# Patient Record
Sex: Female | Born: 1940 | Race: White | Hispanic: No | Smoking: Never smoker
Health system: Southern US, Community
[De-identification: ages and names within clinical notes are randomized; demographics above are authoritative.]

## PROBLEM LIST (undated history)

## (undated) DIAGNOSIS — J42 Unspecified chronic bronchitis: Secondary | ICD-10-CM

## (undated) DIAGNOSIS — R351 Nocturia: Secondary | ICD-10-CM

## (undated) DIAGNOSIS — R413 Other amnesia: Secondary | ICD-10-CM

## (undated) DIAGNOSIS — R197 Diarrhea, unspecified: Secondary | ICD-10-CM

## (undated) DIAGNOSIS — C3430 Malignant neoplasm of lower lobe, unspecified bronchus or lung: Secondary | ICD-10-CM

## (undated) DIAGNOSIS — M2540 Effusion, unspecified joint: Secondary | ICD-10-CM

## (undated) DIAGNOSIS — T4145XA Adverse effect of unspecified anesthetic, initial encounter: Secondary | ICD-10-CM

## (undated) DIAGNOSIS — M1990 Unspecified osteoarthritis, unspecified site: Secondary | ICD-10-CM

## (undated) DIAGNOSIS — M2550 Pain in unspecified joint: Secondary | ICD-10-CM

## (undated) DIAGNOSIS — C4491 Basal cell carcinoma of skin, unspecified: Secondary | ICD-10-CM

## (undated) DIAGNOSIS — N189 Chronic kidney disease, unspecified: Secondary | ICD-10-CM

## (undated) DIAGNOSIS — R112 Nausea with vomiting, unspecified: Secondary | ICD-10-CM

## (undated) DIAGNOSIS — Z8619 Personal history of other infectious and parasitic diseases: Secondary | ICD-10-CM

## (undated) DIAGNOSIS — G4700 Insomnia, unspecified: Secondary | ICD-10-CM

## (undated) DIAGNOSIS — R51 Headache: Secondary | ICD-10-CM

## (undated) DIAGNOSIS — R531 Weakness: Secondary | ICD-10-CM

## (undated) DIAGNOSIS — R918 Other nonspecific abnormal finding of lung field: Secondary | ICD-10-CM

## (undated) DIAGNOSIS — Z9889 Other specified postprocedural states: Secondary | ICD-10-CM

## (undated) DIAGNOSIS — Z8489 Family history of other specified conditions: Secondary | ICD-10-CM

## (undated) DIAGNOSIS — J189 Pneumonia, unspecified organism: Secondary | ICD-10-CM

## (undated) DIAGNOSIS — I10 Essential (primary) hypertension: Secondary | ICD-10-CM

## (undated) DIAGNOSIS — K5900 Constipation, unspecified: Secondary | ICD-10-CM

## (undated) DIAGNOSIS — G2581 Restless legs syndrome: Secondary | ICD-10-CM

## (undated) DIAGNOSIS — R42 Dizziness and giddiness: Secondary | ICD-10-CM

## (undated) DIAGNOSIS — C342 Malignant neoplasm of middle lobe, bronchus or lung: Secondary | ICD-10-CM

## (undated) HISTORY — DX: Headache: R51

## (undated) HISTORY — PX: DILATION AND CURETTAGE OF UTERUS: SHX78

## (undated) HISTORY — PX: ESOPHAGOGASTRODUODENOSCOPY: SHX1529

## (undated) HISTORY — PX: TUBAL LIGATION: SHX77

## (undated) HISTORY — DX: Unspecified osteoarthritis, unspecified site: M19.90

## (undated) HISTORY — DX: Other amnesia: R41.3

## (undated) HISTORY — DX: Restless legs syndrome: G25.81

## (undated) HISTORY — PX: BASAL CELL CARCINOMA EXCISION: SHX1214

## (undated) HISTORY — DX: Insomnia, unspecified: G47.00

---
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
## 1997-06-22 DIAGNOSIS — C342 Malignant neoplasm of middle lobe, bronchus or lung: Secondary | ICD-10-CM

## 1997-06-22 HISTORY — DX: Malignant neoplasm of middle lobe, bronchus or lung: C34.2

## 1997-08-20 DIAGNOSIS — C3430 Malignant neoplasm of lower lobe, unspecified bronchus or lung: Secondary | ICD-10-CM

## 1997-08-20 HISTORY — DX: Malignant neoplasm of lower lobe, unspecified bronchus or lung: C34.30

## 1997-08-20 HISTORY — PX: LUNG LOBECTOMY: SHX167

## 2003-01-21 DIAGNOSIS — T8859XA Other complications of anesthesia, initial encounter: Secondary | ICD-10-CM

## 2003-01-21 HISTORY — DX: Other complications of anesthesia, initial encounter: T88.59XA

## 2003-01-21 HISTORY — PX: CHOLECYSTECTOMY: SHX55

## 2003-01-25 ENCOUNTER — Encounter: Payer: Self-pay | Admitting: Emergency Medicine

## 2003-01-26 ENCOUNTER — Inpatient Hospital Stay (HOSPITAL_COMMUNITY): Admission: EM | Admit: 2003-01-26 | Discharge: 2003-01-29 | Payer: Self-pay

## 2003-01-26 ENCOUNTER — Encounter: Payer: Self-pay | Admitting: Internal Medicine

## 2003-01-28 ENCOUNTER — Encounter (INDEPENDENT_AMBULATORY_CARE_PROVIDER_SITE_OTHER): Payer: Self-pay | Admitting: *Deleted

## 2003-01-28 ENCOUNTER — Encounter: Payer: Self-pay | Admitting: General Surgery

## 2003-04-26 ENCOUNTER — Encounter: Admission: RE | Admit: 2003-04-26 | Discharge: 2003-04-26 | Payer: Self-pay | Admitting: Family Medicine

## 2004-08-25 ENCOUNTER — Ambulatory Visit: Payer: Self-pay | Admitting: Family Medicine

## 2005-03-20 ENCOUNTER — Ambulatory Visit: Payer: Self-pay | Admitting: Family Medicine

## 2005-04-10 ENCOUNTER — Ambulatory Visit: Payer: Self-pay | Admitting: Family Medicine

## 2006-04-23 ENCOUNTER — Ambulatory Visit: Payer: Self-pay | Admitting: Family Medicine

## 2006-08-20 ENCOUNTER — Ambulatory Visit: Payer: Self-pay | Admitting: Family Medicine

## 2006-08-20 LAB — CONVERTED CEMR LAB
ALT: 31 units/L (ref 0–40)
AST: 28 units/L (ref 0–37)
Albumin: 3.8 g/dL (ref 3.5–5.2)
Alkaline Phosphatase: 64 units/L (ref 39–117)
BUN: 23 mg/dL (ref 6–23)
Basophils Absolute: 0.1 10*3/uL (ref 0.0–0.1)
Basophils Relative: 1.3 % — ABNORMAL HIGH (ref 0.0–1.0)
Bilirubin, Direct: 0.1 mg/dL (ref 0.0–0.3)
CO2: 31 meq/L (ref 19–32)
Calcium: 9.2 mg/dL (ref 8.4–10.5)
Chloride: 102 meq/L (ref 96–112)
Cholesterol: 170 mg/dL (ref 0–200)
Creatinine, Ser: 0.7 mg/dL (ref 0.4–1.2)
Eosinophils Absolute: 0.2 10*3/uL (ref 0.0–0.6)
Eosinophils Relative: 2.9 % (ref 0.0–5.0)
GFR calc Af Amer: 108 mL/min
GFR calc non Af Amer: 90 mL/min
Glucose, Bld: 81 mg/dL (ref 70–99)
HCT: 41.4 % (ref 36.0–46.0)
HDL: 46.2 mg/dL (ref >39.0)
Hemoglobin: 13.8 g/dL (ref 12.0–15.0)
LDL Cholesterol: 101 mg/dL — ABNORMAL HIGH (ref 0–99)
Lymphocytes Relative: 30.4 % (ref 12.0–46.0)
MCHC: 33.3 g/dL (ref 30.0–36.0)
MCV: 93.9 fL (ref 78.0–100.0)
Monocytes Absolute: 0.5 10*3/uL (ref 0.2–0.7)
Monocytes Relative: 7.4 % (ref 3.0–11.0)
Neutro Abs: 3.6 10*3/uL (ref 1.4–7.7)
Neutrophils Relative %: 58 % (ref 43.0–77.0)
Platelets: 261 10*3/uL (ref 150–400)
Potassium: 3.8 meq/L (ref 3.5–5.1)
RBC: 4.4 M/uL (ref 3.87–5.11)
RDW: 13.2 % (ref 11.5–14.6)
Sodium: 137 meq/L (ref 135–145)
TSH: 1.11 microintl units/mL (ref 0.35–5.50)
Total Bilirubin: 0.6 mg/dL (ref 0.3–1.2)
Total CHOL/HDL Ratio: 3.7
Total Protein: 6.8 g/dL (ref 6.0–8.3)
Triglycerides: 113 mg/dL (ref 0–149)
VLDL: 23 mg/dL (ref 0–40)
WBC: 6.3 10*3/uL (ref 4.5–10.5)

## 2006-08-27 ENCOUNTER — Ambulatory Visit: Payer: Self-pay | Admitting: Family Medicine

## 2007-02-15 ENCOUNTER — Telehealth: Payer: Self-pay | Admitting: Family Medicine

## 2007-03-03 DIAGNOSIS — I10 Essential (primary) hypertension: Secondary | ICD-10-CM

## 2007-03-03 DIAGNOSIS — R51 Headache: Secondary | ICD-10-CM

## 2007-03-03 DIAGNOSIS — M545 Low back pain: Secondary | ICD-10-CM | POA: Insufficient documentation

## 2007-03-03 DIAGNOSIS — R519 Headache, unspecified: Secondary | ICD-10-CM | POA: Insufficient documentation

## 2007-03-25 ENCOUNTER — Ambulatory Visit: Payer: Self-pay | Admitting: Family Medicine

## 2007-06-22 ENCOUNTER — Ambulatory Visit: Payer: Self-pay | Admitting: Family Medicine

## 2007-06-22 DIAGNOSIS — R252 Cramp and spasm: Secondary | ICD-10-CM

## 2007-06-22 DIAGNOSIS — M1990 Unspecified osteoarthritis, unspecified site: Secondary | ICD-10-CM | POA: Insufficient documentation

## 2007-06-24 ENCOUNTER — Telehealth: Payer: Self-pay | Admitting: Family Medicine

## 2007-10-14 ENCOUNTER — Ambulatory Visit: Payer: Self-pay | Admitting: Family Medicine

## 2007-10-14 LAB — CONVERTED CEMR LAB
Bilirubin Urine: NEGATIVE
Ketones, urine, test strip: NEGATIVE
Nitrite: NEGATIVE
Protein, U semiquant: NEGATIVE
Specific Gravity, Urine: 1.02
Urobilinogen, UA: 0.2
pH: 5.5

## 2007-10-19 LAB — CONVERTED CEMR LAB
ALT: 52 units/L — ABNORMAL HIGH (ref 0–35)
AST: 42 units/L — ABNORMAL HIGH (ref 0–37)
Albumin: 3.6 g/dL (ref 3.5–5.2)
Alkaline Phosphatase: 62 units/L (ref 39–117)
BUN: 24 mg/dL — ABNORMAL HIGH (ref 6–23)
Basophils Absolute: 0 10*3/uL (ref 0.0–0.1)
Basophils Relative: 0 % (ref 0.0–1.0)
Bilirubin, Direct: 0.1 mg/dL (ref 0.0–0.3)
CO2: 29 meq/L (ref 19–32)
Calcium: 9.3 mg/dL (ref 8.4–10.5)
Chloride: 108 meq/L (ref 96–112)
Cholesterol: 167 mg/dL (ref 0–200)
Creatinine, Ser: 1 mg/dL (ref 0.4–1.2)
Eosinophils Absolute: 0.2 10*3/uL (ref 0.0–0.7)
Eosinophils Relative: 2.9 % (ref 0.0–5.0)
GFR calc Af Amer: 72 mL/min
GFR calc non Af Amer: 59 mL/min
Glucose, Bld: 87 mg/dL (ref 70–99)
HCT: 38.3 % (ref 36.0–46.0)
HDL: 41.4 mg/dL (ref >39.0)
Hemoglobin: 12.9 g/dL (ref 12.0–15.0)
LDL Cholesterol: 97 mg/dL (ref 0–99)
Lymphocytes Relative: 28.4 % (ref 12.0–46.0)
MCHC: 33.7 g/dL (ref 30.0–36.0)
MCV: 98.4 fL (ref 78.0–100.0)
Monocytes Absolute: 0.5 10*3/uL (ref 0.1–1.0)
Monocytes Relative: 6.7 % (ref 3.0–12.0)
Neutro Abs: 4.2 10*3/uL (ref 1.4–7.7)
Neutrophils Relative %: 62 % (ref 43.0–77.0)
Platelets: 239 10*3/uL (ref 150–400)
Potassium: 4.5 meq/L (ref 3.5–5.1)
RBC: 3.9 M/uL (ref 3.87–5.11)
RDW: 13.5 % (ref 11.5–14.6)
Sodium: 142 meq/L (ref 135–145)
TSH: 1.17 microintl units/mL (ref 0.35–5.50)
Total Bilirubin: 0.7 mg/dL (ref 0.3–1.2)
Total CHOL/HDL Ratio: 4
Total Protein: 6.6 g/dL (ref 6.0–8.3)
Triglycerides: 145 mg/dL (ref 0–149)
VLDL: 29 mg/dL (ref 0–40)
WBC: 6.8 10*3/uL (ref 4.5–10.5)

## 2007-10-21 ENCOUNTER — Telehealth: Payer: Self-pay | Admitting: Family Medicine

## 2007-10-21 ENCOUNTER — Ambulatory Visit: Payer: Self-pay | Admitting: Family Medicine

## 2007-10-21 DIAGNOSIS — M542 Cervicalgia: Secondary | ICD-10-CM | POA: Insufficient documentation

## 2007-10-31 ENCOUNTER — Telehealth: Payer: Self-pay | Admitting: Family Medicine

## 2007-11-01 ENCOUNTER — Telehealth: Payer: Self-pay | Admitting: Family Medicine

## 2007-11-03 ENCOUNTER — Ambulatory Visit: Payer: Self-pay | Admitting: Family Medicine

## 2007-11-04 ENCOUNTER — Encounter: Payer: Self-pay | Admitting: Family Medicine

## 2007-11-08 ENCOUNTER — Encounter: Admission: RE | Admit: 2007-11-08 | Discharge: 2007-11-08 | Payer: Self-pay | Admitting: Neurological Surgery

## 2007-11-10 ENCOUNTER — Telehealth: Payer: Self-pay | Admitting: Family Medicine

## 2007-11-24 ENCOUNTER — Encounter: Payer: Self-pay | Admitting: Family Medicine

## 2007-11-24 ENCOUNTER — Telehealth: Payer: Self-pay | Admitting: Family Medicine

## 2007-11-26 ENCOUNTER — Encounter: Admission: RE | Admit: 2007-11-26 | Discharge: 2007-11-26 | Payer: Self-pay | Admitting: Neurological Surgery

## 2007-12-05 ENCOUNTER — Encounter: Payer: Self-pay | Admitting: Family Medicine

## 2007-12-08 ENCOUNTER — Encounter: Payer: Self-pay | Admitting: Family Medicine

## 2008-01-10 ENCOUNTER — Telehealth: Payer: Self-pay | Admitting: Family Medicine

## 2008-01-17 ENCOUNTER — Ambulatory Visit: Payer: Self-pay | Admitting: Family Medicine

## 2008-02-10 ENCOUNTER — Telehealth: Payer: Self-pay | Admitting: Family Medicine

## 2008-03-23 ENCOUNTER — Ambulatory Visit: Payer: Self-pay | Admitting: Family Medicine

## 2008-04-27 ENCOUNTER — Telehealth: Payer: Self-pay | Admitting: Family Medicine

## 2008-04-30 DIAGNOSIS — M129 Arthropathy, unspecified: Secondary | ICD-10-CM

## 2008-05-23 ENCOUNTER — Encounter: Payer: Self-pay | Admitting: Family Medicine

## 2008-06-27 ENCOUNTER — Telehealth: Payer: Self-pay | Admitting: Family Medicine

## 2008-06-28 ENCOUNTER — Encounter: Payer: Self-pay | Admitting: Family Medicine

## 2008-07-12 ENCOUNTER — Encounter: Payer: Self-pay | Admitting: Family Medicine

## 2008-10-17 ENCOUNTER — Telehealth: Payer: Self-pay | Admitting: Family Medicine

## 2008-11-05 ENCOUNTER — Telehealth: Payer: Self-pay | Admitting: Family Medicine

## 2008-11-06 ENCOUNTER — Telehealth: Payer: Self-pay | Admitting: Family Medicine

## 2008-11-06 ENCOUNTER — Ambulatory Visit: Payer: Self-pay | Admitting: Family Medicine

## 2008-11-07 ENCOUNTER — Emergency Department (HOSPITAL_COMMUNITY): Admission: EM | Admit: 2008-11-07 | Discharge: 2008-11-07 | Payer: Self-pay | Admitting: Emergency Medicine

## 2008-11-07 ENCOUNTER — Telehealth: Payer: Self-pay | Admitting: Family Medicine

## 2009-01-20 HISTORY — PX: OTHER SURGICAL HISTORY: SHX169

## 2009-02-22 ENCOUNTER — Ambulatory Visit: Payer: Self-pay | Admitting: Family Medicine

## 2009-04-03 ENCOUNTER — Ambulatory Visit: Payer: Self-pay | Admitting: Family Medicine

## 2009-04-09 ENCOUNTER — Telehealth: Payer: Self-pay | Admitting: Family Medicine

## 2009-06-22 HISTORY — PX: CATARACT EXTRACTION W/ INTRAOCULAR LENS  IMPLANT, BILATERAL: SHX1307

## 2009-11-29 ENCOUNTER — Telehealth: Payer: Self-pay | Admitting: Family Medicine

## 2010-03-04 ENCOUNTER — Telehealth: Payer: Self-pay | Admitting: Family Medicine

## 2010-04-04 ENCOUNTER — Encounter: Payer: Self-pay | Admitting: Family Medicine

## 2010-04-28 ENCOUNTER — Ambulatory Visit: Payer: Self-pay | Admitting: Family Medicine

## 2010-04-28 ENCOUNTER — Encounter: Payer: Self-pay | Admitting: Family Medicine

## 2010-04-30 LAB — CONVERTED CEMR LAB
ALT: 30 units/L (ref 0–35)
AST: 30 units/L (ref 0–37)
Albumin: 4.3 g/dL (ref 3.5–5.2)
Alkaline Phosphatase: 83 units/L (ref 39–117)
BUN: 23 mg/dL (ref 6–23)
Basophils Absolute: 0 10*3/uL (ref 0.0–0.1)
Basophils Relative: 0.8 % (ref 0.0–3.0)
Bilirubin, Direct: 0.1 mg/dL (ref 0.0–0.3)
CO2: 29 meq/L (ref 19–32)
Calcium: 10.1 mg/dL (ref 8.4–10.5)
Chloride: 101 meq/L (ref 96–112)
Cholesterol: 196 mg/dL (ref 0–200)
Creatinine, Ser: 1.1 mg/dL (ref 0.4–1.2)
Eosinophils Absolute: 0.1 10*3/uL (ref 0.0–0.7)
Eosinophils Relative: 2.4 % (ref 0.0–5.0)
GFR calc non Af Amer: 54.16 mL/min (ref >60)
Glucose, Bld: 87 mg/dL (ref 70–99)
HCT: 40.3 % (ref 36.0–46.0)
HDL: 43.9 mg/dL (ref >39.00)
Hemoglobin: 13.6 g/dL (ref 12.0–15.0)
LDL Cholesterol: 128 mg/dL — ABNORMAL HIGH (ref 0–99)
Lymphocytes Relative: 38.1 % (ref 12.0–46.0)
Lymphs Abs: 2.1 10*3/uL (ref 0.7–4.0)
MCHC: 33.7 g/dL (ref 30.0–36.0)
MCV: 94.3 fL (ref 78.0–100.0)
Monocytes Absolute: 0.4 10*3/uL (ref 0.1–1.0)
Monocytes Relative: 7.8 % (ref 3.0–12.0)
Neutro Abs: 2.9 10*3/uL (ref 1.4–7.7)
Neutrophils Relative %: 50.9 % (ref 43.0–77.0)
Platelets: 234 10*3/uL (ref 150.0–400.0)
Potassium: 4.3 meq/L (ref 3.5–5.1)
RBC: 4.28 M/uL (ref 3.87–5.11)
RDW: 15 % — ABNORMAL HIGH (ref 11.5–14.6)
Sodium: 140 meq/L (ref 135–145)
TSH: 1.07 microintl units/mL (ref 0.35–5.50)
Total Bilirubin: 0.7 mg/dL (ref 0.3–1.2)
Total CHOL/HDL Ratio: 4
Total Protein: 7 g/dL (ref 6.0–8.3)
Triglycerides: 119 mg/dL (ref 0.0–149.0)
VLDL: 23.8 mg/dL (ref 0.0–40.0)
WBC: 5.6 10*3/uL (ref 4.5–10.5)

## 2010-05-29 ENCOUNTER — Encounter: Payer: Self-pay | Admitting: Family Medicine

## 2010-07-13 ENCOUNTER — Encounter: Payer: Self-pay | Admitting: Neurological Surgery

## 2010-07-20 LAB — CONVERTED CEMR LAB
ALT: 34 units/L (ref 0–35)
AST: 30 units/L (ref 0–37)
Albumin: 4.2 g/dL (ref 3.5–5.2)
Alkaline Phosphatase: 71 units/L (ref 39–117)
BUN: 26 mg/dL — ABNORMAL HIGH (ref 6–23)
Basophils Absolute: 0 10*3/uL (ref 0.0–0.1)
Basophils Relative: 0.6 % (ref 0.0–3.0)
Bilirubin, Direct: 0 mg/dL (ref 0.0–0.3)
CO2: 29 meq/L (ref 19–32)
Calcium: 9.9 mg/dL (ref 8.4–10.5)
Chloride: 104 meq/L (ref 96–112)
Cholesterol: 232 mg/dL — ABNORMAL HIGH (ref 0–200)
Creatinine, Ser: 1 mg/dL (ref 0.4–1.2)
Direct LDL: 141.1 mg/dL
Eosinophils Absolute: 0.2 10*3/uL (ref 0.0–0.7)
Eosinophils Relative: 3.3 % (ref 0.0–5.0)
GFR calc non Af Amer: 58.76 mL/min (ref >60)
Glucose, Bld: 91 mg/dL (ref 70–99)
HCT: 37 % (ref 36.0–46.0)
HDL: 34.5 mg/dL — ABNORMAL LOW (ref >39.00)
Hemoglobin: 12.9 g/dL (ref 12.0–15.0)
Lymphocytes Relative: 32.3 % (ref 12.0–46.0)
Lymphs Abs: 2 10*3/uL (ref 0.7–4.0)
MCHC: 34.9 g/dL (ref 30.0–36.0)
MCV: 92.8 fL (ref 78.0–100.0)
Monocytes Absolute: 0.5 10*3/uL (ref 0.1–1.0)
Monocytes Relative: 7.6 % (ref 3.0–12.0)
Neutro Abs: 3.4 10*3/uL (ref 1.4–7.7)
Neutrophils Relative %: 56.2 % (ref 43.0–77.0)
Platelets: 213 10*3/uL (ref 150.0–400.0)
Potassium: 4.4 meq/L (ref 3.5–5.1)
RBC: 3.99 M/uL (ref 3.87–5.11)
RDW: 13.6 % (ref 11.5–14.6)
Sodium: 141 meq/L (ref 135–145)
TSH: 1.12 microintl units/mL (ref 0.35–5.50)
Total Bilirubin: 0.7 mg/dL (ref 0.3–1.2)
Total CHOL/HDL Ratio: 7
Total Protein: 7.1 g/dL (ref 6.0–8.3)
Triglycerides: 211 mg/dL — ABNORMAL HIGH (ref 0.0–149.0)
VLDL: 42.2 mg/dL — ABNORMAL HIGH (ref 0.0–40.0)
WBC: 6.1 10*3/uL (ref 4.5–10.5)

## 2010-07-22 NOTE — Progress Notes (Signed)
Summary: refill maxide  Phone Note Call from Patient   Caller: Patient Call For: Nelwyn Salisbury MD Summary of Call: [phone] Pt. is asking for generic Maxide sent to [facility]).  Her [facility] has it on back order.  Needs it asap. Sent 30 day supply. Initial call taken by: Lynann Beaver CMA,  November 29, 2009 9:58 AM  Follow-up for Phone Call        call in #30 with 2 rf Follow-up by: Nelwyn Salisbury MD,  December 04, 2009 12:56 PM    Prescriptions: Joseph Pierini 37.5-25 MG TABS (TRIAMTERENE-HCTZ) 1 by mouth once daily  #30 x 0   Entered by:   Lynann Beaver CMA   Authorized by:   Nelwyn Salisbury MD   Signed by:   Lynann Beaver CMA on 11/29/2009   Method used:   [facility]        [facility]  [address] [phone][facility] ([facility]ail[facility]       [address] [place], [place]  [address]       Ph: [phone] or [phone]       Fax: [phone]   RxID:   [id number]

## 2010-07-22 NOTE — Progress Notes (Signed)
Summary: rx skelaxin   Phone Note From Pharmacy   [facility]er[facility]  Summary of Call: refill skelatin 800mg  qid as needed  next ov nov 2011 Initial call taken by: Pura Spice, RN,  March 04, 2010 10:27 AM  Follow-up for Phone Call        call in # 120 with 11 rf Follow-up by: Nelwyn Salisbury MD,  March 04, 2010 1:09 PM  Additional Follow-up for Phone Call Additional follow up Details #1::        ok pt wants mail [facility] Additional Follow-up by: Pura Spice, RN,  March 04, 2010 1:22 PM    New/Updated Medications: SKELAXIN 800 MG TABS (METAXALONE) 1 qid  as needed for  muscle spasm Prescriptions: SKELAXIN 800 MG TABS (METAXALONE) 1 qid  as needed for  muscle spasm  #360 x 3   Entered by:   Pura Spice, RN   Authorized by:   Nelwyn Salisbury MD   Signed by:   Pura Spice, RN on 03/04/2010   Method used:   Faxed to ...       [facility] ([facility]-order)       [address]       [place], [place]  [address]       Ph: [phone]       Fax: [phone]   RxID:   [id number]

## 2010-07-22 NOTE — Assessment & Plan Note (Signed)
Summary: PT WILL COME IN FASTING/NJR/pt req flu shot/cjr   Vital Signs:  Patient profile:   70 year old female Height:      64.5 inches Weight:      158 pounds BMI:     26.80 O2 Sat:      95 % Temp:     98.1 degrees F Pulse rate:   96 / minute BP sitting:   120 / 80  (left arm)  Vitals Entered By: Pura Spice, RN (April 28, 2010 9:01 AM) CC: cpx fasting  Is Patient Diabetic? No   History of Present Illness: 70 yr old female for a cpx. She feels good and has no concerns.   Allergies: 1)  ! Nsaids 2)  ! Pcn 3)  ! Ancef 4)  ! * Tape 5)  ! Hydrocodone-Homatropine (Hydrocodone-Homatropine) 6)  ! Darvocet 7)  ! Codeine 8)  ! * Contrast Dye  Past History:  Past Medical History: Chronic Neck Pain, saw Dr. Marikay Alar Low back pain DJD, sees Dr. Vicki Mallet Anxiety Depression, sees Dr. Rozanna Box, psychiatrist in [facility]. Sees Dr. Kittie Plater in [facility] for therapy Headache, sees Dr. 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 Dola Factor in [facility] sees Dr. Carlis Abbott in [facility] for gyn exams  restless legs, takes tonic water at hs insomnia DEXA normal 01-2009 sees Dr. Danella Deis for derm exams  Past Surgical History: right lower lobe Lobectomy Tubal ligation Tonsillectomy Cholecystectomy colonoscopy 6-03 per Dr. Dalene Carrow in [place], repeat in 10 yrs D and C in 02-2008 Cataract extraction, bilateral 2011 per Dr. Pecola Leisure in [place] removal of basal cell cancer from left upper back 2011 per Dr. Danella Deis  Past History:  Care Management: Gynecology: Dr Carlis Abbott Rheumatology: Dr Ethlyn Gallery Neurology: Dr Yetta Barre  Dermatology: Dr Danella Deis  Gastroenterology: Dr in [facility] does not remember name   Family History: Reviewed history from 02/22/2009 and no changes required. brother has MS Family History of Asthma Family History of CAD Female 1st degree relative <60 Family History of CAD Female 1st degree relative <50 Family History  Diabetes 1st degree relative Family History Hypertension Family History of Melanoma Family History Osteoporosis Family History Uterine cancer renal cancer  Social History: Reviewed history from 03/03/2007 and no changes required. Occupation: Married Never Smoked Alcohol use-no  Review of Systems  The patient denies anorexia, fever, weight loss, weight gain, vision loss, decreased hearing, hoarseness, chest pain, syncope, dyspnea on exertion, peripheral edema, prolonged cough, headaches, hemoptysis, abdominal pain, melena, hematochezia, severe indigestion/heartburn, hematuria, incontinence, genital sores, muscle weakness, suspicious skin lesions, transient blindness, difficulty walking, depression, unusual weight change, abnormal bleeding, enlarged lymph nodes, angioedema, breast masses, and testicular masses.         Flu Vaccine Consent Questions     Do you have a history of severe allergic reactions to this vaccine? no    Any prior history of allergic reactions to egg and/or gelatin? no    Do you have a sensitivity to the preservative Thimersol? no    Do you have a past history of Guillan-Barre Syndrome? no    Do you currently have an acute febrile illness? no    Have you ever had a severe reaction to latex? no    Vaccine information given and explained to patient? yes    Are you currently pregnant? no    Lot Number:[id number]   Exp Date:12/20/2010   Site Given  Left Deltoid IM Pura Spice,  RN  April 28, 2010 9:25 AM   Physical Exam  General:  Well-developed,well-nourished,in no acute distress; alert,appropriate and cooperative throughout examination Head:  Normocephalic and atraumatic without obvious abnormalities. No apparent alopecia or balding. Eyes:  No corneal or conjunctival inflammation noted. EOMI. Perrla. Funduscopic exam benign, without hemorrhages, exudates or papilledema. Vision grossly normal. Ears:  External ear exam shows no significant lesions or  deformities.  Otoscopic examination reveals clear canals, tympanic membranes are intact bilaterally without bulging, retraction, inflammation or discharge. Hearing is grossly normal bilaterally. Nose:  External nasal examination shows no deformity or inflammation. Nasal mucosa are pink and moist without lesions or exudates. Mouth:  Oral mucosa and oropharynx without lesions or exudates.  Teeth in good repair. Neck:  No deformities, masses, or tenderness noted. Chest Wall:  No deformities, masses, or tenderness noted. Lungs:  Normal respiratory effort, chest expands symmetrically. Lungs are clear to auscultation, no crackles or wheezes. Heart:  Normal rate and regular rhythm. S1 and S2 normal without gallop, murmur, click, rub or other extra sounds. EKG normal  Abdomen:  Bowel sounds positive,abdomen soft and non-tender without masses, organomegaly or hernias noted. Msk:  No deformity or scoliosis noted of thoracic or lumbar spine.   Pulses:  R and L carotid,radial,femoral,dorsalis pedis and posterior tibial pulses are full and equal bilaterally Extremities:  No clubbing, cyanosis, edema, or deformity noted with normal full range of motion of all joints.   Neurologic:  No cranial nerve deficits noted. Station and gait are normal. Plantar reflexes are down-going bilaterally. DTRs are symmetrical throughout. Sensory, motor and coordinative functions appear intact. Skin:  Intact without suspicious lesions or rashes Cervical Nodes:  No lymphadenopathy noted Axillary Nodes:  No palpable lymphadenopathy Inguinal Nodes:  No significant adenopathy Psych:  Cognition and judgment appear intact. Alert and cooperative with normal attention span and concentration. No apparent delusions, illusions, hallucinations   Impression & Recommendations:  Problem # 1:  WELL ADULT EXAM (ICD-V70.0)  Orders: EKG w/ Interpretation (93000) UA Dipstick w/o Micro (automated)  (81003) Venipuncture ([address]) TLB-Lipid Panel  (80061-LIPID) TLB-BMP (Basic Metabolic Panel-BMET) (80048-METABOL) TLB-CBC Platelet - w/Differential (85025-CBCD) TLB-Hepatic/Liver Function Pnl (80076-HEPATIC) TLB-TSH (Thyroid Stimulating Hormone) (84443-TSH) Specimen Handling ([address])  Complete Medication List: 1)  Maxzide-25 37.5-25 Mg Tabs (Triamterene-hctz) .Marland Kitchen.. 1 by mouth once daily 2)  Skelaxin 800 Mg Tabs (Metaxalone) .Marland Kitchen.. 1 qid  as needed for  muscle spasm 3)  Klor-con M20 20 Meq Tbcr (Potassium chloride crys cr) .Marland Kitchen.. 1 by mouth once daily 4)  Lisinopril 20 Mg Tabs (Lisinopril) .Marland Kitchen.. 1 by mouth once daily 5)  Multivitamins Tabs (Multiple vitamin) .Marland Kitchen.. 1 by mouth once daily 6)  Fish Oil Oil (Fish oil) .... Once daily 7)  Zinc Magnesium Aspartate 150-3.83-10 Mg Caps (Zinc-magnesium aspart-vit b6) .Marland Kitchen.. 1 by mouth once daily 8)  Calcium 500/d 500-400 Mg-unit Chew (Calcium-vitamin d) .... Two times a day 9)  Vitamin C 500 Mg Chew (Ascorbic acid) .... Once daily 10)  Bl Vitamin E 400 Unit Caps (Vitamin e) .... Once daily 11)  Vitamin-b Complex Tabs (B complex vitamins) .... Once daily 12)  Mobic 15 Mg Tabs (Meloxicam) .... One daily 13)  Temazepam 30 Mg Caps (Temazepam) .Marland Kitchen.. 1 at bedtime prn 14)  Cyclobenzaprine Hcl 10 Mg Tabs (Cyclobenzaprine hcl) .... Once daily  Other Orders: Admin 1st Vaccine (09811) Flu Vaccine 25yrs + (91478) Tdap => 21yrs IM (29562) Admin of Any Addtl Vaccine (13086)  Patient Instructions: 1)  get labs today  Prescriptions: CYCLOBENZAPRINE HCL  10 MG TABS (CYCLOBENZAPRINE HCL) once daily  #90 x 3   Entered and Authorized by:   Nelwyn Salisbury MD   Signed by:   Nelwyn Salisbury MD on 04/28/2010   Method used:   Print then Give to Patient   RxID:   [id number] MOBIC 15 MG  TABS (MELOXICAM) one daily  #90 x 3   Entered and Authorized by:   Nelwyn Salisbury MD   Signed by:   Nelwyn Salisbury MD on 04/28/2010   Method used:   Print then Give to Patient   RxID:   [id number] LISINOPRIL 20 MG TABS (LISINOPRIL)  1 by mouth once daily  #90 x 3   Entered and Authorized by:   Nelwyn Salisbury MD   Signed by:   Nelwyn Salisbury MD on 04/28/2010   Method used:   Print then Give to Patient   RxID:   [id number] KLOR-CON M20 20 MEQ TBCR (POTASSIUM CHLORIDE CRYS CR) 1 by mouth once daily  #90 x 3   Entered and Authorized by:   Nelwyn Salisbury MD   Signed by:   Nelwyn Salisbury MD on 04/28/2010   Method used:   Print then Give to Patient   RxID:   [id number] 37.5-25 MG TABS (TRIAMTERENE-HCTZ) 1 by mouth once daily  #90 x 3   Entered and Authorized by:   Nelwyn Salisbury MD   Signed by:   Nelwyn Salisbury MD on 04/28/2010   Method used:   Print then Give to Patient   RxID:   [id number] 37.5-25 MG TABS (TRIAMTERENE-HCTZ) 1 by mouth once daily  #30 x 0   Entered and Authorized by:   Nelwyn Salisbury MD   Signed by:   Nelwyn Salisbury MD on 04/28/2010   Method used:   [facility]        [facility]  [address] [phone][facility]       [address] [place], [place]  [address]       Ph: [phone] or [phone]       Fax: [phone]   RxID:   [id number]    Orders Added: 1)  Admin 1st Vaccine [90471] 2)  Flu Vaccine 35yrs + [81829] 3)  Tdap => 54yrs IM [93716] 4)  Admin of Any Addtl Vaccine [90472] 5)  Est. Patient 65& > [99397] 6)  EKG w/ Interpretation [93000] 7)  UA Dipstick w/o Micro (automated)  [81003] 8)  Venipuncture [36415] 9)  TLB-Lipid Panel [80061-LIPID] 10)  TLB-BMP (Basic Metabolic Panel-BMET) [80048-METABOL] 11)  TLB-CBC Platelet - w/Differential [85025-CBCD] 12)  TLB-Hepatic/Liver Function Pnl [80076-HEPATIC] 13)  TLB-TSH (Thyroid Stimulating Hormone) [84443-TSH] 14)  Specimen Handling [99000]   Immunizations Administered:  Tetanus Vaccine:    Vaccine Type: Tdap    Site: right deltoid    Mfr: GlaxoSmithKline    Dose: 0.5 ml    Route: IM    Given by: Pura Spice, RN    Exp. Date: 04/10/2012    Lot #: [id number]    VIS given: 05/09/08  version given April 28, 2010.   Immunizations Administered:  Tetanus Vaccine:    Vaccine Type: Tdap    Site: right deltoid    Mfr: GlaxoSmithKline    Dose: 0.5 ml    Route: IM    Given by: Pura Spice, RN    Exp. Date:  04/10/2012    Lot #: [id number]    VIS given: 05/09/08 version given April 28, 2010.  Appended Document: Orders Update    Clinical Lists Changes  Observations: Added new observation of COMMENTS: Wynona Canes, CMA  April 28, 2010 12:35 PM  (04/28/2010 12:35) Added new observation of PH URINE: 7.5  (04/28/2010 12:35) Added new observation of SPEC GR URIN: 1.020  (04/28/2010 12:35) Added new observation of APPEARANCE U: Clear  (04/28/2010 12:35) Added new observation of UA COLOR: yellow  (04/28/2010 12:35) Added new observation of WBC DIPSTK U: negative  (04/28/2010 12:35) Added new observation of NITRITE URN: negative  (04/28/2010 12:35) Added new observation of UROBILINOGEN: 0.2  (04/28/2010 12:35) Added new observation of PROTEIN, URN: negative  (04/28/2010 12:35) Added new observation of BLOOD UR DIP: negative  (04/28/2010 12:35) Added new observation of KETONES URN: negative  (04/28/2010 12:35) Added new observation of BILIRUBIN UR: negative  (04/28/2010 12:35) Added new observation of GLUCOSE, URN: negative  (04/28/2010 12:35)      Laboratory Results   Urine Tests  Date/Time Recieved: April 28, 2010 12:35 PM  Date/Time Reported: April 28, 2010 12:35 PM   Routine Urinalysis   Color: yellow Appearance: Clear Glucose: negative   (Normal Range: Negative) Bilirubin: negative   (Normal Range: Negative) Ketone: negative   (Normal Range: Negative) Spec. Gravity: 1.020   (Normal Range: 1.003-1.035) Blood: negative   (Normal Range: Negative) pH: 7.5   (Normal Range: 5.0-8.0) Protein: negative   (Normal Range: Negative) Urobilinogen: 0.2   (Normal Range: 0-1) Nitrite: negative   (Normal Range: Negative) Leukocyte Esterace:  negative   (Normal Range: Negative)    Comments: Wynona Canes, CMA  April 28, 2010 12:35 PM

## 2010-07-24 NOTE — Letter (Signed)
[facility]   Imported By: Maryln Gottron 06/06/2010 12:42:00  _____________________________________________________________________  External Attachment:    Type:   Image     Comment:   External Document

## 2010-09-30 LAB — POCT I-STAT, CHEM 8
BUN: 28 mg/dL — ABNORMAL HIGH (ref 6–23)
Calcium, Ion: 1.23 mmol/L (ref 1.12–1.32)
Chloride: 106 mEq/L (ref 96–112)
Creatinine, Ser: 1.1 mg/dL (ref 0.4–1.2)
Glucose, Bld: 120 mg/dL — ABNORMAL HIGH (ref 70–99)
HCT: 38 % (ref 36.0–46.0)
Hemoglobin: 12.9 g/dL (ref 12.0–15.0)
Potassium: 3.9 mEq/L (ref 3.5–5.1)
Sodium: 140 mEq/L (ref 135–145)
TCO2: 25 mmol/L (ref 0–100)

## 2010-09-30 LAB — URINE CULTURE

## 2010-09-30 LAB — CBC
HCT: 35 % — ABNORMAL LOW (ref 36.0–46.0)
Hemoglobin: 12.1 g/dL (ref 12.0–15.0)
MCHC: 34.6 g/dL (ref 30.0–36.0)
MCV: 94.1 fL (ref 78.0–100.0)
Platelets: 221 10*3/uL (ref 150–400)
RBC: 3.72 MIL/uL — ABNORMAL LOW (ref 3.87–5.11)
RDW: 14.2 % (ref 11.5–15.5)
WBC: 12.7 10*3/uL — ABNORMAL HIGH (ref 4.0–10.5)

## 2010-09-30 LAB — POCT CARDIAC MARKERS
CKMB, poc: <1 ng/mL — ABNORMAL LOW (ref 1.0–8.0)
Myoglobin, poc: 77.9 ng/mL (ref 12–200)
Troponin i, poc: <0.05 ng/mL (ref 0.00–0.09)

## 2010-11-07 NOTE — H&P (Signed)
[name], [name] NO.:  [id number]   MEDICAL RECORD NO.:  [id number]                   PATIENT TYPE:  EMS   LOCATION:  MAJO                                 FACILITY:  [facility]   PHYSICIAN:  Corwin Levins, M.D. [facility]             DATE OF BIRTH:  09/23/1940   DATE OF ADMISSION:  01/25/2003  DATE OF DISCHARGE:                                HISTORY & PHYSICAL   CHIEF COMPLAINT:  Chest pain and epigastric to right upper quadrant pain  associated with nausea, vomiting, and syncope earlier today.   HISTORY OF PRESENT ILLNESS:  Karen Watson is a 70 year old white female with  sudden onset of lower substernal chest pain as well as epigastric to right  upper quadrant discomfort starting about 2:30 p.m. which was about two hours  after lunch with potato salad which otherwise tasted okay to her.  She drove  home from work about 4:30 p.m. 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  She was able to get home.  Thereafter, nausea and vomiting started about 6 p.m.  Husband at home,  called EMS.  She was brought to ER weak and clammy and nauseous with pain,  treated with Phenergan, sublingual nitroglycerin, and was noted to have  bradycardia at that time.  She now complains of ongoing discomfort, not  helped with medications so far, primarily in the right upper quadrant with  radiation to the back associated with some nausea.   ALLERGIES:  No known drug allergies.   CURRENT MEDICATIONS:  1. Home O2 (p.r.n.).  2. Skelaxin 400 mg p.r.n.  3. Triamterene/hydrochlorothiazide 37.5/25 one p.o. daily.  4. Estradiol 1 mg daily.   PAST MEDICAL HISTORY:  1. Hypertension.  2. History of lung cancer, status post right lower lobe lobectomy.  3. DJD.  4. Postmenopausal.   PAST SURGICAL HISTORY:  Status post T&A, otherwise as above.   SOCIAL HISTORY:  No tobacco,  no alcohol.  She is married and lives with her  husband.   FAMILY HISTORY:  Significant for heart disease, hypertension, high  cholesterol, also mother with renal cancer and colon cancer and several  TIAs.   REVIEW OF SYSTEMS:  Otherwise noncontributory.   PHYSICAL EXAMINATION:  GENERAL APPEARANCE:  Karen Watson is a very nice 70-year-  old white female, alert and appropriate.  VITAL SIGNS:  Blood pressure 147/63, heart rate 56, temperature 99.1, O2  saturation 100% on two liters.  HEENT:  Sclerae are clear.  TMs are clear.  Pharynx is benign.  Normocephalic and atraumatic.  NECK:  No lymphadenopathy, JVD, thyromegaly.  CHEST:  No rales or wheezing.  CARDIOVASCULAR:  Regular rate and rhythm, somewhat distant.  No murmur.  No  chest wall tenderness noted.  ABDOMEN:  Soft, positive bowel sounds, no  organomegaly.  There was some  right upper quadrant epigastric tenderness, worse in the right upper  quadrant, however, and rather marked.  EXTREMITIES:  No edema.   LABORATORY DATA:  CPK/MB 1.7 with troponin I 0.05.  There is an ISTAT  creatinine, LFTs, lipase pending at the time of this dictation.   ECG with sinus bradycardia, 53 beats per minute, no acute changes.   Chest x-ray with atelectasis only.   ASSESSMENT AND PLAN:  1. Epigastric to right upper quadrant discomfort radiating to the back     associated with nausea and vomiting, most likely consistent with acute     cholecystectomy but cannot rule out gastritis, food poisoning, bowel     obstruction, pancreatitis.  There are some labs pending.  Will need to     admit, make NPO, give IV fluids, Phenergan, pain medications, check     abdominal films.  Consider general surgery versus gastroenterology     consult.  Start empiric antibiotics.  Check abdominal ultrasound to     follow-up gallbladder.  2. Syncope.  Most likely vagal, given nausea and vomiting, and decreased     heart rate.  Will check telemetry, rule out myocardial  infarction with     serial cardiac enzymes.  We need to consider 2-D echo or cardiology     consult.  3. Hypertension.  Hold diuretic for now but doubt this is the cause of her     syncope.  4. History of lung cancer remotely.  No known recurrence, but consider head     CT with syncope.                                                Corwin Levins, M.D. [facility]    JWJ/MEDQ  D:  01/25/2003  T:  01/26/2003  Job:  [id number]   cc:   Jeannett Senior A. Clent Ridges, M.D. [facility]

## 2010-11-07 NOTE — Discharge Summary (Signed)
NAMEAMORI, COOPERMAN NO.:  [id number]   MEDICAL RECORD NO.:  [id number]                   PATIENT TYPE:  INP   LOCATION:  5013                                 FACILITY:  [facility]   PHYSICIAN:  Rene Paci, M.D. [facility]          DATE OF BIRTH:  09/23/1940   DATE OF ADMISSION:  01/26/2003  DATE OF DISCHARGE:  01/29/2003                                 DISCHARGE SUMMARY   DISCHARGE DIAGNOSES:  1. Right upper quadrant pain.  2. Nausea and vomiting.  3. Vasovagal syncope.  4. Cholecystitis.   BRIEF ADMISSION HISTORY:  Ms. Metzer is a 70 year old white female who  presented with the sudden onset of substernal chest pain, epigastric pain,  and right upper quadrant pain around 3:30 p.m.  This occurred two hours  after eating lunch.  On driving home around 2:13 that afternoon, she became  dizzy.  She was parked in her car when she lost consciousness for maybe two  minutes.  She was able to get home.  She then developed nausea and vomiting.  Her husband then called EMS and she was transported to the emergency room.  At that time, she was found to be weak and clammy and nauseated.  Reportedly, en route to the hospital, the patient had a bradycardic episode  and was given some Atropine.   PAST MEDICAL HISTORY:  1. Hypertension.  2. Postmenopausal.  3. Degenerative joint disease.  4. Status post T&A.  5. History of lung cancer, status post right lower lobectomy, this was in     1999.  She still has a 1-2 cm nodule in the right lung that is stable by     CT, she does have q.6 month CT scans of her chest as well as followup     with her oncologist in [place].   HOSPITAL COURSE:  Problem 1. Gastrointestinal.  The patient presented with  epigastric pain, right upper quadrant pain, and nausea and vomiting.  Her  KUB revealed a 4 x 3 cm soft tissue mass in the right upper quadrant.  This  was followed by an abdominal ultrasound that revealed dilated ducts  with a  common bile duct of 9-10 mm but no gallstones.  Her SGOT was 115 and SGPT  was 63, otherwise LFTs were normal.  We followed the ultrasound results with  a CT of the abdomen that was consistent with acute cholecystitis and no  evidence of mass or adenopathy.  Surgery was asked to see the patient, she  underwent a laparoscopic cholecystectomy with a negative IOC on 01/28/03.  The  patient's condition has improved.  Her LFTs are normalizing, her nausea and  vomiting have resolved.  She does have some residual soreness but no pain.  She is currently tolerating a diet.  It is felt the patient is stable for  discharge home.   Problem 2. Syncope.  The patient had a  syncopal episode with bradycardia,  the patient was admitted to telemetry and ruled out for a myocardial  infarction.  Serial cardiac enzymes were negative, EKG was without ischemia.  The patient had no further bradycardia or sinus pauses.  It was felt that  more than likely her syncope was vasovagal.  The patient had no further  episodes.   Problem 3. Infectious disease.  The patient was empirically started on  Unasyn and did receive three days of Unasyn but has remained afebrile with a  normal white count.   LABORATORY DATA AT DISCHARGE:  SGOT 61, SGPT 98, otherwise LFTs are normal.  CEA was less than 0.5.  Amylase and lipase are normal.  Coags are normal.  CBC with differential is normal.  Total cholesterol 163, triglycerides 101,  HDL 48, LDL 95.   DISCHARGE MEDICATIONS:  1. HCTZ/triamterine 25/37.5 daily.  2. Estradiol and progesterone as at home.  3. Vicodin 1-2 tablets q.4-6h. p.r.n.   FOLLOW UP:  Followup with Dr. Clent Ridges as scheduled and Dr. Carolynne Edouard as instructed.      Cornell Barman, P.A. [facility]                  Rene Paci, M.D. [facility]    LC/MEDQ  D:  01/29/2003  T:  01/29/2003  Job:  [id number]   cc:   Tera Mater. Clent Ridges, M.D. [facility]   Dr. Krystal Eaton, [place] (Oncology)   Dr. Carolynne Edouard

## 2010-11-07 NOTE — Op Note (Signed)
NAMEMALIYA, MARICH NO.:  [id number]   MEDICAL RECORD NO.:  [id number]                   PATIENT TYPE:  INP   LOCATION:  5013                                 FACILITY:  [facility]   PHYSICIAN:  Ollen Gross. Vernell Morgans, M.D.              DATE OF BIRTH:  09/23/1940   DATE OF PROCEDURE:  02/11/2003  DATE OF DISCHARGE:  01/29/2003                                 OPERATIVE REPORT   PREOPERATIVE DIAGNOSES:  Gallstone pancreatitis.   POSTOPERATIVE DIAGNOSES:  Gallstone pancreatitis.   OPERATION PERFORMED:  Laparoscopic cholecystectomy with intraoperative  cholangiogram.   SURGEON:  Ollen Gross. Carolynne Edouard, M.D.   ANESTHESIA:  General endotracheal.   DESCRIPTION OF PROCEDURE:  After informed consent was obtained, the patient  was brought to the operating room and placed in supine position on the  operating table.  After adequate induction of general anesthesia, the  patient's abdomen was prepped with Betadine and draped in the usual sterile  manner.  The area below the umbilicus was infiltrated with 0.25% Marcaine.  A small incision was made with a 15 blade knife.  This incision was carried  down through the subcutaneous tissue bluntly with the Kelly clamp and [employer] retractors until the linea alba was identified.  The linea alba was  incised with a 15 blade knife and each side was grasped with Kocher clamps  and elevated anteriorly.  The preperitoneal space was then probed bluntly  with a hemostat until the peritoneum was opened and access was gained to the  abdominal cavity.  A 0 Vicryl pursestring stitch was then placed in the  fascia surrounding the opening.  A Hasson cannula was placed through the  opening and anchored in place with the previously placed Vicryl pursestring  stitch.  The abdomen was then insufflated with carbon dioxide without  difficulty.  The patient was placed in a head up position.  The laparoscope  was placed through the Hasson cannula and  the right upper quadrant was  inspected.  The dome of the gallbladder and the liver were readily  identified.  The epigastric region was then infiltrated with 0.25% Marcaine.  A small incision was made with a 15 blade knife and a 10 mm port was placed  bluntly through this incision into the abdominal cavity under direct vision.  Sites were then chosen laterally on the right side of the abdomen for  placement of 5 mm ports. Each of these areas was infiltrated with 0.25%  Marcaine.  Small stab incisions were made with a 15 blade knife.  5 mm ports  were placed bluntly through these incisions into the abdominal cavity under  direct vision.  A blunt grasper was placed through the lateral most 5 mm  port and used to grasp the dome of the gallbladder and elevate it anteriorly  and superiorly.  Another blunt grasper was then placed through the  other 5  mm port and used to retract on the body and neck of the gallbladder.  A  dissector was placed through the epigastric port and using the  electrocautery, the peritoneal reflection at the gallbladder neck was  opened.  Blunt dissection was then carried out in this area until the  gallbladder neck cystic duct junction was readily identified and a good  window was created.  A single clip was placed on the gallbladder neck.  A  small ductotomy was made.  A 14 gauge Angiocath was placed percutaneously  through the anterior abdominal wall under direct vision.  A Reddick  cholangiogram catheter was placed through the Angiocath and flushed.  The  Reddick catheter was then placed within the cystic duct and anchored in  place with a clip.  A cholangiogram was obtained that showed no filling  defects, good emptying into the duodenum and adequate length on the cystic  duct.  The anchoring clip and catheter was then removed from the patient.  Three clips were placed proximally around the cystic duct and the duct was  divided between the two sets of clips.  Closer  to this, the cystic artery  was identified and again dissected in a circumferential manner bluntly until  a good window was created.  Two clips were placed proximally, one distally  on the artery and the artery was divided between the two.  Next, a  laparoscopic hook cautery device was used to separate the gallbladder from  the liver bed prior to complete detaching the gallbladder from the liver  bed.  The liver bed was inspected and several small bleeding points were  coagulated with electrocautery until the liver bed was completely  hemostatic.  The gallbladder was then detached the rest of the way from the  liver bed without difficulty.  The laparoscope was then placed through the  epigastric port.  The gallbladder grasper was placed through the Hasson  cannula and used to grasp the neck of the gallbladder.  The gallbladder was  then removed with the Hasson cannula through the infraumbilical port without  difficulty.  The fascial defect was closed with the previously placed Vicryl  pursestring suture stitch.  The abdomen was then irrigated with copious  amounts of saline until the effluent was clear.  The liver bed was inspected  again and found to be hemostatic.  The ports were then all removed under  direct vision and all were hemostatic.  Gas was allowed to escape.  The skin  incisions were all closed with interrupted 4-0 Monocryl subcuticular  stitches.  Benzoin, Steri-Strips and sterile dressings were applied.  The  patient tolerated the procedure well.  At the end of the case all sponge,  needle and instrument counts were correct.  The patient was awakened and  taken to the recovery room in stable condition.                                                    Ollen Gross. Vernell Morgans, M.D.    PST/MEDQ  D:  02/11/2003  T:  02/12/2003  Job:  [id number]

## 2010-11-07 NOTE — Consult Note (Signed)
NAMEREKISHA, WELLING NO.:  [id number]   MEDICAL RECORD NO.:  [id number]                   PATIENT TYPE:  INP   LOCATION:  6526                                 FACILITY:  [facility]   PHYSICIAN:  Althea Grimmer. Luther Parody, M.D.            DATE OF BIRTH:  09/23/1940   DATE OF CONSULTATION:  01/27/2003  DATE OF DISCHARGE:                                   CONSULTATION   REFERRING PHYSICIAN:  Dr. Ollen Gross. Carolynne Edouard.   REASON FOR CONSULTATION:  Ms. Schmieder is a 70 year old female whom I am asked  to see by Dr. Carolynne Edouard for epigastric discomfort, nausea, vomiting and abnormal  CT scan and abnormal liver function tests.  She was admitted to the hospital  yesterday after suffering acute severe epigastric pain with syncope.  The  pain may have been located somewhat to the right of midline and did radiate  to her back somewhat.  The pain was colicky and came and went.  With her  second episode, she had nausea and vomiting and after initial vomiting,  there was a little streaking of blood in the vomitus.  Pain was mostly  located in the substernal and epigastric area.  Myocardial infarction has  been ruled out.  She says currently that she has had no pain since yesterday  but now her stool is somewhat loose.  She reports that she has had an  extensive gastrointestinal workup at [facility] in [facility].  She has  a history there of having a lung cancer resected and underwent chemotherapy  in followup; currently, she is in reported remission.  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  She underwent upper and lower endoscopies  in July of last year.  There was some nodularity of the second and third  duodenum, but biopsies were insignificant.  She also underwent a small-bowel  series, which was normal.  Colonoscopy was entirely normal.  On this  admission, she had an ultrasonogram that suggested her common bile duct was  9 to 10 mm and a CT scan that suggested a dilated  gallbladder with mildly  thickened wall and mild pericholecystic fluid suggestive of acute  cholecystitis.  On CT scan, the common bile duct was not reported to be  larger than 8 mm.  She has had no melena.   PAST MEDICAL HISTORY:  Past medical history is pertinent for hypertension,  lung cancer with right lower lobe lobectomy, postmenopausal state and  osteoarthritis.   CURRENT MEDICATIONS:  Unasyn at home, Skelaxin, hormone replacement therapy  and Maxzide.  She does use home oxygen rarely if needed.   ALLERGIES:  None reported.   FAMILY HISTORY:  She has had relatives with kidney and colon cancer.   SOCIAL HISTORY:  She has never smoked.  She does not drink alcohol.   REVIEW OF SYSTEMS:  GENERAL:  No weight loss or night sweats.  ENDOCRINE:  No history of diabetes or thyroid problems.  SKIN:  No rash or pruritus.  EYES:  No icterus or change in vision.  ENT:  No aphthous ulcers or chronic  sore throat.  RESPIRATORY:  Mild exertional dyspnea, see above.  CARDIAC:  See above.  GI:  See above.  Remainder of review of systems is negative.   PHYSICAL EXAMINATION:  GENERAL:  On physical exam, she is a well-developed,  well-nourished adult female in no acute distress.  VITAL SIGNS:  Afebrile.  Blood pressure 125/55.  Pulse is 84 and regular.  SKIN:  Skin is normal.  EYES:  Anicteric.  Oropharynx unremarkable.  NECK:  Neck is supple without thyromegaly.  There is no cervical or inguinal  adenopathy.  CHEST:  Chest sounds clear.  HEART:  Heart sounds regular rate and rhythm.  ABDOMEN:  Abdomen is soft without mass, tenderness or organomegaly at this  time.  RECTAL:  Exam not performed.  EXTREMITIES:  Extremities without cyanosis, clubbing, edema or rash.   LABORATORY TESTS:  Laboratory tests reveal a hemoglobin of 13.6, white blood  count 6.8, platelet count 240,000.  Prothrombin time is normal.  SGOT is  115, SGPT has risen from 63 to 143, total bilirubin 0.7, albumin 3.2, lipase   36.   IMPRESSION:  [age]-year-old female with symptoms and CT scan that could  be compatible with acute acalculous cholecystitis.  She may have passed a  common bile duct stone or may have choledocholithiasis not seen on imaging  studies.  Currently, she appears to feel very well and her exam is  unremarkable.  I do not think she requires upper endoscopic evaluation, in  light of her essentially negative endoscopy last year.  Laparoscopic  cholecystectomy is a reasonable option and unless she has progressive  problems or her liver function tests reflect further obstruction tomorrow, I  do not think she would require a preoperative endoscopic retrograde  cholangiopancreatogram.   PLAN:  Observe the patient on antibiotics.  Consider laparoscopic  cholecystectomy.  Check liver function in the morning.  If there are  worsening transaminases or obstructive LFTs, I will schedule an ERCP.                                               Althea Grimmer. Luther Parody, M.D.    PJS/MEDQ  D:  01/27/2003  T:  01/27/2003  Job:  [id number]   cc:   Corwin Levins, M.D. [facility]   Ollen Gross. Vernell Morgans, M.D.  [address]  [place]  [place] [address]  Fax: [phone]

## 2010-11-07 NOTE — Consult Note (Signed)
NAMERANESSA, Karen Watson NO.:  [id number]   MEDICAL RECORD NO.:  [id number]                   PATIENT TYPE:  INP   LOCATION:  6526                                 FACILITY:  [facility]   PHYSICIAN:  Ollen Gross. Vernell Morgans, M.D.              DATE OF BIRTH:  09/23/1940   DATE OF CONSULTATION:  01/26/2003  DATE OF DISCHARGE:                                   CONSULTATION   REFERRING PHYSICIAN:  Dr. Corwin Levins.   REASON FOR CONSULTATION:  Karen Watson is a 70 year old white female who  presented yesterday with a one-day history of epigastric, right upper  quadrant and substernal chest pain.  She felt a little bit short of breath;  she also had several episodes of nausea and vomiting.  She denied any fevers  or chills.  The pain, she stated, also radiated into her back.  Her pain has  since resolved for the most part.  As part of her workup, she underwent an  ultrasound which was unremarkable except for some common bile duct  dilatation.  She then underwent a CT scan that showed some mild thickening  of the gallbladder wall possibly consistent with some cholecystitis and a  dilated common bile duct, but no stones were identified, no pancreatic tumor  identified.  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  She, otherwise, has been in reasonably good health.  The  rest of her review of systems are pretty unremarkable.   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   PAST SURGICAL HISTORY:  Past surgical history includes a right lower  lobectomy, tonsillectomy, upper and lower endoscopies.   MEDICATIONS:  Medications include Skelaxin p.r.n., hydrochlorothiazide,  triamterine and estradiol.   ALLERGIES:  No known drug allergies.   SOCIAL HISTORY:  She denies any use of alcohol  or tobacco products.   FAMILY HISTORY:  Family history is noncontributory.   PHYSICAL EXAMINATION:  VITAL SIGNS:  On physical exam, she is currently  afebrile with a temperature of 97.3, blood pressure is 125/40, pulse of 70.  Room air saturation is 97%.  GENERAL:  In general, she is a well-developed, well-nourished white female  in no acute distress.  SKIN:  Her skin is warm and dry with no jaundice.  EYES:  Her extraocular muscles are intact.  Pupils are equally round and  react to light.  Sclerae are nonicteric.  NECK:  The neck has no bruits.  I cannot palpate any thyroid masses.  Trachea is midline.  LUNGS:  Lungs are clear bilaterally with no use of accessory respiratory  muscles.  HEART:  Heart has a regular rate and rhythm with an impulse in the left  chest.  ABDOMEN:  Abdomen is soft with some mild right upper quadrant and epigastric  pain but no guarding or peritoneal signs and normal bowel sounds.  I cannot  palpate any masses or hepatosplenomegaly.  EXTREMITIES:  No cyanosis, clubbing or edema.  PSYCHOLOGICAL:  She was alert and oriented x3 with no evidence today of  anxiety or depression.   LABORATORY WORK:  On review of her lab work, she has a normal white count  around 10,000.  Her total bilirubin is normal but SGOT and SGPT are slightly  elevated.   Ultrasound and CT scan were as described above.   ASSESSMENT AND PLAN:  This is a 70 year old white female with possible  cholecystitis, who has also ruled out for a myocardial infarction.  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  I  suspect that she would probably benefit from cholecystectomy, but given this  history of a duodenal tumor, it certainly could account for her bile duct  dilatation and the treatment of this would be very different.  We will ask  the gastroenterologist to help [place] evaluate her for this possibility prior to  planning an operation.   Again, thank you  for allowing [place] to help with the care of this patient.   Sincerely,                                                Ollen Gross. Vernell Morgans, M.D.    PST/MEDQ  D:  01/26/2003  T:  01/27/2003  Job:  [id number]

## 2010-11-07 NOTE — Assessment & Plan Note (Signed)
[facility]                            [facility] OFFICE NOTE   YEILY, LINK                      MRN:          [id number]  DATE:08/27/2006                            DOB:          1940/07/02    This is a 70 year old woman accompanied by her husband, here for a non-  gynecological physical examination.  When I asked her today how she was  doing, she immediately broke down in tears and most of our interview  today consisted of her telling me about her recent struggles with  depression and anxiety.  As noted in the chart, she has struggled with  depression and anxiety for many years and has seen several  psychiatrists.  She has been on medications a couple of times in the  past, but had been doing quite well, for at least the past several  years.  When I saw her this past November, with some blood pressure  problems, we added Norvasc to her regimen of medications.  She says  about a month after that she began having her current problems, and she  is convinced that everything she has been struggling with recently are  side effects from the Norvasc.  She said she once had similar reactions  to another medication in the past and when it was stopped she recovered  quickly.  Today she described feeling anxious, shaky, worrying about  things, feeling sad, being tearful frequently, she has trouble sleeping.  She has decreased energy and she has decreased libido.  Fortunately it  has not affected her appetite too much.  Other than that she has been  doing well.  She continues to see Dr. Primitivo Gauze, her gynecologist in [facility] on a regular basis for exams.  She continues to followup with Dr.  Gypsy Lore, who is an oncologist at [facility] with her history of  treatment for a lone carcinoid tumor.  Further details of her past  medical history, family history, social history, habits, etc. refer to  our last physical note dated March 20, 2005.   ALLERGIES:  NONSTEROIDAL ANTI-INFLAMMATORY MEDICATIONS, PENICILLIN,  ANCEF, TAPE and CONTRAST DYES.   CURRENT MEDICATIONS:  1. Estradiol 0.1 mg/progesterone 100 mg once a day.  2. Multivitamins daily.  3. Maxzide 37.5/25 once daily.  4. Skelaxin 800 mg q.i.d.  5. Potassium chloride 20 mEq daily.  6. Mobic 15 mg daily.  7. Norvasc 5 mg daily.  8. Fish oil daily.  9. Magnesium with Zinc daily.  10.Red Yeast Rice daily.   OBJECTIVE:  Height 5 foot 4 inches.  Weight 167, blood pressure 128/84,  pulse is 84 and regular.  GENERAL:  She appears to be doing well.  SKIN:  Is clear.  Affect again is tearful.  She is wearing glasses.  EYES:  Are clear.  Ears are clear.  Pharynx is clear.  NECK:  Supple without lymphadenopathy, masses.  LUNGS:  Clear.  CARDIAC:  Rate and rhythm regular without gallops, murmurs or rubs.  Pulses full.  EKG is within normal limits.  ABDOMEN:  Soft, normal bowel sounds, nontender, no  masses.  No clubbing,  cyanosis or edema.  NEUROLOGIC:  Grossly intact.   She was here for fasting labs on February 29 and these were all within  normal limits.  Her lipid panel showed an excellent HDL at 46 with an  LDL of 284.   ASSESSMENT/PLAN:  1. Complete physical.  We talked about getting more regular exercise.  2. Hypertension, well controlled. However for the reasons noted below      we will stop Norvasc and begin Lisinopril 20 mg once daily.  I      asked to see her back in 1 month for a recheck.  3. Recent struggles with anxiety and depression.  I told the patient      that I highly doubt it that these stem from her use of Norvasc,      although I will work with her on this and stop it as noted above.      We will revisit this in 1 month when she comes back.  If she is      still having problems at that point, I will more vigorously push to      treating it as such.  4. Hyperlipidemia.  Currently well controlled on diet alone.  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  She will follow up with      the oncologist.  6. Chronic neck and back pain - stable.     Tera Mater. Clent Ridges, MD  Electronically Signed    SAF/MedQ  DD: 08/27/2006  DT: 08/27/2006  Job #: [id number]

## 2011-03-02 ENCOUNTER — Ambulatory Visit (INDEPENDENT_AMBULATORY_CARE_PROVIDER_SITE_OTHER): Payer: Medicare Other | Admitting: Family Medicine

## 2011-03-02 ENCOUNTER — Encounter: Payer: Self-pay | Admitting: Family Medicine

## 2011-03-02 VITALS — BP 124/72 | HR 84 | Temp 98.2°F | Wt 164.0 lb

## 2011-03-02 DIAGNOSIS — J329 Chronic sinusitis, unspecified: Secondary | ICD-10-CM

## 2011-03-02 MED ORDER — AZITHROMYCIN 250 MG PO TABS: ORAL_TABLET | ORAL | Status: AC

## 2011-03-02 NOTE — Progress Notes (Signed)
  Subjective:    Patient ID: Karen Watson, female    DOB: 1941-01-12, 70 y.o.   MRN: [id number]  HPI Here for 4 days of sinus pressure, PND, ST, and a dry cough. No fever.    Review of Systems  Constitutional: Negative.   HENT: Positive for congestion, postnasal drip and sinus pressure.   Eyes: Negative.   Respiratory: Positive for cough.        Objective:   Physical Exam  Constitutional: She appears well-developed and well-nourished.  HENT:  Right Ear: External ear normal.  Left Ear: External ear normal.  Nose: Nose normal.  Mouth/Throat: Oropharynx is clear and moist. No oropharyngeal exudate.  Eyes: Conjunctivae are normal. Pupils are equal, round, and reactive to light.  Neck: Neck supple. No thyromegaly present.  Pulmonary/Chest: Effort normal and breath sounds normal.          Assessment & Plan:  Rest, fluids

## 2011-03-03 ENCOUNTER — Ambulatory Visit: Payer: Medicare Other | Admitting: Family Medicine

## 2011-03-23 ENCOUNTER — Ambulatory Visit (INDEPENDENT_AMBULATORY_CARE_PROVIDER_SITE_OTHER): Payer: Medicare Other

## 2011-03-23 DIAGNOSIS — Z23 Encounter for immunization: Secondary | ICD-10-CM

## 2011-05-04 ENCOUNTER — Ambulatory Visit (INDEPENDENT_AMBULATORY_CARE_PROVIDER_SITE_OTHER): Payer: Medicare Other | Admitting: Family Medicine

## 2011-05-04 ENCOUNTER — Encounter: Payer: Self-pay | Admitting: Family Medicine

## 2011-05-04 VITALS — BP 136/74 | HR 74 | Temp 98.4°F | Ht 65.25 in | Wt 165.0 lb

## 2011-05-04 DIAGNOSIS — Z136 Encounter for screening for cardiovascular disorders: Secondary | ICD-10-CM

## 2011-05-04 DIAGNOSIS — Z0000 Encounter for general adult medical examination without abnormal findings: Secondary | ICD-10-CM

## 2011-05-04 DIAGNOSIS — Z79899 Other long term (current) drug therapy: Secondary | ICD-10-CM

## 2011-05-04 LAB — POCT URINALYSIS DIPSTICK
Blood, UA: NEGATIVE
Leukocytes, UA: NEGATIVE
Nitrite, UA: NEGATIVE
Protein, UA: NEGATIVE
Urobilinogen, UA: 0.2
pH, UA: 7

## 2011-05-04 MED ORDER — CYCLOBENZAPRINE HCL 10 MG PO TABS: 10.0000 mg | ORAL_TABLET | ORAL | Status: DC | PRN

## 2011-05-04 MED ORDER — GABAPENTIN 300 MG PO CAPS: 300.0000 mg | ORAL_CAPSULE | Freq: Three times a day (TID) | ORAL | Status: DC

## 2011-05-04 MED ORDER — METAXALONE 800 MG PO TABS: 800.0000 mg | ORAL_TABLET | Freq: Four times a day (QID) | ORAL | Status: DC

## 2011-05-04 MED ORDER — MELOXICAM 15 MG PO TABS: 15.0000 mg | ORAL_TABLET | Freq: Every day | ORAL | Status: DC

## 2011-05-04 MED ORDER — LISINOPRIL 20 MG PO TABS: 20.0000 mg | ORAL_TABLET | Freq: Every day | ORAL | Status: DC

## 2011-05-04 MED ORDER — TRIAMTERENE-HCTZ 37.5-25 MG PO TABS: 1.0000 | ORAL_TABLET | Freq: Every day | ORAL | Status: DC

## 2011-05-04 MED ORDER — POTASSIUM CHLORIDE CRYS ER 20 MEQ PO TBCR: 20.0000 meq | EXTENDED_RELEASE_TABLET | Freq: Every day | ORAL | Status: DC

## 2011-05-04 NOTE — Progress Notes (Signed)
  Subjective:    Patient ID: Karen Watson, female    DOB: 21-Dec-1940, 70 y.o.   MRN: [id number]  HPI 70 yr old female for a cpx. She is well in general except for her aches in the arms and legs. She is still on Mobic daily. Tries to get some exercise.    Review of Systems  Constitutional: Negative.   HENT: Negative.   Eyes: Negative.   Respiratory: Negative.   Cardiovascular: Negative.   Gastrointestinal: Negative.   Genitourinary: Negative for dysuria, urgency, frequency, hematuria, flank pain, decreased urine volume, enuresis, difficulty urinating, pelvic pain and dyspareunia.  Musculoskeletal: Positive for myalgias, back pain and arthralgias. Negative for joint swelling and gait problem.  Skin: Negative.   Neurological: Negative.   Hematological: Negative.   Psychiatric/Behavioral: Negative.        Objective:   Physical Exam  Constitutional: She is oriented to person, place, and time. She appears well-developed and well-nourished. No distress.  HENT:  Head: Normocephalic and atraumatic.  Right Ear: External ear normal.  Left Ear: External ear normal.  Nose: Nose normal.  Mouth/Throat: Oropharynx is clear and moist. No oropharyngeal exudate.  Eyes: Conjunctivae and EOM are normal. Pupils are equal, round, and reactive to light. No scleral icterus.  Neck: Normal range of motion. Neck supple. No JVD present. No thyromegaly present.  Cardiovascular: Normal rate, regular rhythm, normal heart sounds and intact distal pulses.  Exam reveals no gallop and no friction rub.   No murmur heard.      EKG normal   Pulmonary/Chest: Effort normal and breath sounds normal. No respiratory distress. She has no wheezes. She has no rales. She exhibits no tenderness.  Abdominal: Soft. Bowel sounds are normal. She exhibits no distension and no mass. There is no tenderness. There is no rebound and no guarding.  Musculoskeletal: Normal range of motion. She exhibits no edema and no tenderness.    Lymphadenopathy:    She has no cervical adenopathy.  Neurological: She is alert and oriented to person, place, and time. She has normal reflexes. No cranial nerve deficit. She exhibits normal muscle tone. Coordination normal.  Skin: Skin is warm and dry. No rash noted. No erythema.  Psychiatric: She has a normal mood and affect. Her behavior is normal. Judgment and thought content normal.          Assessment & Plan:  Well exam. Get labs today. Try Neurontin for the myalgias.

## 2011-05-05 LAB — CBC WITH DIFFERENTIAL/PLATELET
Basophils Absolute: 0 10*3/uL (ref 0.0–0.1)
Eosinophils Absolute: 0.1 10*3/uL (ref 0.0–0.7)
Lymphocytes Relative: 25 % (ref 12.0–46.0)
MCHC: 33.5 g/dL (ref 30.0–36.0)
MCV: 95.4 fl (ref 78.0–100.0)
Monocytes Absolute: 0.4 10*3/uL (ref 0.1–1.0)
Neutrophils Relative %: 66.6 % (ref 43.0–77.0)
RDW: 14.8 % — ABNORMAL HIGH (ref 11.5–14.6)

## 2011-05-05 LAB — BASIC METABOLIC PANEL
BUN: 29 mg/dL — ABNORMAL HIGH (ref 6–23)
CO2: 26 mEq/L (ref 19–32)
Chloride: 103 mEq/L (ref 96–112)
GFR: 50.19 mL/min — ABNORMAL LOW (ref >60.00)
Glucose, Bld: 84 mg/dL (ref 70–99)
Potassium: 4.9 mEq/L (ref 3.5–5.1)

## 2011-05-05 LAB — LIPID PANEL
Cholesterol: 182 mg/dL (ref 0–200)
HDL: 43.2 mg/dL (ref >39.00)
Triglycerides: 104 mg/dL (ref 0.0–149.0)
VLDL: 20.8 mg/dL (ref 0.0–40.0)

## 2011-05-05 LAB — HEPATIC FUNCTION PANEL
ALT: 18 U/L (ref 0–35)
AST: 22 U/L (ref 0–37)
Albumin: 4.4 g/dL (ref 3.5–5.2)
Total Protein: 7.7 g/dL (ref 6.0–8.3)

## 2011-05-06 ENCOUNTER — Encounter: Payer: Self-pay | Admitting: Family Medicine

## 2011-05-06 NOTE — Progress Notes (Signed)
Quick Note:  Pt aware and also put a copy in mail, per pt request. ______

## 2011-05-26 ENCOUNTER — Telehealth: Payer: Self-pay | Admitting: Family Medicine

## 2011-05-26 NOTE — Telephone Encounter (Signed)
Flexeril 10 mg take 1 po tid prn and was faxed to [facility].

## 2011-06-05 ENCOUNTER — Other Ambulatory Visit: Payer: Self-pay | Admitting: Family Medicine

## 2011-09-16 ENCOUNTER — Telehealth: Payer: Self-pay | Admitting: *Deleted

## 2011-09-16 MED ORDER — PREDNISONE (PAK) 10 MG PO TABS: ORAL_TABLET | ORAL | Status: DC

## 2011-09-16 NOTE — Telephone Encounter (Signed)
Pt is asking if Dr. Clent Ridges would prescribe a dose pack of prednisone for a case of poison ivy.  Worst places are on the inside of her wrists, shoulder, and scalp.

## 2011-09-16 NOTE — Telephone Encounter (Signed)
Call in a 12 day taper pack of 10 mg Prednisone, #48

## 2011-09-16 NOTE — Telephone Encounter (Signed)
Done

## 2011-09-29 ENCOUNTER — Encounter: Payer: Self-pay | Admitting: Family Medicine

## 2011-09-29 ENCOUNTER — Ambulatory Visit (INDEPENDENT_AMBULATORY_CARE_PROVIDER_SITE_OTHER): Payer: Medicare Other | Admitting: Family Medicine

## 2011-09-29 VITALS — BP 128/78 | HR 67 | Temp 98.4°F | Wt 174.0 lb

## 2011-09-29 DIAGNOSIS — J40 Bronchitis, not specified as acute or chronic: Secondary | ICD-10-CM

## 2011-09-29 MED ORDER — AZITHROMYCIN 250 MG PO TABS: ORAL_TABLET | ORAL | Status: AC

## 2011-09-29 NOTE — Progress Notes (Signed)
  Subjective:    Patient ID: Karen Watson, female    DOB: 04-23-1941, 71 y.o.   MRN: [id number]  HPI Here for 10 days of fevers, PND, chest tightness and coughing up green sputum.    Review of Systems  Constitutional: Positive for fever.  HENT: Positive for congestion and postnasal drip.   Eyes: Negative.   Respiratory: Positive for cough and chest tightness.        Objective:   Physical Exam  Constitutional: She appears well-developed and well-nourished.  HENT:  Right Ear: External ear normal.  Left Ear: External ear normal.  Nose: Nose normal.  Mouth/Throat: Oropharynx is clear and moist. No oropharyngeal exudate.  Eyes: Conjunctivae are normal.  Pulmonary/Chest: Effort normal and breath sounds normal. No respiratory distress. She has no wheezes. She has no rales.  Lymphadenopathy:    She has no cervical adenopathy.          Assessment & Plan:  Add Robitussin DM.

## 2011-10-28 ENCOUNTER — Ambulatory Visit (INDEPENDENT_AMBULATORY_CARE_PROVIDER_SITE_OTHER): Payer: Medicare Other | Admitting: Family Medicine

## 2011-10-28 ENCOUNTER — Encounter: Payer: Self-pay | Admitting: Family Medicine

## 2011-10-28 VITALS — BP 124/66 | HR 84 | Temp 98.3°F | Wt 178.0 lb

## 2011-10-28 DIAGNOSIS — H6120 Impacted cerumen, unspecified ear: Secondary | ICD-10-CM

## 2011-10-28 NOTE — Progress Notes (Signed)
  Subjective:    Patient ID: Karen Watson, female    DOB: 01-Aug-1940, 71 y.o.   MRN: [id number]  HPI Here for several weeks of loss of hearing in the left ear. She has some mild fullness in both ears as well, but no pain.    Review of Systems  Constitutional: Negative.   HENT: Positive for hearing loss. Negative for ear pain, congestion, tinnitus and ear discharge.   Eyes: Negative.   Respiratory: Negative.        Objective:   Physical Exam  Constitutional: She appears well-developed and well-nourished.  HENT:  Head: Normocephalic and atraumatic.  Nose: Nose normal.  Mouth/Throat: Oropharynx is clear and moist.       Both ear canals are full of cerumen  Eyes: Conjunctivae are normal.  Neck: Neck supple.  Pulmonary/Chest: Effort normal and breath sounds normal.  Lymphadenopathy:    She has no cervical adenopathy.          Assessment & Plan:  Both ears were irrigated clear with water. Recheck prn

## 2012-02-11 ENCOUNTER — Other Ambulatory Visit: Payer: Self-pay | Admitting: Rheumatology

## 2012-02-11 DIAGNOSIS — M545 Low back pain: Secondary | ICD-10-CM

## 2012-02-13 ENCOUNTER — Ambulatory Visit
Admission: RE | Admit: 2012-02-13 | Discharge: 2012-02-13 | Disposition: A | Discharge status: Home / Self Care | Payer: Medicare Other | Source: Ambulatory Visit | Attending: Rheumatology | Admitting: Rheumatology

## 2012-02-13 DIAGNOSIS — M545 Low back pain: Secondary | ICD-10-CM

## 2012-02-14 ENCOUNTER — Other Ambulatory Visit: Payer: Medicare Other

## 2012-02-17 ENCOUNTER — Other Ambulatory Visit: Payer: Self-pay | Admitting: Rheumatology

## 2012-02-17 DIAGNOSIS — M5126 Other intervertebral disc displacement, lumbar region: Secondary | ICD-10-CM

## 2012-02-18 ENCOUNTER — Ambulatory Visit
Admission: RE | Admit: 2012-02-18 | Discharge: 2012-02-18 | Disposition: A | Discharge status: Home / Self Care | Payer: Medicare Other | Source: Ambulatory Visit | Attending: Rheumatology | Admitting: Rheumatology

## 2012-02-18 VITALS — BP 121/70 | HR 69

## 2012-02-18 DIAGNOSIS — M5126 Other intervertebral disc displacement, lumbar region: Secondary | ICD-10-CM

## 2012-02-18 MED ORDER — IOHEXOL 180 MG/ML  SOLN
1.0000 mL | Freq: Once | INTRAMUSCULAR | Status: AC | PRN
  Administered 2012-02-18: 1 mL via EPIDURAL

## 2012-02-18 MED ORDER — METHYLPREDNISOLONE ACETATE 40 MG/ML INJ SUSP (RADIOLOG
120.0000 mg | Freq: Once | INTRAMUSCULAR | Status: AC
  Administered 2012-02-18: 120 mg via EPIDURAL

## 2012-02-24 ENCOUNTER — Other Ambulatory Visit: Payer: Self-pay | Admitting: Rheumatology

## 2012-02-24 DIAGNOSIS — M545 Low back pain: Secondary | ICD-10-CM

## 2012-03-03 ENCOUNTER — Ambulatory Visit
Admission: RE | Admit: 2012-03-03 | Discharge: 2012-03-03 | Disposition: A | Discharge status: Home / Self Care | Payer: Medicare Other | Source: Ambulatory Visit | Attending: Rheumatology | Admitting: Rheumatology

## 2012-03-03 DIAGNOSIS — M545 Low back pain: Secondary | ICD-10-CM

## 2012-03-03 MED ORDER — IOHEXOL 180 MG/ML  SOLN
1.0000 mL | Freq: Once | INTRAMUSCULAR | Status: AC | PRN
  Administered 2012-03-03: 1 mL via EPIDURAL

## 2012-03-03 MED ORDER — METHYLPREDNISOLONE ACETATE 40 MG/ML INJ SUSP (RADIOLOG
120.0000 mg | Freq: Once | INTRAMUSCULAR | Status: AC
  Administered 2012-03-03: 120 mg via EPIDURAL

## 2012-03-09 ENCOUNTER — Other Ambulatory Visit: Payer: Self-pay | Admitting: Dermatology

## 2012-03-23 ENCOUNTER — Ambulatory Visit
Admission: RE | Admit: 2012-03-23 | Discharge: 2012-03-23 | Disposition: A | Discharge status: Home / Self Care | Payer: Medicare Other | Source: Ambulatory Visit | Attending: Rheumatology | Admitting: Rheumatology

## 2012-03-23 ENCOUNTER — Other Ambulatory Visit: Payer: Self-pay | Admitting: Rheumatology

## 2012-03-23 DIAGNOSIS — M542 Cervicalgia: Secondary | ICD-10-CM

## 2012-04-04 ENCOUNTER — Encounter: Payer: Self-pay | Admitting: Family Medicine

## 2012-04-04 ENCOUNTER — Ambulatory Visit (INDEPENDENT_AMBULATORY_CARE_PROVIDER_SITE_OTHER): Payer: Medicare Other | Admitting: Family Medicine

## 2012-04-04 VITALS — BP 130/80 | HR 89 | Temp 98.5°F | Wt 180.0 lb

## 2012-04-04 DIAGNOSIS — J40 Bronchitis, not specified as acute or chronic: Secondary | ICD-10-CM

## 2012-04-04 MED ORDER — BENZONATATE 100 MG PO CAPS: 100.0000 mg | ORAL_CAPSULE | Freq: Four times a day (QID) | ORAL | Status: DC | PRN

## 2012-04-04 MED ORDER — AZITHROMYCIN 250 MG PO TABS: ORAL_TABLET | ORAL | Status: DC

## 2012-04-04 NOTE — Progress Notes (Signed)
  Subjective:    Patient ID: Karen Watson, female    DOB: 1940-11-10, 71 y.o.   MRN: [id number]  HPI Here for 3 days of ST, chest tightness, and coughing up yellow sputum. No fever   Review of Systems  Constitutional: Negative.   HENT: Positive for sore throat. Negative for congestion, postnasal drip and sinus pressure.   Eyes: Negative.   Respiratory: Positive for cough and chest tightness.        Objective:   Physical Exam  Constitutional: She appears well-developed and well-nourished. No distress.  HENT:  Right Ear: External ear normal.  Left Ear: External ear normal.  Nose: Nose normal.  Mouth/Throat: Oropharynx is clear and moist.  Eyes: Conjunctivae normal are normal.  Neck: No thyromegaly present.  Pulmonary/Chest: Effort normal and breath sounds normal.  Lymphadenopathy:    She has no cervical adenopathy.          Assessment & Plan:  Because every kind of cough syrup causes her nausea, we will try Benzonatate pills

## 2012-04-06 ENCOUNTER — Ambulatory Visit: Payer: Medicare Other

## 2012-04-18 ENCOUNTER — Ambulatory Visit (INDEPENDENT_AMBULATORY_CARE_PROVIDER_SITE_OTHER): Payer: Medicare Other | Admitting: Family Medicine

## 2012-04-18 ENCOUNTER — Telehealth (INDEPENDENT_AMBULATORY_CARE_PROVIDER_SITE_OTHER): Payer: Medicare Other | Admitting: Family Medicine

## 2012-04-18 DIAGNOSIS — Z23 Encounter for immunization: Secondary | ICD-10-CM

## 2012-04-18 DIAGNOSIS — Z0000 Encounter for general adult medical examination without abnormal findings: Secondary | ICD-10-CM

## 2012-04-18 NOTE — Telephone Encounter (Signed)
Pt was on schedule for a pnuemovax, which I did give.

## 2012-05-05 ENCOUNTER — Ambulatory Visit (INDEPENDENT_AMBULATORY_CARE_PROVIDER_SITE_OTHER): Payer: Medicare Other | Admitting: Family Medicine

## 2012-05-05 ENCOUNTER — Encounter: Payer: Self-pay | Admitting: Family Medicine

## 2012-05-05 VITALS — BP 126/80 | HR 85 | Temp 98.3°F | Ht 65.0 in | Wt 180.0 lb

## 2012-05-05 DIAGNOSIS — Z0000 Encounter for general adult medical examination without abnormal findings: Secondary | ICD-10-CM

## 2012-05-05 DIAGNOSIS — Z136 Encounter for screening for cardiovascular disorders: Secondary | ICD-10-CM

## 2012-05-05 DIAGNOSIS — Z79899 Other long term (current) drug therapy: Secondary | ICD-10-CM

## 2012-05-05 DIAGNOSIS — M545 Low back pain: Secondary | ICD-10-CM

## 2012-05-05 LAB — CBC WITH DIFFERENTIAL/PLATELET
Basophils Absolute: 0 10*3/uL (ref 0.0–0.1)
Basophils Relative: 0.6 % (ref 0.0–3.0)
Eosinophils Absolute: 0.2 10*3/uL (ref 0.0–0.7)
MCHC: 32.7 g/dL (ref 30.0–36.0)
MCV: 98.3 fl (ref 78.0–100.0)
Monocytes Absolute: 0.5 10*3/uL (ref 0.1–1.0)
Neutro Abs: 4.6 10*3/uL (ref 1.4–7.7)
Neutrophils Relative %: 65.6 % (ref 43.0–77.0)
RBC: 3.77 Mil/uL — ABNORMAL LOW (ref 3.87–5.11)
RDW: 15.2 % — ABNORMAL HIGH (ref 11.5–14.6)

## 2012-05-05 LAB — HEPATIC FUNCTION PANEL
ALT: 28 U/L (ref 0–35)
AST: 28 U/L (ref 0–37)
Albumin: 4.4 g/dL (ref 3.5–5.2)
Alkaline Phosphatase: 55 U/L (ref 39–117)
Total Protein: 7.4 g/dL (ref 6.0–8.3)

## 2012-05-05 LAB — BASIC METABOLIC PANEL
BUN: 22 mg/dL (ref 6–23)
CO2: 25 mEq/L (ref 19–32)
Chloride: 104 mEq/L (ref 96–112)
Glucose, Bld: 101 mg/dL — ABNORMAL HIGH (ref 70–99)
Potassium: 4.5 mEq/L (ref 3.5–5.1)
Sodium: 139 mEq/L (ref 135–145)

## 2012-05-05 LAB — POCT URINALYSIS DIPSTICK
Bilirubin, UA: NEGATIVE
Blood, UA: NEGATIVE
Nitrite, UA: NEGATIVE
Protein, UA: NEGATIVE
pH, UA: 5.5

## 2012-05-05 MED ORDER — LISINOPRIL 20 MG PO TABS: 20.0000 mg | ORAL_TABLET | Freq: Every day | ORAL | Status: DC

## 2012-05-05 MED ORDER — POTASSIUM CHLORIDE CRYS ER 20 MEQ PO TBCR: 20.0000 meq | EXTENDED_RELEASE_TABLET | Freq: Every day | ORAL | Status: DC

## 2012-05-05 MED ORDER — TRIAMTERENE-HCTZ 37.5-25 MG PO TABS: 1.0000 | ORAL_TABLET | Freq: Every day | ORAL | Status: DC

## 2012-05-05 MED ORDER — GABAPENTIN 300 MG PO CAPS: 300.0000 mg | ORAL_CAPSULE | Freq: Three times a day (TID) | ORAL | Status: DC

## 2012-05-05 MED ORDER — MELOXICAM 15 MG PO TABS: 15.0000 mg | ORAL_TABLET | Freq: Every day | ORAL | Status: DC

## 2012-05-05 MED ORDER — METAXALONE 800 MG PO TABS: 800.0000 mg | ORAL_TABLET | Freq: Four times a day (QID) | ORAL | Status: DC

## 2012-05-05 NOTE — Progress Notes (Signed)
  Subjective:    Patient ID: Karen Watson, female    DOB: 06/10/1941, 71 y.o.   MRN: [id number]  HPI 71 yr old female for a cpx. She feels well except for her low back pain. She is seeing Dr. Marikay Alar for this, and they are considering a lumbar fusion surgery. She is past due for a colonoscopy.    Review of Systems  Constitutional: Negative.   HENT: Negative.   Eyes: Negative.   Respiratory: Negative.   Cardiovascular: Negative.   Gastrointestinal: Negative.   Genitourinary: Negative for dysuria, urgency, frequency, hematuria, flank pain, decreased urine volume, enuresis, difficulty urinating, pelvic pain and dyspareunia.  Musculoskeletal: Negative.   Skin: Negative.   Neurological: Negative.   Hematological: Negative.   Psychiatric/Behavioral: Negative.        Objective:   Physical Exam  Constitutional: She is oriented to person, place, and time. She appears well-developed and well-nourished. No distress.  HENT:  Head: Normocephalic and atraumatic.  Right Ear: External ear normal.  Left Ear: External ear normal.  Nose: Nose normal.  Mouth/Throat: Oropharynx is clear and moist. No oropharyngeal exudate.  Eyes: Conjunctivae normal and EOM are normal. Pupils are equal, round, and reactive to light. No scleral icterus.  Neck: Normal range of motion. Neck supple. No JVD present. No thyromegaly present.  Cardiovascular: Normal rate, regular rhythm, normal heart sounds and intact distal pulses.  Exam reveals no gallop and no friction rub.   No murmur heard.      EKG normal   Pulmonary/Chest: Effort normal and breath sounds normal. No respiratory distress. She has no wheezes. She has no rales. She exhibits no tenderness.  Abdominal: Soft. Bowel sounds are normal. She exhibits no distension and no mass. There is no tenderness. There is no rebound and no guarding.  Musculoskeletal: Normal range of motion. She exhibits no edema and no tenderness.  Lymphadenopathy:    She has no  cervical adenopathy.  Neurological: She is alert and oriented to person, place, and time. She has normal reflexes. No cranial nerve deficit. She exhibits normal muscle tone. Coordination normal.  Skin: Skin is warm and dry. No rash noted. No erythema.  Psychiatric: She has a normal mood and affect. Her behavior is normal. Judgment and thought content normal.          Assessment & Plan:  Well exam. Set up a colonoscopy. Get fasting labs.

## 2012-05-06 ENCOUNTER — Encounter: Payer: Self-pay | Admitting: Internal Medicine

## 2012-05-09 ENCOUNTER — Encounter: Payer: Self-pay | Admitting: Family Medicine

## 2012-05-09 NOTE — Progress Notes (Signed)
Quick Note:  I spoke with pt and put a copy of results in mail. ______ 

## 2012-05-17 ENCOUNTER — Other Ambulatory Visit: Payer: Self-pay | Admitting: Neurological Surgery

## 2012-05-26 MED ORDER — LIDOCAINE HCL 2 % EX GEL
CUTANEOUS | Status: AC
  Filled 2012-05-26: qty 40

## 2012-05-30 ENCOUNTER — Encounter (HOSPITAL_COMMUNITY): Payer: Self-pay | Admitting: Pharmacy Technician

## 2012-06-01 MED ORDER — LIDOCAINE HCL 4 % IJ SOLN
INTRAMUSCULAR | Status: AC
  Filled 2012-06-01: qty 5

## 2012-06-02 ENCOUNTER — Encounter (HOSPITAL_COMMUNITY)
Admission: RE | Admit: 2012-06-02 | Discharge: 2012-06-02 | Disposition: A | Discharge status: Home / Self Care | Payer: Medicare Other | Source: Ambulatory Visit | Attending: Neurological Surgery | Admitting: Neurological Surgery

## 2012-06-02 ENCOUNTER — Encounter (HOSPITAL_COMMUNITY): Payer: Self-pay | Admitting: *Deleted

## 2012-06-02 LAB — CBC WITH DIFFERENTIAL/PLATELET
Basophils Absolute: 0 10*3/uL (ref 0.0–0.1)
Basophils Relative: 1 % (ref 0–1)
Eosinophils Absolute: 0.1 10*3/uL (ref 0.0–0.7)
HCT: 37.6 % (ref 36.0–46.0)
Hemoglobin: 11.7 g/dL — ABNORMAL LOW (ref 12.0–15.0)
MCH: 30.7 pg (ref 26.0–34.0)
MCHC: 31.1 g/dL (ref 30.0–36.0)
Monocytes Absolute: 0.5 10*3/uL (ref 0.1–1.0)
Monocytes Relative: 8 % (ref 3–12)
Neutrophils Relative %: 56 % (ref 43–77)
RDW: 14 % (ref 11.5–15.5)

## 2012-06-02 LAB — BASIC METABOLIC PANEL
BUN: 30 mg/dL — ABNORMAL HIGH (ref 6–23)
Creatinine, Ser: 1.2 mg/dL — ABNORMAL HIGH (ref 0.50–1.10)
GFR calc Af Amer: 52 mL/min — ABNORMAL LOW (ref >90)
GFR calc non Af Amer: 45 mL/min — ABNORMAL LOW (ref >90)

## 2012-06-02 LAB — SURGICAL PCR SCREEN
MRSA, PCR: NEGATIVE
Staphylococcus aureus: NEGATIVE

## 2012-06-02 LAB — PROTIME-INR: Prothrombin Time: 12.2 seconds (ref 11.6–15.2)

## 2012-06-02 LAB — TYPE AND SCREEN: Antibody Screen: NEGATIVE

## 2012-06-02 NOTE — Progress Notes (Signed)
Called patient states that she has know nodule on lung at that 11 mm seems to be the right size. Will request records from Dr. Darlis Loan at [facility] who is following her.

## 2012-06-02 NOTE — Progress Notes (Signed)
Notified Erie Noe at Dr. Yetta Barre office regarding CXR

## 2012-06-02 NOTE — Pre-Procedure Instructions (Signed)
20 Karen Watson  06/02/2012   Your procedure is scheduled on:  December 18  Report to [facility] at 06:30 AM.  Call this number if you have problems the morning of surgery: [phone]   Remember:   Do not eat or drink:After Midnight.  Take these medicines the morning of surgery with A SIP OF WATER: Tylenol (if needed), Gabapentin, Skelaxin    STOP Vitamin B, Oscal, Magnesium with Zinc, Multiple Vitamins, Vitamin C, Vitamin E after today  Do not wear jewelry, make-up or nail polish.  Do not wear lotions, powders, or perfumes. You may wear deodorant.  Do not shave 48 hours prior to surgery. Men may shave face and neck.  Do not bring valuables to the hospital.  Contacts, dentures or bridgework may not be worn into surgery.  Leave suitcase in the car. After surgery it may be brought to your room.  For patients admitted to the hospital, checkout time is 11:00 AM the day of discharge.   Special Instructions: Shower using CHG 2 nights before surgery and the night before surgery.  If you shower the day of surgery use CHG.  Use special wash - you have one bottle of CHG for all showers.  You should use approximately 1/3 of the bottle for each shower.   Please read over the following fact sheets that you were given: Pain Booklet, Coughing and Deep Breathing, Blood Transfusion Information and Surgical Site Infection Prevention

## 2012-06-03 NOTE — Consult Note (Addendum)
Anesthesia Chart Review:  Patient is a 71 year old female posted for a two level ALIF by Dr. Yetta Barre on 06/08/12.  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 Dola Factor in [facility]).  PCP is Dr. Gershon Crane.  For anesthesia history, she reports hives and vomiting after her cholecystectomy on 02/11/03 felt likely due to "Ancef or Carbocaine" (anesthesia record requested).  She reports her daughter has problems with anesthesia, but could not elaborate. She denied known history of malignant hyperthermia.  She was encouraged to talk with her daughter about her specific anesthesia issues so she can discuss them with her anesthesiologist on the day of surgery.   EKG on 05/05/12 showed NSR.  CXR on 06/02/12 showed: 1. No active lung disease.  2. Question of 11 mm noncalcified nodule at the right lung base. Consider CT of the chest to evaluate further. Her PAT RN already called result to [place] at [facility] and writes that patient reported a known right lung nodule.  Records from [facility] requested, but are still pending.  Pre-operative labs noted.  I'll follow-up once additional records are available.  Shonna Chock, PA-C 06/03/12 1351  Addendum: 06/06/12 1330 I reviewed Pulmonology records on 03/15/12 from Dr. Silvestre Moment.  She was felt to clinically be doing well without carcinoid associated symptoms at that time.  CT scan from 09/12/04 showed a stable 7 mm RLL lund nodule, stable mildly prominent, no pathologically enlarged, lymph nodes along the small bowel mesentery.  This may correlate with her recent CXR findings of right lung base nodule.  Dr. Yetta Barre has ordered a chest CT to be done on 06/07/12 @ 1550.  I'll review CT scan results if they are available prior to the end of the business day on 06/07/12; otherwise results will be viewed by Dr. Yetta Barre and by her assigned anesthesiologist on the day of  surgery.  Of note, her anesthesia records from her cholecystectomy on on her chart for review by the anesthesiologist if needed.

## 2012-06-06 ENCOUNTER — Other Ambulatory Visit: Payer: Self-pay | Admitting: Neurological Surgery

## 2012-06-06 DIAGNOSIS — R222 Localized swelling, mass and lump, trunk: Secondary | ICD-10-CM

## 2012-06-07 ENCOUNTER — Ambulatory Visit
Admission: RE | Admit: 2012-06-07 | Discharge: 2012-06-07 | Disposition: A | Discharge status: Home / Self Care | Payer: Medicare Other | Source: Ambulatory Visit | Attending: Neurological Surgery | Admitting: Neurological Surgery

## 2012-06-07 DIAGNOSIS — R222 Localized swelling, mass and lump, trunk: Secondary | ICD-10-CM

## 2012-06-07 MED ORDER — VANCOMYCIN HCL IN DEXTROSE 1-5 GM/200ML-% IV SOLN
1000.0000 mg | INTRAVENOUS | Status: AC
  Administered 2012-06-08: 1000 mg via INTRAVENOUS
  Filled 2012-06-07: qty 200

## 2012-06-07 MED ORDER — DEXAMETHASONE SODIUM PHOSPHATE 10 MG/ML IJ SOLN
10.0000 mg | INTRAMUSCULAR | Status: AC
  Administered 2012-06-08: 10 mg via INTRAVENOUS
  Filled 2012-06-07: qty 1

## 2012-06-08 ENCOUNTER — Encounter (HOSPITAL_COMMUNITY)
Admission: RE | Disposition: A | Discharge status: Home / Self Care | Payer: Self-pay | Source: Ambulatory Visit | Attending: Neurological Surgery

## 2012-06-08 ENCOUNTER — Encounter (HOSPITAL_COMMUNITY): Payer: Self-pay | Admitting: Vascular Surgery

## 2012-06-08 ENCOUNTER — Encounter (HOSPITAL_COMMUNITY): Payer: Self-pay | Admitting: Neurological Surgery

## 2012-06-08 ENCOUNTER — Inpatient Hospital Stay (HOSPITAL_COMMUNITY): Payer: Medicare Other

## 2012-06-08 ENCOUNTER — Inpatient Hospital Stay (HOSPITAL_COMMUNITY)
Admission: RE | Admit: 2012-06-08 | Discharge: 2012-06-10 | DRG: 458 | Disposition: A | Discharge status: Home / Self Care | Payer: Medicare Other | Source: Ambulatory Visit | Attending: Neurological Surgery | Admitting: Neurological Surgery

## 2012-06-08 ENCOUNTER — Inpatient Hospital Stay (HOSPITAL_COMMUNITY): Payer: Medicare Other | Admitting: Vascular Surgery

## 2012-06-08 DIAGNOSIS — Z79899 Other long term (current) drug therapy: Secondary | ICD-10-CM

## 2012-06-08 DIAGNOSIS — M5137 Other intervertebral disc degeneration, lumbosacral region: Principal | ICD-10-CM | POA: Diagnosis present

## 2012-06-08 DIAGNOSIS — Z981 Arthrodesis status: Secondary | ICD-10-CM

## 2012-06-08 DIAGNOSIS — M4150 Other secondary scoliosis, site unspecified: Secondary | ICD-10-CM | POA: Diagnosis present

## 2012-06-08 DIAGNOSIS — M51379 Other intervertebral disc degeneration, lumbosacral region without mention of lumbar back pain or lower extremity pain: Principal | ICD-10-CM | POA: Diagnosis present

## 2012-06-08 HISTORY — DX: Family history of other specified conditions: Z84.89

## 2012-06-08 HISTORY — DX: Adverse effect of unspecified anesthetic, initial encounter: T41.45XA

## 2012-06-08 HISTORY — DX: Essential (primary) hypertension: I10

## 2012-06-08 HISTORY — DX: Malignant neoplasm of middle lobe, bronchus or lung: C34.2

## 2012-06-08 HISTORY — DX: Unspecified osteoarthritis, unspecified site: M19.90

## 2012-06-08 HISTORY — DX: Unspecified chronic bronchitis: J42

## 2012-06-08 HISTORY — DX: Basal cell carcinoma of skin, unspecified: C44.91

## 2012-06-08 HISTORY — DX: Malignant neoplasm of lower lobe, unspecified bronchus or lung: C34.30

## 2012-06-08 HISTORY — PX: LUMBAR PERCUTANEOUS PEDICLE SCREW 1 LEVEL: SHX5560

## 2012-06-08 HISTORY — PX: ANTERIOR LAT LUMBAR FUSION: SHX1168

## 2012-06-08 SURGERY — ANTERIOR LATERAL LUMBAR FUSION 2 LEVELS
Anesthesia: General | Site: Flank | Laterality: Right | Wound class: Clean

## 2012-06-08 MED ORDER — VANCOMYCIN HCL 10 G IV SOLR
1250.0000 mg | Freq: Once | INTRAVENOUS | Status: AC
  Administered 2012-06-09: 1250 mg via INTRAVENOUS
  Filled 2012-06-08: qty 1250

## 2012-06-08 MED ORDER — POTASSIUM CHLORIDE CRYS ER 20 MEQ PO TBCR
20.0000 meq | EXTENDED_RELEASE_TABLET | Freq: Every day | ORAL | Status: DC
  Administered 2012-06-08 – 2012-06-10 (×3): 20 meq via ORAL
  Filled 2012-06-08 (×3): qty 1

## 2012-06-08 MED ORDER — ONDANSETRON HCL 4 MG/2ML IJ SOLN
INTRAMUSCULAR | Status: DC | PRN
  Administered 2012-06-08 (×2): 4 mg via INTRAVENOUS

## 2012-06-08 MED ORDER — DIAZEPAM 5 MG/ML IJ SOLN
2.5000 mg | Freq: Once | INTRAMUSCULAR | Status: AC
  Administered 2012-06-08: 2.5 mg via INTRAVENOUS

## 2012-06-08 MED ORDER — LIDOCAINE HCL (CARDIAC) 20 MG/ML IV SOLN
INTRAVENOUS | Status: DC | PRN
  Administered 2012-06-08: 80 mg via INTRAVENOUS

## 2012-06-08 MED ORDER — TRIAMTERENE-HCTZ 37.5-25 MG PO TABS
1.0000 | ORAL_TABLET | Freq: Every day | ORAL | Status: DC
  Administered 2012-06-08: 1 via ORAL
  Filled 2012-06-08 (×3): qty 1

## 2012-06-08 MED ORDER — FENTANYL CITRATE 0.05 MG/ML IJ SOLN
INTRAMUSCULAR | Status: AC
  Administered 2012-06-08: 25 ug via INTRAVENOUS
  Filled 2012-06-08: qty 2

## 2012-06-08 MED ORDER — OXYCODONE-ACETAMINOPHEN 5-325 MG PO TABS
ORAL_TABLET | ORAL | Status: AC
  Administered 2012-06-08: 2 via ORAL
  Filled 2012-06-08: qty 2

## 2012-06-08 MED ORDER — PROPOFOL 10 MG/ML IV BOLUS
INTRAVENOUS | Status: DC | PRN
  Administered 2012-06-08: 150 mg via INTRAVENOUS

## 2012-06-08 MED ORDER — OXYCODONE-ACETAMINOPHEN 5-325 MG PO TABS
1.0000 | ORAL_TABLET | ORAL | Status: DC | PRN
  Administered 2012-06-08 – 2012-06-09 (×3): 2 via ORAL
  Filled 2012-06-08 (×2): qty 2

## 2012-06-08 MED ORDER — BACITRACIN 50000 UNITS IM SOLR
INTRAMUSCULAR | Status: AC
  Filled 2012-06-08: qty 1

## 2012-06-08 MED ORDER — GABAPENTIN 300 MG PO CAPS
300.0000 mg | ORAL_CAPSULE | Freq: Three times a day (TID) | ORAL | Status: DC
  Administered 2012-06-08 – 2012-06-10 (×6): 300 mg via ORAL
  Filled 2012-06-08 (×9): qty 1

## 2012-06-08 MED ORDER — SODIUM CHLORIDE 0.9 % IV SOLN
INTRAVENOUS | Status: AC
  Filled 2012-06-08: qty 500

## 2012-06-08 MED ORDER — FENTANYL CITRATE 0.05 MG/ML IJ SOLN
INTRAMUSCULAR | Status: DC | PRN
  Administered 2012-06-08: 100 ug via INTRAVENOUS
  Administered 2012-06-08 (×2): 50 ug via INTRAVENOUS
  Administered 2012-06-08: 100 ug via INTRAVENOUS
  Administered 2012-06-08 (×4): 50 ug via INTRAVENOUS

## 2012-06-08 MED ORDER — SODIUM CHLORIDE 0.9 % IJ SOLN
3.0000 mL | Freq: Two times a day (BID) | INTRAMUSCULAR | Status: DC
  Administered 2012-06-08 – 2012-06-09 (×2): 3 mL via INTRAVENOUS

## 2012-06-08 MED ORDER — LACTATED RINGERS IV SOLN
INTRAVENOUS | Status: DC | PRN
  Administered 2012-06-08: 09:00:00 via INTRAVENOUS

## 2012-06-08 MED ORDER — LISINOPRIL 20 MG PO TABS
20.0000 mg | ORAL_TABLET | Freq: Every day | ORAL | Status: DC
  Administered 2012-06-08: 20 mg via ORAL
  Filled 2012-06-08 (×3): qty 1

## 2012-06-08 MED ORDER — PHENOL 1.4 % MT LIQD: 1.0000 | OROMUCOSAL | Status: DC | PRN

## 2012-06-08 MED ORDER — SUCCINYLCHOLINE CHLORIDE 20 MG/ML IJ SOLN
INTRAMUSCULAR | Status: DC | PRN
  Administered 2012-06-08: 110 mg via INTRAVENOUS

## 2012-06-08 MED ORDER — ACETAMINOPHEN 10 MG/ML IV SOLN
1000.0000 mg | Freq: Four times a day (QID) | INTRAVENOUS | Status: AC
  Administered 2012-06-08 – 2012-06-09 (×3): 1000 mg via INTRAVENOUS
  Filled 2012-06-08 (×5): qty 100

## 2012-06-08 MED ORDER — DIAZEPAM 5 MG/ML IJ SOLN
INTRAMUSCULAR | Status: AC
  Filled 2012-06-08: qty 2

## 2012-06-08 MED ORDER — POTASSIUM CHLORIDE IN NACL 20-0.9 MEQ/L-% IV SOLN
INTRAVENOUS | Status: DC
  Administered 2012-06-08: 16:00:00 via INTRAVENOUS
  Administered 2012-06-09: 1000 mL via INTRAVENOUS
  Administered 2012-06-09: 20:00:00 via INTRAVENOUS
  Filled 2012-06-08 (×6): qty 1000

## 2012-06-08 MED ORDER — PROPOFOL INFUSION 10 MG/ML OPTIME
INTRAVENOUS | Status: DC | PRN
  Administered 2012-06-08: 50 ug/kg/min via INTRAVENOUS

## 2012-06-08 MED ORDER — DEXAMETHASONE SODIUM PHOSPHATE 4 MG/ML IJ SOLN
4.0000 mg | Freq: Four times a day (QID) | INTRAMUSCULAR | Status: DC
  Administered 2012-06-09 (×2): 4 mg via INTRAVENOUS
  Filled 2012-06-08 (×8): qty 1

## 2012-06-08 MED ORDER — SODIUM CHLORIDE 0.9 % IR SOLN
Status: DC | PRN
  Administered 2012-06-08: 10:00:00

## 2012-06-08 MED ORDER — MENTHOL 3 MG MT LOZG: 1.0000 | LOZENGE | OROMUCOSAL | Status: DC | PRN

## 2012-06-08 MED ORDER — ONDANSETRON HCL 4 MG/2ML IJ SOLN: 4.0000 mg | INTRAMUSCULAR | Status: DC | PRN

## 2012-06-08 MED ORDER — DEXAMETHASONE 4 MG PO TABS
4.0000 mg | ORAL_TABLET | Freq: Four times a day (QID) | ORAL | Status: DC
  Administered 2012-06-08 – 2012-06-10 (×5): 4 mg via ORAL
  Filled 2012-06-08 (×12): qty 1

## 2012-06-08 MED ORDER — ACETAMINOPHEN 325 MG PO TABS
650.0000 mg | ORAL_TABLET | ORAL | Status: DC | PRN
  Administered 2012-06-09: 650 mg via ORAL
  Filled 2012-06-08: qty 2

## 2012-06-08 MED ORDER — ARTIFICIAL TEARS OP OINT
TOPICAL_OINTMENT | OPHTHALMIC | Status: DC | PRN
  Administered 2012-06-08: 1 via OPHTHALMIC

## 2012-06-08 MED ORDER — SODIUM CHLORIDE 0.9 % IV SOLN: 250.0000 mL | INTRAVENOUS | Status: DC

## 2012-06-08 MED ORDER — LIDOCAINE HCL 4 % MT SOLN
OROMUCOSAL | Status: DC | PRN
  Administered 2012-06-08: 4 mL via TOPICAL

## 2012-06-08 MED ORDER — MIDAZOLAM HCL 5 MG/5ML IJ SOLN
INTRAMUSCULAR | Status: DC | PRN
  Administered 2012-06-08 (×2): 1 mg via INTRAVENOUS

## 2012-06-08 MED ORDER — SENNA 8.6 MG PO TABS
1.0000 | ORAL_TABLET | Freq: Two times a day (BID) | ORAL | Status: DC
  Administered 2012-06-08 – 2012-06-10 (×5): 8.6 mg via ORAL
  Filled 2012-06-08 (×6): qty 1

## 2012-06-08 MED ORDER — METAXALONE 800 MG PO TABS
800.0000 mg | ORAL_TABLET | Freq: Three times a day (TID) | ORAL | Status: DC
  Administered 2012-06-08 – 2012-06-10 (×6): 800 mg via ORAL
  Filled 2012-06-08 (×9): qty 1

## 2012-06-08 MED ORDER — THROMBIN 5000 UNITS EX SOLR
OROMUCOSAL | Status: DC | PRN
  Administered 2012-06-08: 10:00:00 via TOPICAL

## 2012-06-08 MED ORDER — LACTATED RINGERS IV SOLN
INTRAVENOUS | Status: DC | PRN
  Administered 2012-06-08 (×2): via INTRAVENOUS

## 2012-06-08 MED ORDER — SODIUM CHLORIDE 0.9 % IJ SOLN: 3.0000 mL | INTRAMUSCULAR | Status: DC | PRN

## 2012-06-08 MED ORDER — FENTANYL CITRATE 0.05 MG/ML IJ SOLN
25.0000 ug | INTRAMUSCULAR | Status: AC | PRN
  Administered 2012-06-08 (×6): 25 ug via INTRAVENOUS

## 2012-06-08 MED ORDER — MORPHINE SULFATE 2 MG/ML IJ SOLN
1.0000 mg | INTRAMUSCULAR | Status: DC | PRN
  Administered 2012-06-08 (×2): 2 mg via INTRAVENOUS
  Administered 2012-06-08 – 2012-06-10 (×5): 4 mg via INTRAVENOUS
  Filled 2012-06-08 (×2): qty 2
  Filled 2012-06-08: qty 1
  Filled 2012-06-08 (×3): qty 2
  Filled 2012-06-08: qty 1

## 2012-06-08 MED ORDER — ACETAMINOPHEN 10 MG/ML IV SOLN
INTRAVENOUS | Status: AC
  Administered 2012-06-08: 1000 mg via INTRAVENOUS
  Filled 2012-06-08: qty 100

## 2012-06-08 MED ORDER — HEMOSTATIC AGENTS (NO CHARGE) OPTIME: TOPICAL | Status: DC | PRN

## 2012-06-08 MED ORDER — 0.9 % SODIUM CHLORIDE (POUR BTL) OPTIME
TOPICAL | Status: DC | PRN
  Administered 2012-06-08: 1000 mL

## 2012-06-08 MED ORDER — ACETAMINOPHEN 650 MG RE SUPP: 650.0000 mg | RECTAL | Status: DC | PRN

## 2012-06-08 MED ORDER — CELECOXIB 200 MG PO CAPS
200.0000 mg | ORAL_CAPSULE | Freq: Two times a day (BID) | ORAL | Status: DC
  Administered 2012-06-08 – 2012-06-10 (×5): 200 mg via ORAL
  Filled 2012-06-08 (×7): qty 1

## 2012-06-08 SURGICAL SUPPLY — 71 items
ADH SKN CLS APL DERMABOND .7 (GAUZE/BANDAGES/DRESSINGS) ×1
ADH SKN CLS LQ APL DERMABOND (GAUZE/BANDAGES/DRESSINGS) ×2
APL SKNCLS STERI-STRIP NONHPOA (GAUZE/BANDAGES/DRESSINGS) ×2
BAG DECANTER FOR FLEXI CONT (MISCELLANEOUS) ×3 IMPLANT
BENZOIN TINCTURE PRP APPL 2/3 (GAUZE/BANDAGES/DRESSINGS) ×3 IMPLANT
BLADE SURG ROTATE 9660 (MISCELLANEOUS) IMPLANT
BONE MATRIX OSTEOCEL PLUS 10CC (Bone Implant) ×3 IMPLANT
CLOTH BEACON ORANGE TIMEOUT ST (SAFETY) ×3 IMPLANT
CONT SPEC 4OZ CLIKSEAL STRL BL (MISCELLANEOUS) IMPLANT
COROENT XL WIDE LORDO 10X22X45 (Intraocular Lens) ×3 IMPLANT
COROENT XL-W 8X22X50 (Orthopedic Implant) ×3 IMPLANT
COVER BACK TABLE 24X17X13 BIG (DRAPES) IMPLANT
COVER TABLE BACK 60X90 (DRAPES) ×3 IMPLANT
DERMABOND ADHESIVE PROPEN (GAUZE/BANDAGES/DRESSINGS) ×1
DERMABOND ADVANCED (GAUZE/BANDAGES/DRESSINGS) ×1
DERMABOND ADVANCED .7 DNX12 (GAUZE/BANDAGES/DRESSINGS) ×2 IMPLANT
DERMABOND ADVANCED .7 DNX6 (GAUZE/BANDAGES/DRESSINGS) ×2 IMPLANT
DRAPE C-ARM 42X72 X-RAY (DRAPES) ×3 IMPLANT
DRAPE C-ARMOR (DRAPES) ×3 IMPLANT
DRAPE LAPAROTOMY 100X72X124 (DRAPES) ×3 IMPLANT
DRAPE POUCH INSTRU U-SHP 10X18 (DRAPES) ×3 IMPLANT
DRAPE SURG 17X23 STRL (DRAPES) ×12 IMPLANT
DRESSING TELFA 8X3 (GAUZE/BANDAGES/DRESSINGS) IMPLANT
DRSG OPSITE 4X5.5 SM (GAUZE/BANDAGES/DRESSINGS) IMPLANT
DURAPREP 26ML APPLICATOR (WOUND CARE) ×3 IMPLANT
ELECT BLADE 4.0 EZ CLEAN MEGAD (MISCELLANEOUS)
ELECT REM PT RETURN 9FT ADLT (ELECTROSURGICAL) ×3
ELECTRODE BLDE 4.0 EZ CLN MEGD (MISCELLANEOUS) IMPLANT
ELECTRODE REM PT RTRN 9FT ADLT (ELECTROSURGICAL) ×2 IMPLANT
EVACUATOR 1/8 PVC DRAIN (DRAIN) IMPLANT
GAUZE SPONGE 4X4 16PLY XRAY LF (GAUZE/BANDAGES/DRESSINGS) IMPLANT
GLOVE BIO SURGEON STRL SZ8 (GLOVE) ×9 IMPLANT
GLOVE BIOGEL M 8.0 STRL (GLOVE) ×3 IMPLANT
GOWN BRE IMP SLV AUR LG STRL (GOWN DISPOSABLE) IMPLANT
GOWN BRE IMP SLV AUR XL STRL (GOWN DISPOSABLE) ×6 IMPLANT
GOWN STRL REIN 2XL LVL4 (GOWN DISPOSABLE) ×3 IMPLANT
GUIDEWIRE NITINOL BEVEL TIP (WIRE) ×9 IMPLANT
HEMOSTAT POWDER KIT SURGIFOAM (HEMOSTASIS) IMPLANT
KIT BASIN OR (CUSTOM PROCEDURE TRAY) ×3 IMPLANT
KIT DILATOR XLIF 5 (KITS) ×2 IMPLANT
KIT MAXCESS (KITS) ×3 IMPLANT
KIT NEEDLE NVM5 EMG ELECT (KITS) ×2 IMPLANT
KIT NEEDLE NVM5 EMG ELECTRODE (KITS) ×1
KIT ROOM TURNOVER OR (KITS) ×3 IMPLANT
KIT XLIF (KITS) ×1
NEEDLE HYPO 22GX1.5 SAFETY (NEEDLE) IMPLANT
NEEDLE HYPO 25X1 1.5 SAFETY (NEEDLE) IMPLANT
NEEDLE I-PASS III (NEEDLE) ×3 IMPLANT
NS IRRIG 1000ML POUR BTL (IV SOLUTION) ×3 IMPLANT
PACK LAMINECTOMY NEURO (CUSTOM PROCEDURE TRAY) ×3 IMPLANT
PUTTY 5ML ACTIFUSE ABX (Putty) ×3 IMPLANT
PUTTY BONE DBX 5CC MIX (Putty) ×3 IMPLANT
ROD 75MM (Rod) ×3 IMPLANT
SCREW PRECEPT 6.5X45 (Screw) ×9 IMPLANT
SCREW PRECEPT SET (Screw) ×12 IMPLANT
SPONGE GAUZE 4X4 12PLY (GAUZE/BANDAGES/DRESSINGS) IMPLANT
SPONGE LAP 4X18 X RAY DECT (DISPOSABLE) IMPLANT
SPONGE SURGIFOAM ABS GEL SZ50 (HEMOSTASIS) IMPLANT
STAPLER SKIN PROX WIDE 3.9 (STAPLE) IMPLANT
STRIP CLOSURE SKIN 1/2X4 (GAUZE/BANDAGES/DRESSINGS) ×3 IMPLANT
SUT VIC AB 0 CT1 18XCR BRD8 (SUTURE) ×4 IMPLANT
SUT VIC AB 0 CT1 8-18 (SUTURE) ×4
SUT VIC AB 2-0 CP2 18 (SUTURE) ×6 IMPLANT
SUT VIC AB 3-0 SH 8-18 (SUTURE) ×6 IMPLANT
SYR 20ML ECCENTRIC (SYRINGE) ×3 IMPLANT
TAPE CLOTH 3X10 TAN LF (GAUZE/BANDAGES/DRESSINGS) ×6 IMPLANT
TOWEL OR 17X24 6PK STRL BLUE (TOWEL DISPOSABLE) ×3 IMPLANT
TOWEL OR 17X26 10 PK STRL BLUE (TOWEL DISPOSABLE) ×3 IMPLANT
TRAP SPECIMEN MUCOUS 40CC (MISCELLANEOUS) IMPLANT
TRAY FOLEY CATH 14FRSI W/METER (CATHETERS) ×3 IMPLANT
WATER STERILE IRR 1000ML POUR (IV SOLUTION) ×3 IMPLANT

## 2012-06-08 NOTE — Anesthesia Procedure Notes (Signed)
Procedure Name: Intubation Date/Time: 06/08/2012 8:45 AM Performed by: Margaree Mackintosh Pre-anesthesia Checklist: Patient identified, Timeout performed, Emergency Drugs available, Suction available and Patient being monitored Patient Re-evaluated:Patient Re-evaluated prior to inductionOxygen Delivery Method: Circle system utilized Preoxygenation: Pre-oxygenation with 100% oxygen Intubation Type: IV induction Ventilation: Mask ventilation without difficulty Laryngoscope Size: Mac and 3 Grade View: Grade I Tube type: Oral Tube size: 7.0 mm Number of attempts: 1 Airway Equipment and Method: Stylet and LTA kit utilized Placement Confirmation: ETT inserted through vocal cords under direct vision,  positive ETCO2 and breath sounds checked- equal and bilateral Secured at: 21 cm Tube secured with: Tape Dental Injury: Teeth and Oropharynx as per pre-operative assessment

## 2012-06-08 NOTE — Anesthesia Preprocedure Evaluation (Addendum)
Anesthesia Evaluation  Patient identified by MRN, date of birth, ID band Patient awake    Reviewed: Allergy & Precautions, H&P , NPO status , Patient's Chart, lab work & pertinent test results  Airway Mallampati: II      Dental No notable dental hx.    Pulmonary  CXR noted. Dr. Yetta Barre talked to the patient in detail about the findings and follow up details and issues. Confirmed all with patient of this discussion that occurred preop 0815 CE          Cardiovascular hypertension, Pt. on medications Rhythm:Regular Rate:Normal     Neuro/Psych    GI/Hepatic negative GI ROS, Neg liver ROS,   Endo/Other  negative endocrine ROS  Renal/GU negative Renal ROS     Musculoskeletal   Abdominal   Peds  Hematology   Anesthesia Other Findings   Reproductive/Obstetrics                          Anesthesia Physical Anesthesia Plan  ASA: III  Anesthesia Plan: General   Post-op Pain Management:    Induction: Intravenous  Airway Management Planned: Oral ETT  Additional Equipment:   Intra-op Plan:   Post-operative Plan: Post-operative intubation/ventilation  Informed Consent: I have reviewed the patients History and Physical, chart, labs and discussed the procedure including the risks, benefits and alternatives for the proposed anesthesia with the patient or authorized representative who has indicated his/her understanding and acceptance.   Dental advisory given  Plan Discussed with: CRNA, Anesthesiologist and Surgeon  Anesthesia Plan Comments:         Anesthesia Quick Evaluation

## 2012-06-08 NOTE — Progress Notes (Signed)
UR COMPLETED  

## 2012-06-08 NOTE — Preoperative (Signed)
Beta Blockers   Reason not to administer Beta Blockers:Not Applicable 

## 2012-06-08 NOTE — Progress Notes (Signed)
ANTIBIOTIC CONSULT NOTE - INITIAL  Pharmacy Consult for vancomycin Indication: post op prophylaxis x 1 dose  Allergies  Allergen Reactions  . Codeine Shortness Of Breath, Nausea And Vomiting and Other (See Comments)    Low blood pressure  . Hydrocodone-Homatropine Shortness Of Breath and Other (See Comments)    Dangerously low blood pressure, respiratory distress   . Cefazolin Hives, Nausea And Vomiting and Other (See Comments)    Also, severe stomach pain  . Iodinated Diagnostic Agents Hives    Pre-medicated with Benadryl 50mg  PO  . Penicillins Hives, Nausea And Vomiting and Other (See Comments)    Also, severe stomach pain  . Propoxyphene-Acetaminophen Nausea And Vomiting and Other (See Comments)    Lightheadedness, confusion  . Robaxin (Methocarbamol) Other (See Comments)    "severe Altzheimer's symptoms"  . Carbocaine (Mepivacaine Hcl) Hives and Nausea And Vomiting  . Other     Sentura, heavy cramping diarrhea, HA  . Tramadol     Low respiratory rate, nausea, chest heaviness  . Nsaids Other (See Comments)    Bruising; use with caution  . Tape Rash and Other (See Comments)    Surgical tape causes rash and big blisters    Patient Measurements:   Adjusted Body Weight:   Vital Signs: Temp: 98.3 F (36.8 C) (12/18 1410) Temp src: Oral (12/18 0634) BP: 130/78 mmHg (12/18 1410) Pulse Rate: 75  (12/18 1410) Intake/Output from previous day:   Intake/Output from this shift: Total I/O In: 2000 [I.V.:2000] Out: 575 [Urine:475; Blood:100]  Labs: No results found for this basename: WBC:3,HGB:3,PLT:3,LABCREA:3,CREATININE:3 in the last 72 hours The CrCl is unknown because both a height and weight (above a minimum accepted value) are required for this calculation. No results found for this basename: VANCOTROUGH:2,VANCOPEAK:2,VANCORANDOM:2,GENTTROUGH:2,GENTPEAK:2,GENTRANDOM:2,TOBRATROUGH:2,TOBRAPEAK:2,TOBRARND:2,AMIKACINPEAK:2,AMIKACINTROU:2,AMIKACIN:2, in the last 72 hours    Microbiology: Recent Results (from the past 720 hour(s))  SURGICAL PCR SCREEN     Status: Normal   Collection Time   06/02/12 11:57 AM      Component Value Range Status Comment   MRSA, PCR NEGATIVE  NEGATIVE Final    Staphylococcus aureus NEGATIVE  NEGATIVE Final     Medical History: Past Medical History  Diagnosis Date  . Chronic neck pain     Dr. Marikay Alar  . Low back pain   . DJD (degenerative joint disease)     see Dr. Kellie Simmering  . Headache     Dr. Sandria Manly  . Memory loss   . Tumor of lung     carcinoid see Dr. Dola Factor in [place]  . Restless leg     takes tonic water at bedtime  . Insomnia   . Complication of anesthesia     developed Hives and vomiting after gallbladder surgery was told they believe it was due to either ancef or carbocaine  . Family history of anesthesia complication     daughter has problems with anesthesia   . Hypertension     Medications:  Scheduled:    . [COMPLETED] acetaminophen      . acetaminophen  1,000 mg Intravenous Q6H  . bacitracin      . celecoxib  200 mg Oral BID  . [COMPLETED] dexamethasone  10 mg Intravenous To OR  . dexamethasone  4 mg Intravenous Q6H   Or  . dexamethasone  4 mg Oral Q6H  . diazepam      . [COMPLETED] diazepam  2.5 mg Intravenous Once  . gabapentin  300 mg Oral TID  . lisinopril  20 mg  Oral Daily  . metaxalone  800 mg Oral TID  . potassium chloride SA  20 mEq Oral Daily  . senna  1 tablet Oral BID  . sodium chloride      . sodium chloride  3 mL Intravenous Q12H  . triamterene-hydrochlorothiazide  1 each Oral Daily  . [COMPLETED] vancomycin  1,000 mg Intravenous On Call to OR   Infusions:    . sodium chloride    . 0.9 % NaCl with KCl 20 mEq / L     Assessment: 71 yo female s/p lumbar fusion will be put on vancomycin x 1 dose for post-op prophylaxis.  Patient received vancomycin 1g iv x1 at 0849.  SCr on 12/12 was 1.2 (CrCl ~39)  Goal of Therapy:  Vancomycin trough level 15-20 mcg/ml  Plan:  1)  vancomycin 1250mg  iv x1 at 0700 on 06/09/12.  Pharmacy will sign off.  Kattia Selley, Tsz-Yin 06/08/2012,2:45 PM

## 2012-06-08 NOTE — Transfer of Care (Signed)
Immediate Anesthesia Transfer of Care Note  Patient: Karen Watson  Procedure(s) Performed: Procedure(s) (LRB) with comments: ANTERIOR LATERAL LUMBAR FUSION 2 LEVELS (Right) - Right Lumbar two-three, lumbar three-four extreme lateral interbody fusion with percutaneous pedicle screws LUMBAR PERCUTANEOUS PEDICLE SCREW 1 LEVEL (Right) - Right Lumbar two-three, lumbar three-four extreme lateral interbody fusion with percutaneous pedicle screws  Patient Location: PACU  Anesthesia Type:General  Level of Consciousness: lethargic and responds to stimulation  Airway & Oxygen Therapy: Patient Spontanous Breathing, Patient connected to face mask oxygen and oral airway in place  Post-op Assessment: Report given to PACU RN, Post -op Vital signs reviewed and stable and Patient moving all extremities X 4  Post vital signs: Reviewed and stable  Complications: No apparent anesthesia complications

## 2012-06-08 NOTE — Anesthesia Postprocedure Evaluation (Signed)
  Anesthesia Post-op Note  Patient: Karen Watson  Procedure(s) Performed: Procedure(s) (LRB) with comments: ANTERIOR LATERAL LUMBAR FUSION 2 LEVELS (Right) - Right Lumbar two-three, lumbar three-four extreme lateral interbody fusion with percutaneous pedicle screws LUMBAR PERCUTANEOUS PEDICLE SCREW 1 LEVEL (Right) - Right Lumbar two-three, lumbar three-four extreme lateral interbody fusion with percutaneous pedicle screws  Patient Location: PACU  Anesthesia Type:General  Level of Consciousness: awake  Airway and Oxygen Therapy: Patient Spontanous Breathing  Post-op Pain: mild  Post-op Assessment: Post-op Vital signs reviewed  Post-op Vital Signs: Reviewed  Complications: No apparent anesthesia complications

## 2012-06-08 NOTE — Op Note (Signed)
06/08/2012  11:59 AM  PATIENT:  Karen Watson  71 y.o. 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  POST-OPERATIVE DIAGNOSIS:  same  PROCEDURE:  1. Anterolateral retroperitoneal interbody fusion L2-3, L3-4 using Peek interbody cages packed with morselized allograft, 2. Segmental fixation using percutaneous pedical screws  SURGEON:  Marikay Alar, MD  ASSISTANTS: Dr Jeral Fruit.  ANESTHESIA:   General  EBL: 100 ml  Total I/O In: -  Out: 325 [Urine:225; Blood:100]  BLOOD ADMINISTERED:none  DRAINS: none   SPECIMEN:  No Specimen  INDICATION FOR PROCEDURE: This patient presented with back pain radiating into the right leg. Plain films and MRI showed degenerative disc disease and scoliosis and worsening at L2-L3 for over time. I recommended a two-level instrumented fusion. Patient understood the risks, benefits, and alternatives and potential outcomes and wished to proceed.  PROCEDURE DETAILS: The patient was brought to the operating room and after induction of adequate generalized endotracheal anesthesia, the patient was positioned in the left lateral decubitus position exposing the right side. The patient was positioned in the typical XLIF fashion and taped into position and trajectories were confirmed with AP lateral fluoroscopy. The skin was cleaned and then prepped with DuraPrep and draped in the usual sterile fashion. 5 cc of local anesthesia was injected. A lateral incision was made over the appropriate disc space and a incision was made posterior lateral to this. Blunt finger dissection was used to enter the retroperitoneal space. I could palpate the psoas musculature as well as the anterior face of the transverse process, and I could feel the fatty tissues of the retroperitoneum. I swept my finger up to the lateral incision and passed my first dilator down to psoas musculature utilizing my index finger and also through  the L3-4 level which is where I started. However the exact same procedure was done at both L2-3 and L3-4. We then used EMG monitoring to monitor our dilator. We checked our position with fluoroscopy and then placed a K wire into the disc space, and then used sequential dilators until our final retractor was in place. We then positioned it utilizing AP lateral fluoroscopy, and used our ball probe to make sure there were no neural structures within the working channel. I then placed the intradiscal shim, and opened my retractor further. The annulus was then incised and the discectomy was done with pituitaries. The annulus was removed from the endplates with a Cobb and the opposite annulus was opened and released. I then used a series of scrapers and shavers to prepare the endplates, taking care not to violate the endplates. I then placed my 16 mm paddle across the disc space to further open opposite annulus and I checked the trajectory with AP and lateral fluoroscopy. I then used sequential trials to determine the correct size cage. The 8 mm standard trial fit the best at L3-4 and a 10 mm lordotic fit best at L2-3, so a corresponding cage was selected for each disc space and packed with morcellized allograft. Using sliders it was then tapped gently into the disc space at each level utilizing AP fluoroscopy until it was appropriately positioned. I then irrigated with saline solution containing bacitracin and dried any bleeding points. I checked my final construct with AP lateral fluoroscopy, removed my retractor. I turned my attention to the pedicle screw placement. AP and lateral fluoroscopy was used to determine landmarks. Stab incisions were made over the pedicles and a Jamshidi needle  was passed down to identify the transverse process and the pedicle screw entry zone. Utilizing AP fluoroscopy, we started the needle at the lateral border of the pedicle and tapped it into the pedicle into it reached the medial border  of the pedicle on the AP image. EMG monitoring was used. We then checked our lateral fluoroscopy to assure our needle was in good position, and then passed our K wire into the vertebral body at each level. We did this for each pedicle in the same way. We were then able to tap over each K wire, measured the correct length of our screws, and then place our pedicle screws over the K wires until they were in correct position. We then passed our rod through the towers and locked into position with locking caps and antitorque device. We then checked our final construct with AP lateral fluoroscopy. Then irrigated with saline solution containing bacitracin and dried all bleeding points. The wounds were then closed in layers of 0 Vicryl in the fascia, 2-0 Vicryl in the subcutaneous tissues, and 3-0 Vicryl in the subcuticular tissues. The skin was closed with Dermabond. A sterile dressing was applied and the patient was awakened from general anesthesia and transferred to the recovery in stable condition. At the end of the procedure all sponge, needle and instrument counts were correct.  PLAN OF CARE: Admit to inpatient   PATIENT DISPOSITION:  PACU - hemodynamically stable.   Delay start of Pharmacological VTE agent (>24hrs) due to surgical blood loss or risk of bleeding:  yes

## 2012-06-08 NOTE — H&P (Signed)
Subjective: Patient is a 71 y.o. female admitted for XLIF L2-3 L3-4. Onset of symptoms was several months ago, gradually worsening since that time.  The pain is rated moderate, severe, and is located at the across the lower back and radiates to legs. The pain is described as aching and soreness and occurs all day. The symptoms have been progressive. Symptoms are exacerbated by exercise and standing. 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   Past Medical History  Diagnosis Date  . Chronic neck pain     Dr. Marikay Alar  . Low back pain   . DJD (degenerative joint disease)     see Dr. Kellie Simmering  . Headache     Dr. Sandria Manly  . Memory loss   . Tumor of lung     carcinoid see Dr. Dola Factor in [place]  . Restless leg     takes tonic water at bedtime  . Insomnia   . Complication of anesthesia     developed Hives and vomiting after gallbladder surgery was told they believe it was due to either ancef or carbocaine  . Family history of anesthesia complication     daughter has problems with anesthesia   . Hypertension     Past Surgical History  Procedure Date  . Dexa 01/2009    normal  . Right lower lobectomy   . Tubal ligation   . Tonsillectomy   . Cholecystectomy   . Colonscopy 11/2001    per Dr. Dalene Carrow in [place] repeat in 10 years  . Dilation and curettage of uterus   . Cataract extraction 2011    bilateral per Dr. Pecola Leisure in [place]  . Removal of basal cell cancer from left upper back 2011  2011    per Dr. Danella Deis    Prior to Admission medications   Medication Sig Start Date End Date Taking? Authorizing Provider  acetaminophen (TYLENOL) 500 MG tablet Take 1,000 mg by mouth 3 (three) times daily as needed. For pain   Yes Historical Provider, MD  b complex vitamins tablet Take 1 tablet by mouth daily.     Yes Historical Provider, MD  calcium-vitamin D (OSCAL WITH D) 250-125 MG-UNIT per tablet Take 1 tablet by mouth daily.     Yes Historical  Provider, MD  fish oil-omega-3 fatty acids 1000 MG capsule Take 1 g by mouth daily.    Yes Historical Provider, MD  gabapentin (NEURONTIN) 300 MG capsule Take 1 capsule (300 mg total) by mouth 3 (three) times daily. 05/05/12  Yes Nelwyn Salisbury, MD  lisinopril (PRINIVIL,ZESTRIL) 20 MG tablet Take 1 tablet (20 mg total) by mouth daily. 05/05/12  Yes Nelwyn Salisbury, MD  MAGNESIUM-ZINC PO Take 1 tablet by mouth daily.    Yes Historical Provider, MD  meloxicam (MOBIC) 15 MG tablet Take 1 tablet (15 mg total) by mouth daily. 05/05/12  Yes Nelwyn Salisbury, MD  metaxalone (SKELAXIN) 800 MG tablet Take 800 mg by mouth 3 (three) times daily.   Yes Historical Provider, MD  MULTIPLE VITAMIN PO Take 1 tablet by mouth daily.    Yes Historical Provider, MD  potassium chloride SA (K-DUR,KLOR-CON) 20 MEQ tablet Take 1 tablet (20 mEq total) by mouth daily. 05/05/12  Yes Nelwyn Salisbury, MD  triamterene-hydrochlorothiazide (MAXZIDE-25) 37.5-25 MG per tablet Take 1 each (1 tablet total) by mouth daily. 05/05/12  Yes Nelwyn Salisbury, MD  vitamin C (ASCORBIC ACID) 500 MG tablet Take 500 mg  by mouth daily.     Yes Historical Provider, MD  vitamin E 400 UNIT capsule Take 400 Units by mouth daily.     Yes Historical Provider, MD   Allergies  Allergen Reactions  . Codeine Shortness Of Breath, Nausea And Vomiting and Other (See Comments)    Low blood pressure  . Hydrocodone-Homatropine Shortness Of Breath and Other (See Comments)    Dangerously low blood pressure, respiratory distress   . Cefazolin Hives, Nausea And Vomiting and Other (See Comments)    Also, severe stomach pain  . Iodinated Diagnostic Agents Hives    Pre-medicated with Benadryl 50mg  PO  . Penicillins Hives, Nausea And Vomiting and Other (See Comments)    Also, severe stomach pain  . Propoxyphene-Acetaminophen Nausea And Vomiting and Other (See Comments)    Lightheadedness, confusion  . Robaxin (Methocarbamol) Other (See Comments)    "severe Altzheimer's  symptoms"  . Carbocaine (Mepivacaine Hcl) Hives and Nausea And Vomiting  . Other     Sentura, heavy cramping diarrhea, HA  . Tramadol     Low respiratory rate, nausea, chest heaviness  . Nsaids Other (See Comments)    Bruising; use with caution  . Tape Rash and Other (See Comments)    Surgical tape causes rash and big blisters    History  Substance Use Topics  . Smoking status: Never Smoker   . Smokeless tobacco: Never Used  . Alcohol Use: No    Family History  Problem Relation Age of Onset  . Multiple sclerosis Brother   . Asthma    . Coronary artery disease    . Coronary artery disease    . Diabetes    . Hypertension    . Melanoma    . Osteoporosis    . Uterine cancer    . Kidney cancer       Review of Systems  Positive ROS: neg  All other systems have been reviewed and were otherwise negative with the exception of those mentioned in the HPI and as above.  Objective: Vital signs in last 24 hours: Temp:  [98 F (36.7 C)] 98 F (36.7 C) (12/18 0634) Pulse Rate:  [65] 65  (12/18 0634) Resp:  [18] 18  (12/18 0634) BP: (134)/(81) 134/81 mmHg (12/18 0634) SpO2:  [95 %] 95 % (12/18 0634)  General Appearance: Alert, cooperative, no distress, appears stated age Head: Normocephalic, without obvious abnormality, atraumatic Eyes: PERRL, conjunctiva/corneas clear, EOM's intact, fundi benign, both eyes      Ears: Normal TM's and external ear canals, both ears Throat: Lips, mucosa, and tongue normal; teeth and gums normal Neck: Supple, symmetrical, trachea midline, no adenopathy; thyroid: No enlargement/tenderness/nodules; no carotid bruit or JVD Back: Symmetric, no curvature, ROM normal, no CVA tenderness Lungs: Clear to auscultation bilaterally, respirations unlabored Heart: Regular rate and rhythm, S1 and S2 normal, no murmur, rub or gallop Abdomen: Soft, non-tender, bowel sounds active all four quadrants, no masses, no organomegaly Extremities: Extremities normal,  atraumatic, no cyanosis or edema Pulses: 2+ and symmetric all extremities Skin: Skin color, texture, turgor normal, no rashes or lesions  NEUROLOGIC:   Mental status: Alert and oriented x4,  no aphasia, good attention span, fund of knowledge, and memory Motor Exam - grossly normal Sensory Exam - grossly normal Reflexes: 1+ Coordination - grossly normal Gait - grossly normal Balance - grossly normal Cranial Nerves: I: smell Not tested  II: visual acuity  OS: nl    OD: nl  II: visual fields Full to  confrontation  II: pupils Equal, round, reactive to light  III,VII: ptosis None  III,IV,VI: extraocular muscles  Full ROM  V: mastication Normal  V: facial light touch sensation  Normal  V,VII: corneal reflex  Present  VII: facial muscle function - upper  Normal  VII: facial muscle function - lower Normal  VIII: hearing Not tested  IX: soft palate elevation  Normal  IX,X: gag reflex Present  XI: trapezius strength  5/5  XI: sternocleidomastoid strength 5/5  XI: neck flexion strength  5/5  XII: tongue strength  Normal    Data Review Lab Results  Component Value Date   WBC 6.0 06/02/2012   HGB 11.7* 06/02/2012   HCT 37.6 06/02/2012   MCV 98.7 06/02/2012   PLT 237 06/02/2012   Lab Results  Component Value Date   NA 144 06/02/2012   K 5.0 06/02/2012   CL 108 06/02/2012   CO2 26 06/02/2012   BUN 30* 06/02/2012   CREATININE 1.20* 06/02/2012   GLUCOSE 90 06/02/2012   Lab Results  Component Value Date   INR 0.91 06/02/2012    Assessment/Plan: Patient admitted for XLIF L2-3 L3-4. Patient has failed conservative therapy.  I explained the condition and procedure to the patient and answered any questions.  Patient wishes to proceed with procedure as planned. Understands risks/ benefits and typical outcomes of procedure.   Brooks Stotz S 06/08/2012 8:06 AM

## 2012-06-09 ENCOUNTER — Encounter (HOSPITAL_COMMUNITY): Payer: Self-pay | Admitting: Neurological Surgery

## 2012-06-09 MED ORDER — OXYCODONE HCL 5 MG PO TABS
10.0000 mg | ORAL_TABLET | ORAL | Status: DC | PRN
  Administered 2012-06-09 – 2012-06-10 (×6): 10 mg via ORAL
  Filled 2012-06-09 (×6): qty 2

## 2012-06-09 NOTE — Progress Notes (Signed)
Patient ID: Karen Watson, female   DOB: 05-19-1941, 71 y.o.   MRN: [id number] Subjective: Patient reports appropriate back soreness, no leg pain except expected R ant thigh/ HF pain.   Objective: Vital signs in last 24 hours: Temp:  [97.9 F (36.6 C)-99.6 F (37.6 C)] 98.1 F (36.7 C) (12/19 1338) Pulse Rate:  [55-78] 57  (12/19 1338) Resp:  [16-18] 18  (12/19 1338) BP: (90-124)/(44-78) 94/46 mmHg (12/19 1338) SpO2:  [96 %-100 %] 96 % (12/19 1338) Weight:  [86.8 kg (191 lb 5.8 oz)] 86.8 kg (191 lb 5.8 oz) (12/19 0214)  Intake/Output from previous day: 12/18 0701 - 12/19 0700 In: 2000 [I.V.:2000] Out: 2825 [Urine:2725; Blood:100] Intake/Output this shift:    Neurologic: Grossly normal  Lab Results: Lab Results  Component Value Date   WBC 6.0 06/02/2012   HGB 11.7* 06/02/2012   HCT 37.6 06/02/2012   MCV 98.7 06/02/2012   PLT 237 06/02/2012   Lab Results  Component Value Date   INR 0.91 06/02/2012   BMET Lab Results  Component Value Date   NA 144 06/02/2012   K 5.0 06/02/2012   CL 108 06/02/2012   CO2 26 06/02/2012   GLUCOSE 90 06/02/2012   BUN 30* 06/02/2012   CREATININE 1.20* 06/02/2012   CALCIUM 10.9* 06/02/2012    Studies/Results: Dg Lumbar Spine 2-3 Views  06/08/2012  *RADIOLOGY REPORT*  Clinical Data: Back pain  LUMBAR SPINE - 2-3 VIEW,DG C-ARM GT 120 MIN  Comparison: Multiple priors.  Findings: C-arm films document L2-3-4 XLIF.  Satisfactory position and alignment.  IMPRESSION: As above.   Original Report Authenticated By: Davonna Belling, M.D.    Ct Chest Wo Contrast  06/07/2012  *RADIOLOGY REPORT*  Clinical Data: Abnormal chest x-ray. History of right lung cancer.  CT CHEST WITHOUT CONTRAST  Technique:  Multidetector CT imaging of the chest was performed following the standard protocol without IV contrast.  Comparison: 06/02/2012 the  Findings: There is a rounded well circumscribed nodule in the right lower lobe corresponding to the density seen on chest  x-ray.  This measures 10 mm.  Postoperative changes are noted in the right lung. Scarring at the bases, right greater than left.  No pleural effusions. There are a few scattered tiny left lower lobe pulmonary nodules (3 mm or less), nonspecific, but possibly post inflammatory.  Otherwise, left lung is clear.  Heart is normal size. Aorta is normal caliber. No mediastinal, hilar, or axillary adenopathy.  Visualized thyroid and chest wall soft tissues unremarkable. Imaging into the upper abdomen shows no acute findings.  Prior cholecystectomy.  No acute or focal bony abnormality.  Postoperative changes in the right ribs.  IMPRESSION: Density on chest x-ray corresponds to a 10 mm rounded loss of circumscribed nodule in the right lower lobe.  While this could reflect a benign process given its appearance, I cannot exclude neoplasm, particularly given the patient's history of prior lung cancer.  Recommend further evaluation with PET CT.  Scattered tiny nonspecific left lower lobe pulmonary nodules, possibly post inflammatory.  Recommend attention on follow-up imaging.   Original Report Authenticated By: Charlett Nose, M.D.    Dg C-arm Gt 120 Min  06/08/2012  *RADIOLOGY REPORT*  Clinical Data: Back pain  LUMBAR SPINE - 2-3 VIEW,DG C-ARM GT 120 MIN  Comparison: Multiple priors.  Findings: C-arm films document L2-3-4 XLIF.  Satisfactory position and alignment.  IMPRESSION: As above.   Original Report Authenticated By: Davonna Belling, M.D.     Assessment/Plan: Doing well,  cont PT/OT.   LOS: 1 day    JONES,DAVID S 06/09/2012, 2:32 PM

## 2012-06-09 NOTE — Evaluation (Signed)
Physical Therapy Evaluation Patient Details Name: Karen Watson MRN: [id number] DOB: 1941-01-07 Today's Date: 06/09/2012 Time: 4098-1191 PT Time Calculation (min): 33 min  PT Assessment / Plan / Recommendation Clinical Impression  Pt is a pleasent 71 y.o. female s/p lumbar XLF sx. Patient is performing at supervision level for all tasks. Feel patient will progress well and be safe for d/c home.  Will see again to reinforce precautions and perform stair training and car transfers.    PT Assessment  Patient needs continued PT services    Follow Up Recommendations  No PT follow up    Does the patient have the potential to tolerate intense rehabilitation      Barriers to Discharge None      Equipment Recommendations       Recommendations for Other Services     Frequency Min 5X/week    Precautions / Restrictions Precautions Precautions: Back Precaution Booklet Issued: Yes (comment) Precaution Comments: educated on precautions Required Braces or Orthoses: Spinal Brace Spinal Brace: Lumbar corset Restrictions Weight Bearing Restrictions: No   Pertinent Vitals/Pain 2/10      Mobility  Bed Mobility Bed Mobility: Rolling Left;Left Sidelying to Sit;Sitting - Scoot to Edge of Bed Rolling Left: 5: Supervision Left Sidelying to Sit: 5: Supervision Sitting - Scoot to Edge of Bed: 5: Supervision Details for Bed Mobility Assistance: VC's for technique and sequencing Transfers Transfers: Sit to Stand;Stand to Sit Sit to Stand: 5: Supervision Stand to Sit: 5: Supervision Details for Transfer Assistance: VC's for hand placement Ambulation/Gait Ambulation/Gait Assistance: 5: Supervision Ambulation Distance (Feet): 200 Feet Assistive device: Rolling walker;None (100 ft without device 100 ft with rw) Ambulation/Gait Assistance Details: patient steady with ambulation Gait Pattern: Within Functional Limits Gait velocity: decreased           PT Diagnosis: Acute pain;Difficulty  walking;Abnormality of gait;Generalized weakness  PT Problem List: Decreased mobility;Decreased safety awareness;Pain PT Treatment Interventions: DME instruction;Gait training;Stair training;Functional mobility training   PT Goals Acute Rehab PT Goals PT Goal Formulation: With patient Time For Goal Achievement: 06/14/12 Potential to Achieve Goals: Good Pt will Stand: with modified independence PT Goal: Stand - Progress: Goal set today Pt will Ambulate: >150 feet;with modified independence PT Goal: Ambulate - Progress: Goal set today Pt will Go Up / Down Stairs: 3-5 stairs;with modified independence PT Goal: Up/Down Stairs - Progress: Goal set today  Visit Information  Last PT Received On: 06/09/12 Assistance Needed: +1    Subjective Data  Subjective: Oh I am ready to go; I've been up a lot Patient Stated Goal: to go home   Prior Functioning  Home Living Lives With: Spouse Available Help at Discharge: Family Type of Home: House Home Access: Stairs to enter Secretary/administrator of Steps: 2 Entrance Stairs-Rails:  (has a column on one side) Home Layout: One level Bathroom Shower/Tub: Walk-in shower;Door [employer] Toilet: Standard Bathroom Accessibility: Yes How Accessible: Accessible via walker Home Adaptive Equipment: Straight cane;Walker - rolling;Bedside commode/3-in-1;Shower chair without back Additional Comments: wears splints for thumbs  Prior Function Level of Independence: Needs assistance Needs Assistance: Light Housekeeping;Meal Prep Meal Prep: Maximal Light Housekeeping: Maximal Able to Take Stairs?: Yes Driving: Yes Communication Communication: No difficulties Dominant Hand: Right    Cognition  Overall Cognitive Status: Impaired Arousal/Alertness: Awake/alert Orientation Level: Appears intact for tasks assessed;Oriented X4 / Intact Behavior During Session: [facility] for tasks performed    Extremity/Trunk Assessment Right Upper Extremity Assessment RUE  ROM/Strength/Tone: [facility] for tasks assessed;Deficits;Due to pain RUE ROM/Strength/Tone Deficits: chronic  shoulder issues RUE Sensation: Deficits (square thumb arthritis) Left Upper Extremity Assessment LUE ROM/Strength/Tone: Within functional levels LUE Sensation: Deficits Right Lower Extremity Assessment RLE ROM/Strength/Tone: WFL for tasks assessed RLE Sensation: Deficits RLE Sensation Deficits: sensation deficits in upper thigh Left Lower Extremity Assessment LLE ROM/Strength/Tone: WFL for tasks assessed   Balance Balance Balance Assessed: Yes High Level Balance High Level Balance Activites: Side stepping;Backward walking;Direction changes;Sudden stops;Turns;Head turns High Level Balance Comments: steady with all balance activities and functional tasks  End of Session PT - End of Session Equipment Utilized During Treatment: Gait belt;Back brace Activity Tolerance: Patient tolerated treatment well Patient left: in chair;with call bell/phone within reach;with family/visitor present Nurse Communication: Mobility status (desire for pt to ambulate)  GP     Fabio Asa 06/09/2012, 1:11 PM Charlotte Crumb, PT DPT  [phone]

## 2012-06-09 NOTE — Evaluation (Signed)
Occupational Therapy Evaluation Patient Details Name: Karen Watson MRN: [id number] DOB: March 14, 1941 Today's Date: 06/09/2012 Time: 7829-5621 OT Time Calculation (min): 33 min  OT Assessment / Plan / Recommendation Clinical Impression  Pt admitted for anterior lateral lumbar fusion.  Moving well on post op day 1.  Will follow for instruction in back precautions and ADL in preparation for return home with family's assist.    OT Assessment  Patient needs continued OT Services    Follow Up Recommendations  No OT follow up    Barriers to Discharge      Equipment Recommendations  None recommended by OT    Recommendations for Other Services    Frequency  Min 2X/week    Precautions / Restrictions Precautions Precautions: Back Precaution Booklet Issued: Yes (comment) Precaution Comments: educated on precautions Required Braces or Orthoses: Spinal Brace Spinal Brace: Lumbar corset;Applied in sitting position Restrictions Weight Bearing Restrictions: No   Pertinent Vitals/Pain 2/10 back, premedicated    ADL  Eating/Feeding: Independent Where Assessed - Eating/Feeding: Chair Grooming: Wash/dry hands;Supervision/safety Where Assessed - Grooming: Unsupported standing Upper Body Bathing: Set up Where Assessed - Upper Body Bathing: Unsupported sitting Lower Body Bathing: Minimal assistance Where Assessed - Lower Body Bathing: Unsupported sitting;Supported sit to stand Upper Body Dressing: Supervision/safety Where Assessed - Upper Body Dressing: Unsupported sitting Lower Body Dressing: Minimal assistance Where Assessed - Lower Body Dressing: Unsupported sitting;Supported sit to stand Toilet Transfer: Supervision/safety Equipment Used: Back brace;Gait belt;Rolling walker Transfers/Ambulation Related to ADLs: supervision and verbal cues for hand placement, used RW for part of walk ADL Comments: Pt able to cross her L foot over her right to access her foot, but not the R.  Will rely  on her family to help.    OT Diagnosis: Generalized weakness;Acute pain  OT Problem List: Impaired balance (sitting and/or standing);Pain;Decreased knowledge of use of DME or AE;Decreased knowledge of precautions OT Treatment Interventions: Self-care/ADL training;Patient/family education;DME and/or AE instruction   OT Goals Acute Rehab OT Goals OT Goal Formulation: With patient Time For Goal Achievement: 06/16/12 Potential to Achieve Goals: Good ADL Goals Pt Will Perform Upper Body Dressing: with modified independence;Other (comment) (back brace) ADL Goal: Upper Body Dressing - Progress: Goal set today Pt Will Transfer to Toilet: with modified independence;Ambulation;Comfort height toilet;Maintaining back safety precautions ADL Goal: Toilet Transfer - Progress: Goal set today Pt Will Perform Toileting - Clothing Manipulation: with modified independence;Standing ADL Goal: Toileting - Clothing Manipulation - Progress: Goal set today Pt Will Perform Toileting - Hygiene: with modified independence;Sit to stand from 3-in-1/toilet ADL Goal: Toileting - Hygiene - Progress: Goal set today Pt Will Perform Tub/Shower Transfer: with supervision;Ambulation;Shower seat without back;Maintaining back safety precautions ADL Goal: Web designer - Progress: Goal set today Miscellaneous OT Goals Miscellaneous OT Goal #1: Pt will generalize back precautions in ADL independently. OT Goal: Miscellaneous Goal #1 - Progress: Goal set today  Visit Information  Last OT Received On: 06/09/12 Assistance Needed: +1 PT/OT Co-Evaluation/Treatment: Yes    Subjective Data  Subjective: "I read the information they gave me before surgery." Patient Stated Goal: Return to PLOF.   Prior Functioning     Home Living Lives With: Spouse Available Help at Discharge: Family Type of Home: House Home Access: Stairs to enter [employer] of Steps: 2 Entrance Stairs-Rails:  (has a column on one  side) Home Layout: One level Bathroom Shower/Tub: Walk-in shower;Door [employer] Toilet: Standard Bathroom Accessibility: Yes How Accessible: Accessible via walker Home Adaptive Equipment: Straight cane;Walker - rolling;Bedside commode/3-in-1;Shower  chair without back Additional Comments: wears splints for thumbs  Prior Function Level of Independence: Needs assistance Needs Assistance: Light Housekeeping;Meal Prep Meal Prep: Maximal Light Housekeeping: Maximal Able to Take Stairs?: Yes Driving: Yes Communication Communication: No difficulties         Vision/Perception     Cognition  Overall Cognitive Status: Appears within functional limits for tasks assessed/performed Arousal/Alertness: Awake/alert Orientation Level: Appears intact for tasks assessed;Oriented X4 / Intact Behavior During Session: [facility] for tasks performed    Extremity/Trunk Assessment Right Upper Extremity Assessment RUE ROM/Strength/Tone: [facility] for tasks assessed;Deficits;Due to pain RUE ROM/Strength/Tone Deficits: chronic shoulder issues Trunk Assessment Trunk Assessment: Normal     Mobility Bed Mobility Bed Mobility: Rolling Left;Left Sidelying to Sit;Sitting - Scoot to [place] of Bed     Shoulder Instructions     Exercise     Balance     End of Session OT - End of Session Activity Tolerance: Patient tolerated treatment well Patient left: in chair;with call bell/phone within reach;with family/visitor present Nurse Communication: Mobility status  GO     Evern Bio 06/09/2012, 3:31 PM [phone]

## 2012-06-10 NOTE — Progress Notes (Signed)
Occupational Therapy Treatment and Discharge Patient Details Name: Karen Watson MRN: [id number] DOB: Sep 16, 1940 Today's Date: 06/10/2012 Time: 4098-1191 OT Time Calculation (min): 27 min  OT Assessment / Plan / Recommendation Comments on Treatment Session Pt is progressing well in all mobility and ADL independence. Generally, pt is functioning at a modified independent to supervision level.  She is knowledgeable in back precautions.  She has excellent family support.  No further OT needs.    Follow Up Recommendations  No OT follow up    Barriers to Discharge       Equipment Recommendations  None recommended by OT    Recommendations for Other Services    Frequency     Plan Discharge plan remains appropriate    Precautions / Restrictions Precautions Precautions: Back Precaution Comments: reviewed back precautions Required Braces or Orthoses: Spinal Brace Spinal Brace: Lumbar corset;Applied in sitting position   Pertinent Vitals/Pain Back pain, did not rate, RN administered meds for ride home.    ADL  Lower Body Dressing: Minimal assistance;Other (comment) (reviewed technique for donning socks/pants) Where Assessed - Lower Body Dressing: Unsupported sitting Tub/Shower Transfer: Supervision/safety Tub/Shower Transfer Method: Science writer: Walk in shower Equipment Used: Back brace Transfers/Ambulation Related to ADLs: supervision for ambulation ADL Comments: Reeducated on placement of lumbar corset. Educated on benefits of standing vs sitting for showering. Educated on picking items up from the floor following back precautions.  Discussed IADL pt will need assist for: mopping, vacuuming, laundry, garbage, etc.    OT Diagnosis:    OT Problem List:   OT Treatment Interventions:     OT Goals ADL Goals Pt Will Perform Upper Body Dressing: with modified independence;Other (comment) ADL Goal: Upper Body Dressing - Progress: Met Pt Will Transfer to  Toilet: with modified independence;Ambulation;Comfort height toilet;Maintaining back safety precautions ADL Goal: Toilet Transfer - Progress: Progressing toward goals Pt Will Perform Toileting - Clothing Manipulation: with modified independence;Standing ADL Goal: Toileting - Clothing Manipulation - Progress: Met Pt Will Perform Toileting - Hygiene: with modified independence;Sit to stand from 3-in-1/toilet ADL Goal: Toileting - Hygiene - Progress: Met Pt Will Perform Tub/Shower Transfer: with supervision;Ambulation;Shower seat without back;Maintaining back safety precautions ADL Goal: Web designer - Progress: Met Miscellaneous OT Goals Miscellaneous OT Goal #1: Pt will generalize back precautions in ADL independently. OT Goal: Miscellaneous Goal #1 - Progress: Met  Visit Information  Last OT Received On: 06/10/12 Assistance Needed: +1    Subjective Data      Prior Functioning       Cognition  Overall Cognitive Status: Appears within functional limits for tasks assessed/performed Arousal/Alertness: Awake/alert Orientation Level: Appears intact for tasks assessed;Oriented X4 / Intact Behavior During Session: [facility] for tasks performed    Mobility  Shoulder Instructions Bed Mobility Bed Mobility: Not assessed Transfers Stand to Sit: 5: Supervision       Exercises      Balance     End of Session OT - End of Session Activity Tolerance: Patient tolerated treatment well Patient left: in chair;with call bell/phone within reach;with family/visitor present;with nursing in room  GO     Evern Bio 06/10/2012, 10:13 AM [phone]

## 2012-06-10 NOTE — Care Management Note (Unsigned)
    Page 1 of 1   06/10/2012     11:00:38 AM   CARE MANAGEMENT NOTE 06/10/2012  Patient:  Karen Watson, Karen Watson   Account Number:  [id number]  Date Initiated:  06/10/2012  Documentation initiated by:  Jacquelynn Cree  Subjective/Objective Assessment:   Admitted postop L2-3, 3-4 XLIF     Action/Plan:   PT/OT evals-no follow up recommended   Anticipated DC Date:  06/10/2012   Anticipated DC Plan:  HOME/SELF CARE      DC Planning Services  CM consult      Choice offered to / List presented to:             Status of service:  Completed, signed off Medicare Important Message given?   (If response is "NO", the following Medicare IM given date fields will be blank) Date Medicare IM given:   Date Additional Medicare IM given:    Discharge Disposition:  HOME/SELF CARE  Per UR Regulation:  Reviewed for med. necessity/level of care/duration of stay  If discussed at Long Length of Stay Meetings, dates discussed:    Comments:

## 2012-06-10 NOTE — Discharge Summary (Signed)
Physician Discharge Summary  Patient ID: Karen Watson MRN: [id number] DOB/AGE: July 22, 1940 71 y.o.  Admit date: 06/08/2012 Discharge date: 06/10/2012  Admission Diagnoses: Lumbar spondylosis with instability and scoliosis    Discharge Diagnoses: Same   Discharged Condition: good  Hospital Course: The patient was admitted on 06/08/2012 and taken to the operating room where the patient underwent an XLIF L2-3 L3-4. The patient tolerated the procedure well and was taken to the recovery room and then to the floor in stable condition. The hospital course was routine. There were no complications. The wound remained clean dry and intact. Pt had appropriate back soreness. No complaints of leg pain or new N/T/W. The patient remained afebrile with stable vital signs, and tolerated a regular diet. The patient continued to increase activities, and pain was well controlled with oral pain medications.   Consults: None  Significant Diagnostic Studies:  Results for orders placed during the hospital encounter of 06/08/12  SURGICAL PCR SCREEN      Component Value Range   MRSA, PCR NEGATIVE  NEGATIVE   Staphylococcus aureus NEGATIVE  NEGATIVE  TYPE AND SCREEN      Component Value Range   ABO/RH(D) A POS     Antibody Screen NEG     Sample Expiration 06/16/2012    CBC WITH DIFFERENTIAL      Component Value Range   WBC 6.0  4.0 - 10.5 K/uL   RBC 3.81 (*) 3.87 - 5.11 MIL/uL   Hemoglobin 11.7 (*) 12.0 - 15.0 g/dL   HCT 46.9  62.9 - 52.8 %   MCV 98.7  78.0 - 100.0 fL   MCH 30.7  26.0 - 34.0 pg   MCHC 31.1  30.0 - 36.0 g/dL   RDW 41.3  24.4 - 01.0 %   Platelets 237  150 - 400 K/uL   Neutrophils Relative 56  43 - 77 %   Neutro Abs 3.3  1.7 - 7.7 K/uL   Lymphocytes Relative 34  12 - 46 %   Lymphs Abs 2.0  0.7 - 4.0 K/uL   Monocytes Relative 8  3 - 12 %   Monocytes Absolute 0.5  0.1 - 1.0 K/uL   Eosinophils Relative 2  0 - 5 %   Eosinophils Absolute 0.1  0.0 - 0.7 K/uL   Basophils Relative 1  0  - 1 %   Basophils Absolute 0.0  0.0 - 0.1 K/uL  BASIC METABOLIC PANEL      Component Value Range   Sodium 144  135 - 145 mEq/L   Potassium 5.0  3.5 - 5.1 mEq/L   Chloride 108  96 - 112 mEq/L   CO2 26  19 - 32 mEq/L   Glucose, Bld 90  70 - 99 mg/dL   BUN 30 (*) 6 - 23 mg/dL   Creatinine, Ser 2.72 (*) 0.50 - 1.10 mg/dL   Calcium 53.6 (*) 8.4 - 10.5 mg/dL   GFR calc non Af Amer 45 (*) >90 mL/min   GFR calc Af Amer 52 (*) >90 mL/min  PROTIME-INR      Component Value Range   Prothrombin Time 12.2  11.6 - 15.2 seconds   INR 0.91  0.00 - 1.49    Dg Chest 2 View  06/02/2012  *RADIOLOGY REPORT*  Clinical Data: Preop for lumbar spine fusion  CHEST - 2 VIEW  Comparison: Chest x-ray of 11/07/2008  Findings: No pneumonia or effusion is seen.  However, at the right lung base there is suspicion of  a poorly defined nodule of approximately 11 mm in diameter. This may have been present on prior chest x-ray but not as prominent and could simply represent a granuloma. 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  Consider CT of the chest to assess further. Mediastinal contours are stable.  The heart is borderline enlarged and stable.  There are surgical clips present in the right upper quadrant from prior cholecystectomy.  Mild degenerative changes noted in the thoracic spine.  IMPRESSION:  1.  No active lung disease. 2.  Question of 11 mm noncalcified nodule at the right lung base. Consider CT of the chest to evaluate further.   Original Report Authenticated By: Dwyane Dee, M.D.    Dg Lumbar Spine 2-3 Views  06/08/2012  *RADIOLOGY REPORT*  Clinical Data: Back pain  LUMBAR SPINE - 2-3 VIEW,DG C-ARM GT 120 MIN  Comparison: Multiple priors.  Findings: C-arm films document L2-3-4 XLIF.  Satisfactory position and alignment.  IMPRESSION: As above.   Original Report Authenticated By: Davonna Belling, M.D.    Ct Chest Wo Contrast  06/07/2012  *RADIOLOGY REPORT*  Clinical Data: Abnormal  chest x-ray. History of right lung cancer.  CT CHEST WITHOUT CONTRAST  Technique:  Multidetector CT imaging of the chest was performed following the standard protocol without IV contrast.  Comparison: 06/02/2012 the  Findings: There is a rounded well circumscribed nodule in the right lower lobe corresponding to the density seen on chest x-ray.  This measures 10 mm.  Postoperative changes are noted in the right lung. Scarring at the bases, right greater than left.  No pleural effusions. There are a few scattered tiny left lower lobe pulmonary nodules (3 mm or less), nonspecific, but possibly post inflammatory.  Otherwise, left lung is clear.  Heart is normal size. Aorta is normal caliber. No mediastinal, hilar, or axillary adenopathy.  Visualized thyroid and chest wall soft tissues unremarkable. Imaging into the upper abdomen shows no acute findings.  Prior cholecystectomy.  No acute or focal bony abnormality.  Postoperative changes in the right ribs.  IMPRESSION: Density on chest x-ray corresponds to a 10 mm rounded loss of circumscribed nodule in the right lower lobe.  While this could reflect a benign process given its appearance, I cannot exclude neoplasm, particularly given the patient's history of prior lung cancer.  Recommend further evaluation with PET CT.  Scattered tiny nonspecific left lower lobe pulmonary nodules, possibly post inflammatory.  Recommend attention on follow-up imaging.   Original Report Authenticated By: Charlett Nose, M.D.    Dg C-arm Gt 120 Min  06/08/2012  *RADIOLOGY REPORT*  Clinical Data: Back pain  LUMBAR SPINE - 2-3 VIEW,DG C-ARM GT 120 MIN  Comparison: Multiple priors.  Findings: C-arm films document L2-3-4 XLIF.  Satisfactory position and alignment.  IMPRESSION: As above.   Original Report Authenticated By: Davonna Belling, M.D.     Antibiotics:  Anti-infectives     Start     Dose/Rate Route Frequency Ordered Stop   06/09/12 0700   vancomycin (VANCOCIN) 1,250 mg in sodium  chloride 0.9 % 250 mL IVPB        1,250 mg 166.7 mL/hr over 90 Minutes Intravenous  Once 06/08/12 1502 06/09/12 0758   06/08/12 0952   bacitracin 50,000 Units in sodium chloride irrigation 0.9 % 500 mL irrigation  Status:  Discontinued          As needed 06/08/12 0952 06/08/12 1202   06/08/12 0829   bacitracin [address] UNITS injection  Comments: KEY, JENNIFER: cabinet override         06/08/12 0829 06/08/12 2029   06/08/12 0600   vancomycin (VANCOCIN) IVPB 1000 mg/200 mL premix        1,000 mg 200 mL/hr over 60 Minutes Intravenous On call to O.R. 06/07/12 1513 06/08/12 0849          Discharge Exam: Blood pressure 110/60, pulse 64, temperature 98.1 F (36.7 C), temperature source Oral, resp. rate 18, height 5\' 5"  (1.651 m), weight 86.8 kg (191 lb 5.8 oz), SpO2 98.00%. Neurologic: Grossly normal Incision is okay  Discharge Medications:     Medication List     As of 06/10/2012  8:00 AM    STOP taking these medications         meloxicam 15 MG tablet   Commonly known as: MOBIC      TAKE these medications         acetaminophen 500 MG tablet   Commonly known as: TYLENOL   Take 1,000 mg by mouth 3 (three) times daily as needed. For pain      b complex vitamins tablet   Take 1 tablet by mouth daily.      calcium-vitamin D 250-125 MG-UNIT per tablet   Commonly known as: OSCAL WITH D   Take 1 tablet by mouth daily.      fish oil-omega-3 fatty acids 1000 MG capsule   Take 1 g by mouth daily.      gabapentin 300 MG capsule   Commonly known as: NEURONTIN   Take 1 capsule (300 mg total) by mouth 3 (three) times daily.      lisinopril 20 MG tablet   Commonly known as: PRINIVIL,ZESTRIL   Take 1 tablet (20 mg total) by mouth daily.      MAGNESIUM-ZINC PO   Take 1 tablet by mouth daily.      metaxalone 800 MG tablet   Commonly known as: SKELAXIN   Take 800 mg by mouth 3 (three) times daily.      MULTIPLE VITAMIN PO   Take 1 tablet by mouth daily.      potassium  chloride SA 20 MEQ tablet   Commonly known as: K-DUR,KLOR-CON   Take 1 tablet (20 mEq total) by mouth daily.      triamterene-hydrochlorothiazide 37.5-25 MG per tablet   Commonly known as: MAXZIDE-25   Take 1 each (1 tablet total) by mouth daily.      vitamin C 500 MG tablet   Commonly known as: ASCORBIC ACID   Take 500 mg by mouth daily.      vitamin E 400 UNIT capsule   Take 400 Units by mouth daily.        Disposition: Home  Final Dx: XLIF      Discharge Orders    Future Orders Please Complete By Expires   Diet - low sodium heart healthy      Increase activity slowly      Discharge instructions      Comments:   No bending twisting or heavy lifting. Up and around as tolerated   Remove dressing in 48 hours      Call MD for:  temperature >100.4      Call MD for:  persistant nausea and vomiting      Call MD for:  severe uncontrolled pain      Call MD for:  redness, tenderness, or signs of infection (pain, swelling, redness, odor or green/yellow discharge around incision site)  Call MD for:  difficulty breathing, headache or visual disturbances         Follow-up Information    Follow up with JONES,DAVID S, MD. In 2 weeks.   Contact information:   [address] [place] [address] [phone]           Signed: Tia Alert 06/10/2012, 8:00 AM

## 2012-06-10 NOTE — Progress Notes (Signed)
Physical Therapy Treatment Patient Details Name: LEVONNE CARRERAS MRN: [id number] DOB: [birth year] Today's Date: 06/10/2012 Time: 8295-6213 PT Time Calculation (min): 10 min  PT Assessment / Plan / Recommendation Comments on Treatment Session  Pt at supervision/ modified independent levels.  Patient demonstrates safe functional mobility. Feel patient will be safe for d/c home. Educated on technique for car transfer to maintain precautions.     Follow Up Recommendations  No PT follow up                    Plan Discharge plan remains appropriate    Precautions / Restrictions Precautions Precautions: Back Precaution Comments: reviewed back precautions Required Braces or Orthoses: Spinal Brace Spinal Brace: Lumbar corset;Applied in sitting position Restrictions Weight Bearing Restrictions: No       Mobility  Bed Mobility Bed Mobility: Not assessed Transfers Transfers: Sit to Stand;Stand to Sit Sit to Stand: 5: Supervision Stand to Sit: 5: Supervision      Visit Information  Last PT Received On: 06/10/12 Assistance Needed: +1    Subjective Data  Subjective: I am ready to go home Patient Stated Goal: to go home   Cognition  Overall Cognitive Status: Appears within functional limits for tasks assessed/performed Arousal/Alertness: Awake/alert Orientation Level: Appears intact for tasks assessed;Oriented X4 / Intact Behavior During Session: [facility] for tasks performed       End of Session PT - End of Session Activity Tolerance: Patient tolerated treatment well Patient left: in chair;with call bell/phone within reach;with family/visitor present Nurse Communication: Mobility status   GP     Fabio Asa 06/10/2012, 10:50 AM Charlotte Crumb, PT DPT  [phone]

## 2012-06-27 ENCOUNTER — Encounter: Payer: Medicare Other | Admitting: Internal Medicine

## 2013-01-20 ENCOUNTER — Other Ambulatory Visit: Payer: Self-pay | Admitting: Dermatology

## 2013-04-15 IMAGING — CT CT CHEST W/O CM
2 of 4 series · 15 of 36 positions shown, 18 images · non-contrast
Comparison: 06/02/2012 the

CLINICAL DATA: Abnormal chest x-ray. History of right lung cancer.

CT CHEST WITHOUT CONTRAST
TECHNIQUE: Multidetector CT imaging of the chest was performed
following the standard protocol without IV contrast.

[Series 2: chest w/o · axial · non-contrast · 0.70mm/px · z∈[-244,+10]mm · 12 of 61 slices shown, 15 images]
[im 5/61  mediastinal]
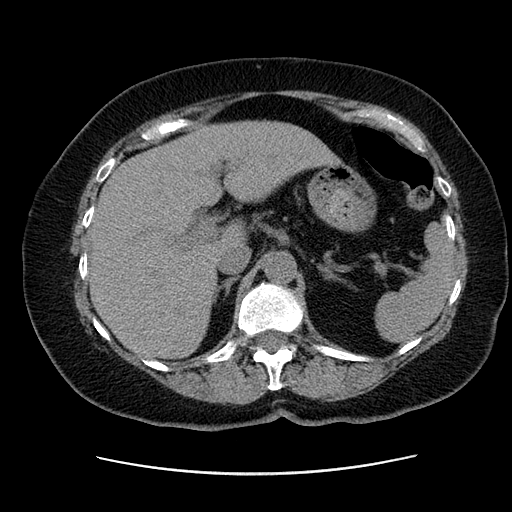
[im 5/61  lung]
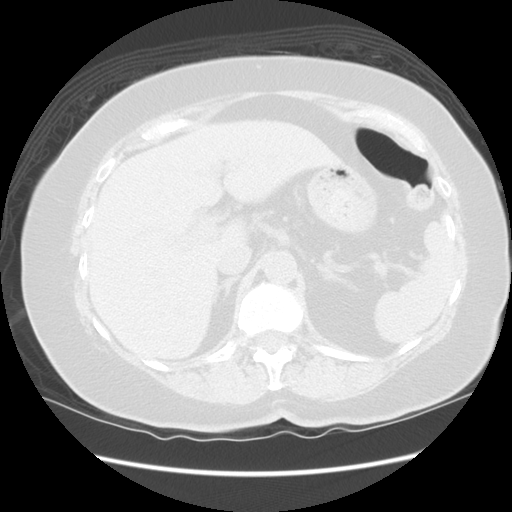
[im 10/61  lung]
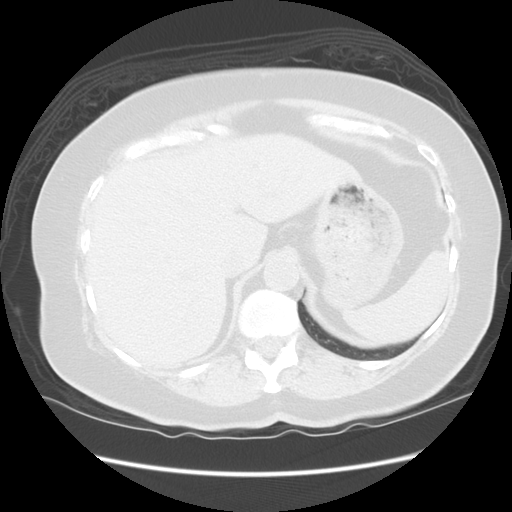
[im 14/61  lung]
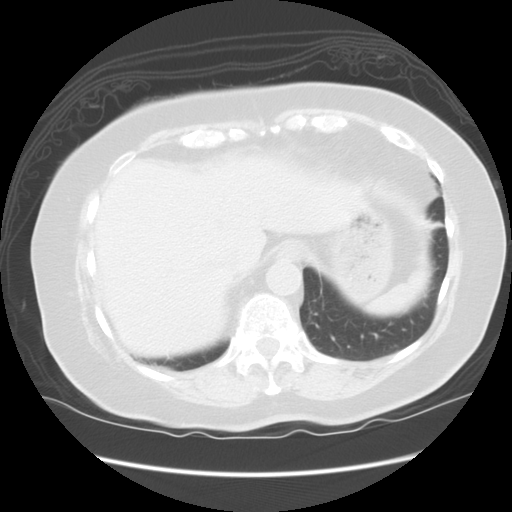
[im 19/61  lung]
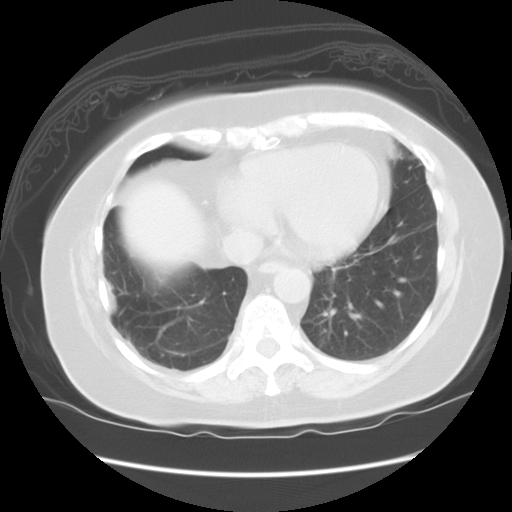
[im 24/61  mediastinal]
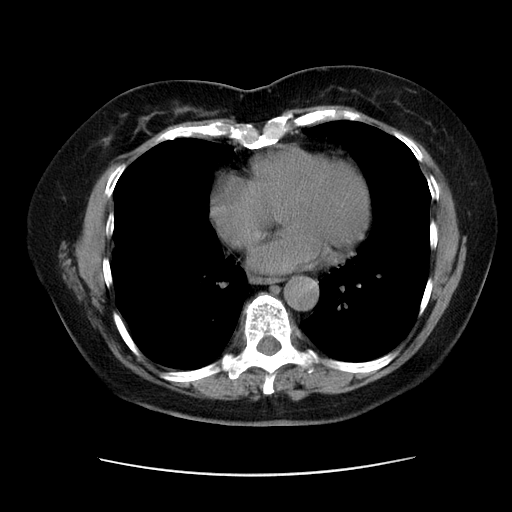
[im 24/61  lung]
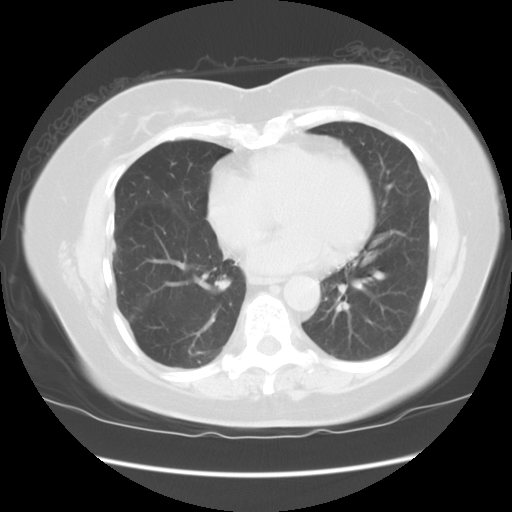
[im 28/61  lung]
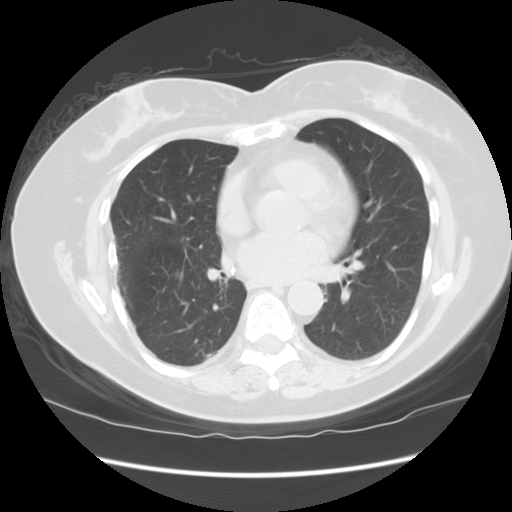
[im 33/61  lung]
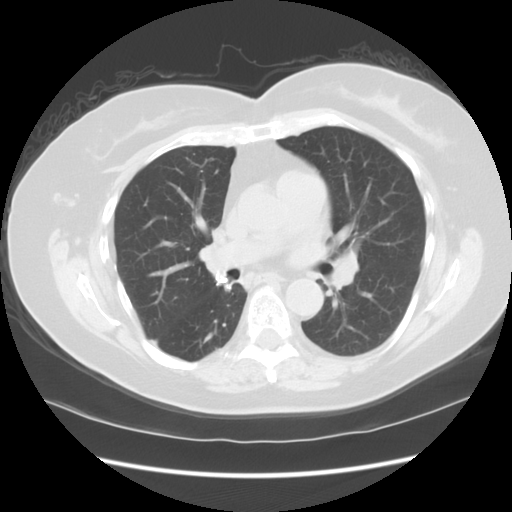
[im 37/61  lung]
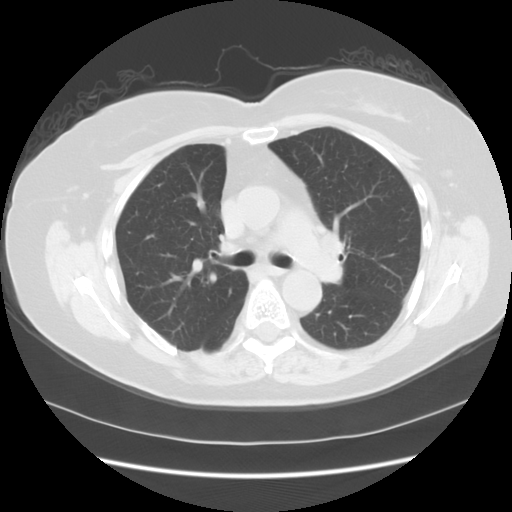
[im 42/61  mediastinal]
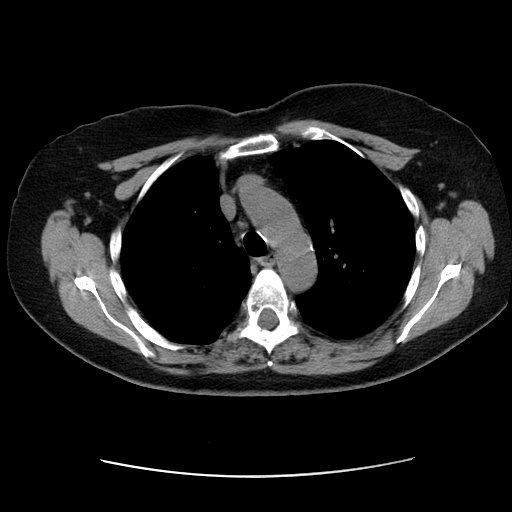
[im 42/61  lung]
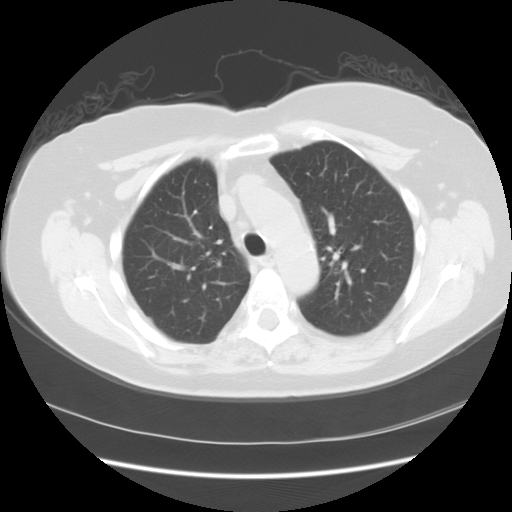
[im 47/61  lung]
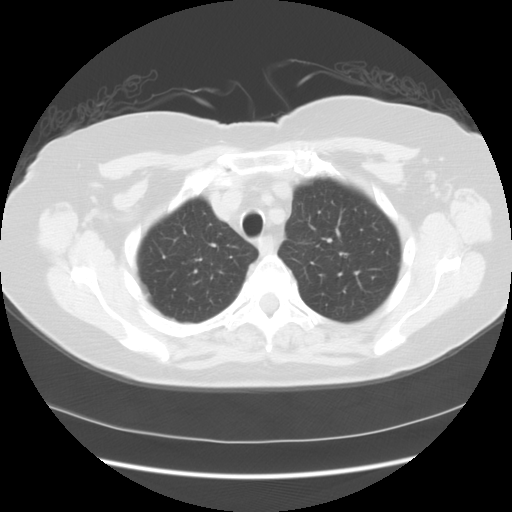
[im 51/61  lung]
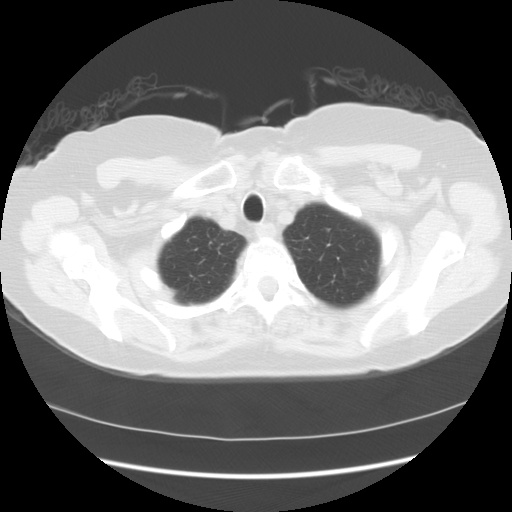
[im 56/61  lung]
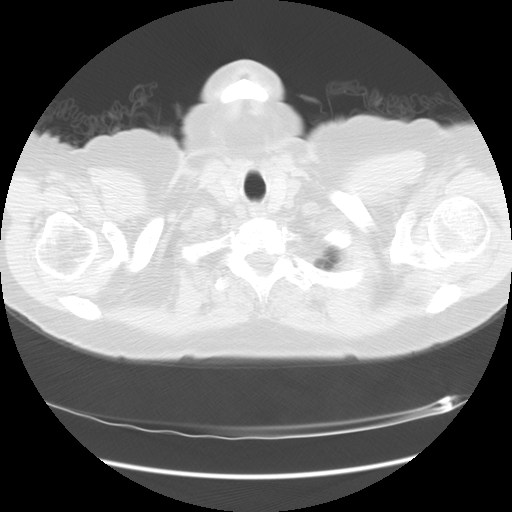

[Series 400: cor · coronal · 0.70mm/px · 3 of 104 slices shown]
[im 21/104  lung]
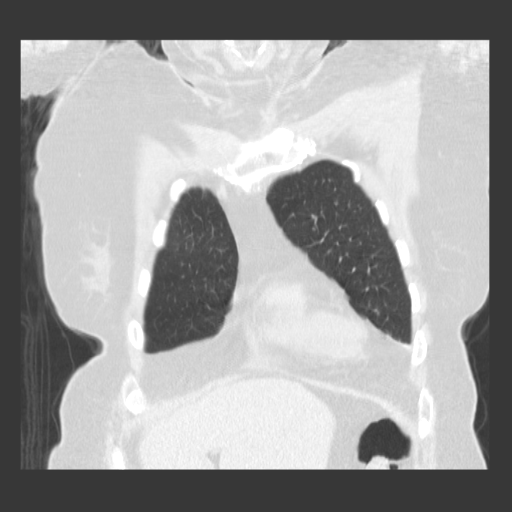
[im 42/104  lung]
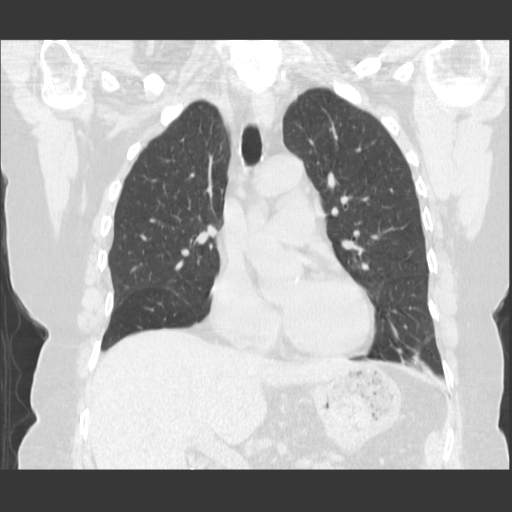
[im 62/104  lung]
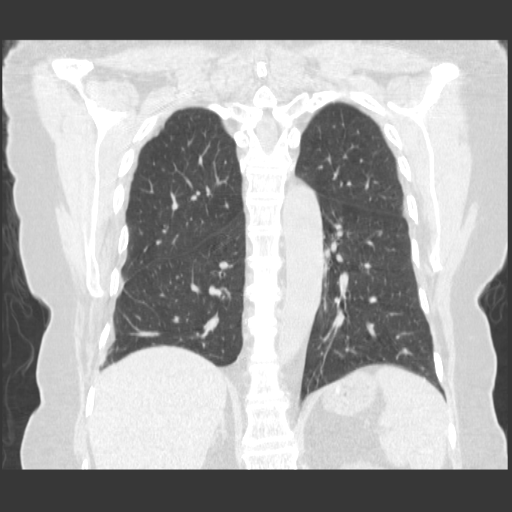

[15 of 36 positions shown; findings below may reference images not displayed]

FINDINGS: There is a rounded well circumscribed nodule in the right
lower lobe corresponding to the density seen on chest x-ray.  This
measures 10 mm.  Postoperative changes are noted in the right lung.
Scarring at the bases, right greater than left.  No pleural
effusions. There are a few scattered tiny left lower lobe pulmonary
nodules (3 mm or less), nonspecific, but possibly post
inflammatory.  Otherwise, left lung is clear.

Heart is normal size. Aorta is normal caliber. No mediastinal,
hilar, or axillary adenopathy.  Visualized thyroid and chest wall
soft tissues unremarkable. Imaging into the upper abdomen shows no
acute findings.  Prior cholecystectomy.

No acute or focal bony abnormality.  Postoperative changes in the
right ribs.
IMPRESSION: Density on chest x-ray corresponds to a 10 mm rounded loss of
circumscribed nodule in the right lower lobe.  While this could
reflect a benign process given its appearance, I cannot exclude
neoplasm, particularly given the patient's history of prior lung
cancer.  Recommend further evaluation with PET CT.

Scattered tiny nonspecific left lower lobe pulmonary nodules,
possibly post inflammatory.  Recommend attention on follow-up
imaging.

## 2013-05-11 ENCOUNTER — Ambulatory Visit (INDEPENDENT_AMBULATORY_CARE_PROVIDER_SITE_OTHER): Payer: Medicare Other | Admitting: Family Medicine

## 2013-05-11 ENCOUNTER — Encounter: Payer: Self-pay | Admitting: Family Medicine

## 2013-05-11 VITALS — BP 110/78 | HR 68 | Temp 97.9°F | Ht 64.5 in | Wt 150.0 lb

## 2013-05-11 DIAGNOSIS — Z9181 History of falling: Secondary | ICD-10-CM

## 2013-05-11 DIAGNOSIS — R296 Repeated falls: Secondary | ICD-10-CM

## 2013-05-11 DIAGNOSIS — Z0000 Encounter for general adult medical examination without abnormal findings: Secondary | ICD-10-CM

## 2013-05-11 DIAGNOSIS — Z23 Encounter for immunization: Secondary | ICD-10-CM

## 2013-05-11 DIAGNOSIS — I10 Essential (primary) hypertension: Secondary | ICD-10-CM

## 2013-05-11 LAB — POCT URINALYSIS DIPSTICK
Blood, UA: NEGATIVE
Nitrite, UA: NEGATIVE
Protein, UA: NEGATIVE
Urobilinogen, UA: 0.2
pH, UA: 7.5

## 2013-05-11 LAB — BASIC METABOLIC PANEL
CO2: 30 mEq/L (ref 19–32)
Calcium: 10.1 mg/dL (ref 8.4–10.5)
Glucose, Bld: 77 mg/dL (ref 70–99)
Potassium: 4.2 mEq/L (ref 3.5–5.1)
Sodium: 139 mEq/L (ref 135–145)

## 2013-05-11 LAB — HEPATIC FUNCTION PANEL
ALT: 16 U/L (ref 0–35)
AST: 22 U/L (ref 0–37)
Albumin: 4.3 g/dL (ref 3.5–5.2)
Alkaline Phosphatase: 56 U/L (ref 39–117)
Total Protein: 7.2 g/dL (ref 6.0–8.3)

## 2013-05-11 LAB — CBC WITH DIFFERENTIAL/PLATELET
Eosinophils Absolute: 0.1 10*3/uL (ref 0.0–0.7)
Lymphs Abs: 1.6 10*3/uL (ref 0.7–4.0)
MCHC: 33.7 g/dL (ref 30.0–36.0)
MCV: 93.9 fl (ref 78.0–100.0)
Monocytes Absolute: 0.3 10*3/uL (ref 0.1–1.0)
Neutrophils Relative %: 64.2 % (ref 43.0–77.0)
Platelets: 204 10*3/uL (ref 150.0–400.0)

## 2013-05-11 LAB — LIPID PANEL: HDL: 43.4 mg/dL (ref >39.00)

## 2013-05-11 MED ORDER — GABAPENTIN 300 MG PO CAPS: 300.0000 mg | ORAL_CAPSULE | Freq: Three times a day (TID) | ORAL | Status: DC

## 2013-05-11 MED ORDER — LISINOPRIL 10 MG PO TABS: 10.0000 mg | ORAL_TABLET | Freq: Every day | ORAL | Status: DC

## 2013-05-11 MED ORDER — TRIAMTERENE-HCTZ 37.5-25 MG PO TABS: 1.0000 | ORAL_TABLET | Freq: Every day | ORAL | Status: DC

## 2013-05-11 MED ORDER — POTASSIUM CHLORIDE CRYS ER 20 MEQ PO TBCR: 20.0000 meq | EXTENDED_RELEASE_TABLET | Freq: Every day | ORAL | Status: DC

## 2013-05-11 NOTE — Progress Notes (Signed)
Pre visit review using our clinic review tool, if applicable. No additional management support is needed unless otherwise documented below in the visit note. 

## 2013-05-11 NOTE — Progress Notes (Signed)
  Subjective:    Patient ID: Karen Watson, female    DOB: 06/05/1941, 72 y.o.   MRN: [id number]  HPI 72 yr old female for a cpx. She has a few concerns. She has not had a bone density evaluation for 4 years. Also she has fallen 6 times in the past month and she is worried that something is wrong. She has avoided any major injuries but she is worried that she could do so. The last 3 times she fell was because her foot struck something on the ground that caused her to trip, including a tree root and a ramp and an electrical box. She thinks she should have been able to catch herself without falling during these episodes. She denies any lightheadedness or dizziness, no weakness. She is active and she walks with her husband for 3 miles about 5 days a week. She is past due for a colonoscopy but she is scheduled to have this with Dr. Rhea Belton on 07-20-13.   Review of Systems  Constitutional: Negative.   HENT: Negative.   Eyes: Negative.   Respiratory: Negative.   Cardiovascular: Negative.   Gastrointestinal: Negative.   Genitourinary: Negative for dysuria, urgency, frequency, hematuria, flank pain, decreased urine volume, enuresis, difficulty urinating, pelvic pain and dyspareunia.  Musculoskeletal: Negative.   Skin: Negative.   Neurological: Negative.   Psychiatric/Behavioral: Negative.        Objective:   Physical Exam  Constitutional: She is oriented to person, place, and time. She appears well-developed and well-nourished. No distress.  HENT:  Head: Normocephalic and atraumatic.  Right Ear: External ear normal.  Left Ear: External ear normal.  Nose: Nose normal.  Mouth/Throat: Oropharynx is clear and moist. No oropharyngeal exudate.  Eyes: Conjunctivae and EOM are normal. Pupils are equal, round, and reactive to light. No scleral icterus.  Neck: Normal range of motion. Neck supple. No JVD present. No thyromegaly present.  Cardiovascular: Normal rate, regular rhythm, normal heart sounds  and intact distal pulses.  Exam reveals no gallop and no friction rub.   No murmur heard. EKG normal   Pulmonary/Chest: Effort normal and breath sounds normal. No respiratory distress. She has no wheezes. She has no rales. She exhibits no tenderness.  Abdominal: Soft. Bowel sounds are normal. She exhibits no distension and no mass. There is no tenderness. There is no rebound and no guarding.  Musculoskeletal: Normal range of motion. She exhibits no edema and no tenderness.  Lymphadenopathy:    She has no cervical adenopathy.  Neurological: She is alert and oriented to person, place, and time. She has normal reflexes. No cranial nerve deficit. She exhibits normal muscle tone. Coordination normal.  Skin: Skin is warm and dry. No rash noted. No erythema.  Psychiatric: She has a normal mood and affect. Her behavior is normal. Judgment and thought content normal.          Assessment & Plan:  Well exam. Get fasting labs. Set up a DEXA. Refer to PT for an assessment of fall risk.

## 2013-05-24 ENCOUNTER — Ambulatory Visit: Payer: Medicare Other | Attending: Family Medicine

## 2013-05-24 DIAGNOSIS — R262 Difficulty in walking, not elsewhere classified: Secondary | ICD-10-CM | POA: Insufficient documentation

## 2013-05-24 DIAGNOSIS — M6281 Muscle weakness (generalized): Secondary | ICD-10-CM | POA: Insufficient documentation

## 2013-05-24 DIAGNOSIS — IMO0001 Reserved for inherently not codable concepts without codable children: Secondary | ICD-10-CM | POA: Insufficient documentation

## 2013-05-24 DIAGNOSIS — R269 Unspecified abnormalities of gait and mobility: Secondary | ICD-10-CM | POA: Insufficient documentation

## 2013-05-29 ENCOUNTER — Other Ambulatory Visit: Payer: Self-pay | Admitting: Family Medicine

## 2013-05-29 NOTE — Telephone Encounter (Signed)
Should pt be on this medication and if so can I refill?

## 2013-05-30 NOTE — Telephone Encounter (Signed)
Okay for one year  

## 2013-06-01 ENCOUNTER — Ambulatory Visit: Payer: Medicare Other | Admitting: Physical Therapy

## 2013-06-05 ENCOUNTER — Ambulatory Visit (INDEPENDENT_AMBULATORY_CARE_PROVIDER_SITE_OTHER)
Admission: RE | Admit: 2013-06-05 | Discharge: 2013-06-05 | Disposition: A | Discharge status: Home / Self Care | Payer: Medicare Other | Source: Ambulatory Visit | Attending: Family Medicine | Admitting: Family Medicine

## 2013-06-05 DIAGNOSIS — Z13820 Encounter for screening for osteoporosis: Secondary | ICD-10-CM

## 2013-06-05 DIAGNOSIS — Z0000 Encounter for general adult medical examination without abnormal findings: Secondary | ICD-10-CM

## 2013-06-27 ENCOUNTER — Encounter: Payer: Self-pay | Admitting: Family Medicine

## 2013-07-05 ENCOUNTER — Telehealth: Payer: Self-pay | Admitting: Family Medicine

## 2013-07-05 NOTE — Telephone Encounter (Signed)
Pt wants to know based on the last mammogram 06/01/13, does she need to do anything further? If not then when should she repeat test?

## 2013-07-06 ENCOUNTER — Encounter: Payer: Self-pay | Admitting: Internal Medicine

## 2013-07-06 ENCOUNTER — Ambulatory Visit (AMBULATORY_SURGERY_CENTER): Payer: Self-pay | Admitting: *Deleted

## 2013-07-06 ENCOUNTER — Telehealth: Payer: Self-pay | Admitting: *Deleted

## 2013-07-06 VITALS — Ht 65.0 in | Wt 155.4 lb

## 2013-07-06 DIAGNOSIS — Z1211 Encounter for screening for malignant neoplasm of colon: Secondary | ICD-10-CM

## 2013-07-06 MED ORDER — MOVIPREP 100 G PO SOLR: ORAL | Status: DC

## 2013-07-06 NOTE — Telephone Encounter (Signed)
Karen Watson: pt is scheduled for direct colonoscopy with Dr. Hilarie Fredrickson 07/20/13.  Pt says her daughter has hx of "not waking up for a week after anesthesia"  She says she was on ventilator in ICU.  Pt has had quite a few surgeries with some problems after anesthesia.  Would you call and talk to her?  Is she okay for [facility]?  Phone number to reach pt: [phone].  Thanks, Juliann Pulse in [place].

## 2013-07-06 NOTE — Telephone Encounter (Signed)
I left a voice message with below information, please follow up with pt.

## 2013-07-06 NOTE — Telephone Encounter (Signed)
The report I have from 06-01-13 was the regular mammogram which showed calcifications in the left breast. They recommended repeat mammogram of the left breast with magnification views but I never saw this report. Did she have this done?

## 2013-07-06 NOTE — Progress Notes (Signed)
No allergies to eggs or soy. Problems with anesthesia: nausea and vomiting

## 2013-07-07 NOTE — Telephone Encounter (Signed)
Juliann Pulse,  I have reviewed this pt's chart and she is cleared for [place]  I  Also spoke with this very nice lady, answered her questions concerning the anesthesia, and she is prepared for MAC at [facility].  Best regards,  Jenny Reichmann

## 2013-07-07 NOTE — Telephone Encounter (Signed)
I spoke with pt and she did not have a repeat done, she will call and get that scheduled.

## 2013-07-20 ENCOUNTER — Telehealth: Payer: Self-pay | Admitting: Internal Medicine

## 2013-07-20 ENCOUNTER — Encounter: Payer: Medicare Other | Admitting: Internal Medicine

## 2013-07-20 NOTE — Telephone Encounter (Signed)
no

## 2013-07-23 HISTORY — PX: BREAST BIOPSY: SHX20

## 2013-08-03 ENCOUNTER — Encounter: Payer: Self-pay | Admitting: Internal Medicine

## 2013-08-03 ENCOUNTER — Ambulatory Visit (AMBULATORY_SURGERY_CENTER): Payer: Medicare Other | Admitting: Internal Medicine

## 2013-08-03 VITALS — BP 128/57 | HR 59 | Temp 98.8°F | Resp 14 | Ht 65.0 in | Wt 155.0 lb

## 2013-08-03 DIAGNOSIS — Z1211 Encounter for screening for malignant neoplasm of colon: Secondary | ICD-10-CM

## 2013-08-03 HISTORY — PX: OTHER SURGICAL HISTORY: SHX169

## 2013-08-03 MED ORDER — SODIUM CHLORIDE 0.9 % IV SOLN: 500.0000 mL | INTRAVENOUS | Status: DC

## 2013-08-03 NOTE — Op Note (Signed)
[place]  [employer]. [place], [address]   COLONOSCOPY PROCEDURE REPORT  PATIENT: Karen Watson, Karen Watson  MR#: [id number] BIRTHDATE: [birth year] , 72  yrs. old GENDER: Female ENDOSCOPIST: Jerene Bears, MD REFERRED ID:[id number] Sarajane Jews, M.D. PROCEDURE DATE:  08/03/2013 PROCEDURE:   Colonoscopy, screening First Screening Colonoscopy - Avg.  risk and is 50 yrs.  old or older - No.  Prior Negative Screening - Now for repeat screening. 10 or more years since last screening  History of Adenoma - Now for follow-up colonoscopy & has been > or = to 3 yrs.  N/A  Polyps Removed Today? No.  Recommend repeat exam, <10 yrs? No. ASA CLASS:   Class III INDICATIONS:average risk screening and Last colonoscopy performed 11 years ago. MEDICATIONS: MAC sedation, administered by CRNA and propofol (Diprivan) 250mg  IV  DESCRIPTION OF PROCEDURE:   After the risks benefits and alternatives of the procedure were thoroughly explained, informed consent was obtained.  A digital rectal exam revealed no rectal mass.   The LB PFC-H190 [id number]  endoscope was introduced through the anus and advanced to the cecum, which was identified by both the appendix and ileocecal valve. No adverse events experienced. The quality of the prep was good, using MoviPrep  The instrument was then slowly withdrawn as the colon was fully examined.       COLON FINDINGS: A normal appearing cecum, ileocecal valve, and appendiceal orifice were identified.  The ascending, hepatic flexure, transverse, splenic flexure, descending, sigmoid colon and rectum appeared unremarkable.  No polyps or cancers were seen. Retroflexed views revealed small internal hemorrhoids. The time to cecum=6 minutes 20 seconds.  Withdrawal time=13 minutes 03 seconds. The scope was withdrawn and the procedure completed.  COMPLICATIONS: There were no complications.  ENDOSCOPIC IMPRESSION: Normal colon  RECOMMENDATIONS: Given your age, you may  not need another colonoscopy for colon cancer screening or polyp surveillance.  These types of tests usually stop around the age 27.  The decision to repeat colonoscopy for colon cancer screening can be addressed at that time with your primary care doctor.   eSigned:  Jerene Bears, MD 08/03/2013 9:36 AM   cc: The Patient and Laurey Morale, MD

## 2013-08-03 NOTE — Progress Notes (Signed)
A/ox3 pleased with MAC, report to Celia RN 

## 2013-08-03 NOTE — Patient Instructions (Signed)
Discharge instructions given with verbal understanding. Normal exam. Resume previous medications. YOU HAD AN ENDOSCOPIC PROCEDURE TODAY AT THE [facility]: Refer to the procedure report that was given to you for any specific questions about what was found during the examination.  If the procedure report does not answer your questions, please call your gastroenterologist to clarify.  If you requested that your care partner not be given the details of your procedure findings, then the procedure report has been included in a sealed envelope for you to review at your convenience later.  YOU SHOULD EXPECT: Some feelings of bloating in the abdomen. Passage of more gas than usual.  Walking can help get rid of the air that was put into your GI tract during the procedure and reduce the bloating. If you had a lower endoscopy (such as a colonoscopy or flexible sigmoidoscopy) you may notice spotting of blood in your stool or on the toilet paper. If you underwent a bowel prep for your procedure, then you may not have a normal bowel movement for a few days.  DIET: Your first meal following the procedure should be a light meal and then it is ok to progress to your normal diet.  A half-sandwich or bowl of soup is an example of a good first meal.  Heavy or fried foods are harder to digest and may make you feel nauseous or bloated.  Likewise meals heavy in dairy and vegetables can cause extra gas to form and this can also increase the bloating.  Drink plenty of fluids but you should avoid alcoholic beverages for 24 hours.  ACTIVITY: Your care partner should take you home directly after the procedure.  You should plan to take it easy, moving slowly for the rest of the day.  You can resume normal activity the day after the procedure however you should NOT DRIVE or use heavy machinery for 24 hours (because of the sedation medicines used during the test).    SYMPTOMS TO REPORT IMMEDIATELY: A gastroenterologist  can be reached at any hour.  During normal business hours, 8:30 AM to 5:00 PM Monday through Friday, call [phone].  After hours and on weekends, please call the GI answering service at [phone] who will take a message and have the physician on call contact you.   Following lower endoscopy (colonoscopy or flexible sigmoidoscopy):  Excessive amounts of blood in the stool  Significant tenderness or worsening of abdominal pains  Swelling of the abdomen that is new, acute  Fever of 100F or higher  FOLLOW UP: If any biopsies were taken you will be contacted by phone or by letter within the next 1-3 weeks.  Call your gastroenterologist if you have not heard about the biopsies in 3 weeks.  Our staff will call the home number listed on your records the next business day following your procedure to check on you and address any questions or concerns that you may have at that time regarding the information given to you following your procedure. This is a courtesy call and so if there is no answer at the home number and we have not heard from you through the emergency physician on call, we will assume that you have returned to your regular daily activities without incident.  SIGNATURES/CONFIDENTIALITY: You and/or your care partner have signed paperwork which will be entered into your electronic medical record.  These signatures attest to the fact that that the information above on your After Visit Summary has been reviewed   and is understood.  Full responsibility of the confidentiality of this discharge information lies with you and/or your care-partner. 

## 2013-08-04 ENCOUNTER — Telehealth: Payer: Self-pay | Admitting: *Deleted

## 2013-08-04 NOTE — Telephone Encounter (Signed)
  Follow up Call-  Call back number 08/03/2013  Post procedure Call Back phone  # [phone]  Permission to leave phone message Yes     Patient questions:  Do you have a fever, pain , or abdominal swelling? no Pain Score  0 *  Have you tolerated food without any problems? yes  Have you been able to return to your normal activities? yes  Do you have any questions about your discharge instructions: Diet   no Medications  no Follow up visit  no  Do you have questions or concerns about your Care? no  Actions: * If pain score is 4 or above: No action needed, pain <4.

## 2013-08-14 ENCOUNTER — Encounter: Payer: Self-pay | Admitting: Family Medicine

## 2013-09-22 NOTE — Telephone Encounter (Signed)
PT NOT BILLED COLON CX FEE/YF

## 2014-03-05 ENCOUNTER — Ambulatory Visit (INDEPENDENT_AMBULATORY_CARE_PROVIDER_SITE_OTHER): Payer: Medicare Other | Admitting: Family Medicine

## 2014-03-05 DIAGNOSIS — Z23 Encounter for immunization: Secondary | ICD-10-CM

## 2014-05-11 ENCOUNTER — Other Ambulatory Visit: Payer: Self-pay | Admitting: Family Medicine

## 2014-05-18 ENCOUNTER — Encounter: Payer: Medicare Other | Admitting: Family Medicine

## 2014-05-25 ENCOUNTER — Ambulatory Visit (INDEPENDENT_AMBULATORY_CARE_PROVIDER_SITE_OTHER): Payer: Medicare Other | Admitting: Family Medicine

## 2014-05-25 ENCOUNTER — Encounter: Payer: Self-pay | Admitting: Family Medicine

## 2014-05-25 VITALS — BP 124/78 | Temp 98.0°F | Ht 66.25 in | Wt 167.0 lb

## 2014-05-25 DIAGNOSIS — Z23 Encounter for immunization: Secondary | ICD-10-CM

## 2014-05-25 DIAGNOSIS — I10 Essential (primary) hypertension: Secondary | ICD-10-CM

## 2014-05-25 DIAGNOSIS — Z0000 Encounter for general adult medical examination without abnormal findings: Secondary | ICD-10-CM

## 2014-05-25 LAB — POCT URINALYSIS DIPSTICK
Bilirubin, UA: NEGATIVE
Blood, UA: NEGATIVE
GLUCOSE UA: NEGATIVE
Ketones, UA: NEGATIVE
LEUKOCYTES UA: NEGATIVE
NITRITE UA: NEGATIVE
Protein, UA: NEGATIVE
Spec Grav, UA: 1.015
UROBILINOGEN UA: 0.2
pH, UA: 7

## 2014-05-25 MED ORDER — MELOXICAM 15 MG PO TABS: ORAL_TABLET | ORAL | Status: DC

## 2014-05-25 MED ORDER — POTASSIUM CHLORIDE CRYS ER 20 MEQ PO TBCR: 20.0000 meq | EXTENDED_RELEASE_TABLET | Freq: Every day | ORAL | Status: DC

## 2014-05-25 MED ORDER — LISINOPRIL 10 MG PO TABS: 10.0000 mg | ORAL_TABLET | Freq: Every day | ORAL | Status: DC

## 2014-05-25 MED ORDER — GABAPENTIN 300 MG PO CAPS: 300.0000 mg | ORAL_CAPSULE | Freq: Three times a day (TID) | ORAL | Status: DC

## 2014-05-25 MED ORDER — TRIAMTERENE-HCTZ 37.5-25 MG PO TABS: 1.0000 | ORAL_TABLET | Freq: Every day | ORAL | Status: DC

## 2014-05-25 NOTE — Progress Notes (Signed)
Pre visit review using our clinic review tool, if applicable. No additional management support is needed unless otherwise documented below in the visit note. 

## 2014-05-25 NOTE — Progress Notes (Signed)
   Subjective:    Patient ID: Karen Watson, female    DOB: 07-01-1940, 73 y.o.   MRN: [id number]  HPI 73 yr old female for a cpx. She feels well.    Review of Systems  Constitutional: Negative.   HENT: Negative.   Eyes: Negative.   Respiratory: Negative.   Cardiovascular: Negative.   Gastrointestinal: Negative.   Genitourinary: Negative for dysuria, urgency, frequency, hematuria, flank pain, decreased urine volume, enuresis, difficulty urinating, pelvic pain and dyspareunia.  Musculoskeletal: Negative.   Skin: Negative.   Neurological: Negative.   Psychiatric/Behavioral: Negative.        Objective:   Physical Exam  Constitutional: She is oriented to person, place, and time. She appears well-developed and well-nourished. No distress.  HENT:  Head: Normocephalic and atraumatic.  Right Ear: External ear normal.  Left Ear: External ear normal.  Nose: Nose normal.  Mouth/Throat: Oropharynx is clear and moist. No oropharyngeal exudate.  Eyes: Conjunctivae and EOM are normal. Pupils are equal, round, and reactive to light. No scleral icterus.  Neck: Normal range of motion. Neck supple. No JVD present. No thyromegaly present.  Cardiovascular: Normal rate, regular rhythm, normal heart sounds and intact distal pulses.  Exam reveals no gallop and no friction rub.   No murmur heard. Pulmonary/Chest: Effort normal and breath sounds normal. No respiratory distress. She has no wheezes. She has no rales. She exhibits no tenderness.  Abdominal: Soft. Bowel sounds are normal. She exhibits no distension and no mass. There is no tenderness. There is no rebound and no guarding.  Musculoskeletal: Normal range of motion. She exhibits no edema or tenderness.  Lymphadenopathy:    She has no cervical adenopathy.  Neurological: She is alert and oriented to person, place, and time. She has normal reflexes. No cranial nerve deficit. She exhibits normal muscle tone. Coordination normal.  Skin: Skin is  warm and dry. No rash noted. No erythema.  Psychiatric: She has a normal mood and affect. Her behavior is normal. Judgment and thought content normal.          Assessment & Plan:  Well exam. Get fasting labs.

## 2014-05-26 LAB — TSH: TSH: 1.05 u[IU]/mL (ref 0.35–4.50)

## 2014-05-27 LAB — CBC WITH DIFFERENTIAL/PLATELET
Basophils Absolute: 0 10*3/uL (ref 0.0–0.1)
Basophils Relative: 0.6 % (ref 0.0–3.0)
EOS PCT: 3.6 % (ref 0.0–5.0)
Eosinophils Absolute: 0.2 10*3/uL (ref 0.0–0.7)
HEMATOCRIT: 41.1 % (ref 36.0–46.0)
Hemoglobin: 13.1 g/dL (ref 12.0–15.0)
LYMPHS PCT: 28 % (ref 12.0–46.0)
Lymphs Abs: 1.5 10*3/uL (ref 0.7–4.0)
MCHC: 31.9 g/dL (ref 30.0–36.0)
MCV: 96.1 fl (ref 78.0–100.0)
MONOS PCT: 4 % (ref 3.0–12.0)
Monocytes Absolute: 0.2 10*3/uL (ref 0.1–1.0)
NEUTROS PCT: 63.8 % (ref 43.0–77.0)
Neutro Abs: 3.4 10*3/uL (ref 1.4–7.7)
PLATELETS: 227 10*3/uL (ref 150.0–400.0)
RBC: 4.28 Mil/uL (ref 3.87–5.11)
RDW: 15 % (ref 11.5–15.5)
WBC: 5.3 10*3/uL (ref 4.0–10.5)

## 2014-05-27 LAB — BASIC METABOLIC PANEL
BUN: 28 mg/dL — ABNORMAL HIGH (ref 6–23)
CALCIUM: 10 mg/dL (ref 8.4–10.5)
CO2: 28 meq/L (ref 19–32)
CREATININE: 1.2 mg/dL (ref 0.4–1.2)
Chloride: 103 mEq/L (ref 96–112)
GFR: 45.15 mL/min — ABNORMAL LOW (ref >60.00)
Glucose, Bld: 92 mg/dL (ref 70–99)
Potassium: 4.4 mEq/L (ref 3.5–5.1)
Sodium: 139 mEq/L (ref 135–145)

## 2014-05-27 LAB — LIPID PANEL
Cholesterol: 185 mg/dL (ref 0–200)
HDL: 38.6 mg/dL — AB (ref >39.00)
LDL Cholesterol: 124 mg/dL — ABNORMAL HIGH (ref 0–99)
NONHDL: 146.4
Total CHOL/HDL Ratio: 5
Triglycerides: 114 mg/dL (ref 0.0–149.0)
VLDL: 22.8 mg/dL (ref 0.0–40.0)

## 2014-05-27 LAB — HEPATIC FUNCTION PANEL
ALT: 23 U/L (ref 0–35)
AST: 30 U/L (ref 0–37)
Albumin: 4.3 g/dL (ref 3.5–5.2)
Alkaline Phosphatase: 65 U/L (ref 39–117)
BILIRUBIN DIRECT: 0 mg/dL (ref 0.0–0.3)
TOTAL PROTEIN: 7 g/dL (ref 6.0–8.3)
Total Bilirubin: 0.7 mg/dL (ref 0.2–1.2)

## 2014-05-28 ENCOUNTER — Telehealth: Payer: Self-pay | Admitting: Family Medicine

## 2014-05-28 NOTE — Telephone Encounter (Signed)
emmi emailed °

## 2014-08-14 ENCOUNTER — Encounter: Payer: Self-pay | Admitting: Family Medicine

## 2014-08-14 ENCOUNTER — Ambulatory Visit (INDEPENDENT_AMBULATORY_CARE_PROVIDER_SITE_OTHER): Payer: Medicare Other | Admitting: Family Medicine

## 2014-08-14 VITALS — BP 125/69 | HR 70 | Temp 98.5°F | Ht 66.25 in | Wt 170.0 lb

## 2014-08-14 DIAGNOSIS — B349 Viral infection, unspecified: Secondary | ICD-10-CM | POA: Diagnosis not present

## 2014-08-14 NOTE — Progress Notes (Signed)
Pre visit review using our clinic review tool, if applicable. No additional management support is needed unless otherwise documented below in the visit note. 

## 2014-08-14 NOTE — Progress Notes (Signed)
   Subjective:    Patient ID: Karen Watson, female    DOB: [birth year], 74 y.o.   MRN: [id number]  HPI Here with her husband for 10 days of fatigue, HAs, nausea without vomiting and light diarrhea. No cough or ST.    Review of Systems  Constitutional: Positive for fatigue. Negative for fever, chills and diaphoresis.  Eyes: Negative.   Respiratory: Negative.   Gastrointestinal: Positive for nausea and diarrhea. Negative for vomiting, abdominal pain, constipation, blood in stool, abdominal distention, anal bleeding and rectal pain.  Neurological: Positive for light-headedness and headaches.       Objective:   Physical Exam  Constitutional: She is oriented to person, place, and time. She appears well-developed and well-nourished. No distress.  HENT:  Right Ear: External ear normal.  Left Ear: External ear normal.  Nose: Nose normal.  Mouth/Throat: Oropharynx is clear and moist.  Eyes: Conjunctivae are normal. Pupils are equal, round, and reactive to light.  Neck: Neck supple. No thyromegaly present.  Pulmonary/Chest: Effort normal and breath sounds normal.  Abdominal: Soft. Bowel sounds are normal. She exhibits no distension and no mass. There is no tenderness. There is no rebound and no guarding.  Lymphadenopathy:    She has no cervical adenopathy.  Neurological: She is alert and oriented to person, place, and time.          Assessment & Plan:  This is a viral illness and she seems to have given it to her husband. Rest. Drink fluids, use Tylenol prn. This should resolve soon

## 2014-10-02 DIAGNOSIS — R109 Unspecified abdominal pain: Secondary | ICD-10-CM | POA: Diagnosis not present

## 2014-10-02 DIAGNOSIS — C7A090 Malignant carcinoid tumor of the bronchus and lung: Secondary | ICD-10-CM | POA: Diagnosis not present

## 2014-10-16 DIAGNOSIS — C7A090 Malignant carcinoid tumor of the bronchus and lung: Secondary | ICD-10-CM | POA: Diagnosis not present

## 2015-02-04 ENCOUNTER — Encounter: Payer: Self-pay | Admitting: Family Medicine

## 2015-02-04 ENCOUNTER — Ambulatory Visit (INDEPENDENT_AMBULATORY_CARE_PROVIDER_SITE_OTHER): Payer: Medicare Other | Admitting: Family Medicine

## 2015-02-04 VITALS — BP 137/70 | HR 71 | Temp 98.6°F | Ht 66.25 in | Wt 160.0 lb

## 2015-02-04 DIAGNOSIS — R21 Rash and other nonspecific skin eruption: Secondary | ICD-10-CM | POA: Diagnosis not present

## 2015-02-04 MED ORDER — PREDNISONE 10 MG PO TABS: ORAL_TABLET | ORAL | Status: DC
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
## 2015-02-04 NOTE — Progress Notes (Signed)
Pre visit review using our clinic review tool, if applicable. No additional management support is needed unless otherwise documented below in the visit note. 

## 2015-02-27 ENCOUNTER — Ambulatory Visit (INDEPENDENT_AMBULATORY_CARE_PROVIDER_SITE_OTHER): Payer: Medicare Other | Admitting: Family Medicine

## 2015-02-27 DIAGNOSIS — Z23 Encounter for immunization: Secondary | ICD-10-CM | POA: Diagnosis not present

## 2015-03-21 ENCOUNTER — Other Ambulatory Visit: Payer: Self-pay | Admitting: Family Medicine

## 2015-04-08 DIAGNOSIS — D1801 Hemangioma of skin and subcutaneous tissue: Secondary | ICD-10-CM | POA: Diagnosis not present

## 2015-04-08 DIAGNOSIS — D225 Melanocytic nevi of trunk: Secondary | ICD-10-CM | POA: Diagnosis not present

## 2015-04-08 DIAGNOSIS — L814 Other melanin hyperpigmentation: Secondary | ICD-10-CM | POA: Diagnosis not present

## 2015-04-08 DIAGNOSIS — L821 Other seborrheic keratosis: Secondary | ICD-10-CM | POA: Diagnosis not present

## 2015-05-09 ENCOUNTER — Other Ambulatory Visit: Payer: Self-pay | Admitting: Family Medicine

## 2015-05-09 NOTE — Telephone Encounter (Signed)
Can we refill this? 

## 2015-05-13 ENCOUNTER — Other Ambulatory Visit: Payer: Self-pay | Admitting: Family Medicine

## 2015-05-20 DIAGNOSIS — M5416 Radiculopathy, lumbar region: Secondary | ICD-10-CM | POA: Diagnosis not present

## 2015-05-22 ENCOUNTER — Ambulatory Visit (INDEPENDENT_AMBULATORY_CARE_PROVIDER_SITE_OTHER): Payer: Medicare Other | Admitting: Family Medicine

## 2015-05-22 ENCOUNTER — Encounter: Payer: Self-pay | Admitting: Family Medicine

## 2015-05-22 VITALS — BP 120/63 | HR 73 | Temp 99.3°F | Ht 66.25 in | Wt 160.0 lb

## 2015-05-22 DIAGNOSIS — N289 Disorder of kidney and ureter, unspecified: Secondary | ICD-10-CM | POA: Diagnosis not present

## 2015-05-22 DIAGNOSIS — Z0000 Encounter for general adult medical examination without abnormal findings: Secondary | ICD-10-CM | POA: Diagnosis not present

## 2015-05-22 DIAGNOSIS — J019 Acute sinusitis, unspecified: Secondary | ICD-10-CM

## 2015-05-22 DIAGNOSIS — I10 Essential (primary) hypertension: Secondary | ICD-10-CM | POA: Diagnosis not present

## 2015-05-22 LAB — TSH: TSH: 1.76 u[IU]/mL (ref 0.35–4.50)

## 2015-05-22 LAB — POCT URINALYSIS DIPSTICK
Bilirubin, UA: NEGATIVE
Glucose, UA: NEGATIVE
KETONES UA: NEGATIVE
Leukocytes, UA: NEGATIVE
Nitrite, UA: NEGATIVE
PROTEIN UA: NEGATIVE
RBC UA: NEGATIVE
SPEC GRAV UA: 1.02
UROBILINOGEN UA: 0.2
pH, UA: 6.5

## 2015-05-22 LAB — CBC WITH DIFFERENTIAL/PLATELET
BASOS ABS: 0.1 10*3/uL (ref 0.0–0.1)
Basophils Relative: 0.9 % (ref 0.0–3.0)
Eosinophils Absolute: 0.2 10*3/uL (ref 0.0–0.7)
Eosinophils Relative: 2.3 % (ref 0.0–5.0)
HCT: 41.7 % (ref 36.0–46.0)
Hemoglobin: 13.6 g/dL (ref 12.0–15.0)
LYMPHS ABS: 1.6 10*3/uL (ref 0.7–4.0)
Lymphocytes Relative: 22.6 % (ref 12.0–46.0)
MCHC: 32.8 g/dL (ref 30.0–36.0)
MCV: 94.6 fl (ref 78.0–100.0)
MONO ABS: 0.6 10*3/uL (ref 0.1–1.0)
MONOS PCT: 8.2 % (ref 3.0–12.0)
NEUTROS ABS: 4.5 10*3/uL (ref 1.4–7.7)
NEUTROS PCT: 66 % (ref 43.0–77.0)
PLATELETS: 226 10*3/uL (ref 150.0–400.0)
RBC: 4.41 Mil/uL (ref 3.87–5.11)
RDW: 14.7 % (ref 11.5–15.5)
WBC: 6.9 10*3/uL (ref 4.0–10.5)

## 2015-05-22 LAB — BASIC METABOLIC PANEL
BUN: 28 mg/dL — AB (ref 6–23)
CHLORIDE: 105 meq/L (ref 96–112)
CO2: 31 meq/L (ref 19–32)
CREATININE: 1.27 mg/dL — AB (ref 0.40–1.20)
Calcium: 10.6 mg/dL — ABNORMAL HIGH (ref 8.4–10.5)
GFR: 43.8 mL/min — ABNORMAL LOW (ref >60.00)
GLUCOSE: 104 mg/dL — AB (ref 70–99)
POTASSIUM: 5.4 meq/L — AB (ref 3.5–5.1)
Sodium: 144 mEq/L (ref 135–145)

## 2015-05-22 LAB — LIPID PANEL
CHOL/HDL RATIO: 5
CHOLESTEROL: 188 mg/dL (ref 0–200)
HDL: 36.9 mg/dL — ABNORMAL LOW (ref >39.00)
LDL Cholesterol: 122 mg/dL — ABNORMAL HIGH (ref 0–99)
NonHDL: 150.62
TRIGLYCERIDES: 142 mg/dL (ref 0.0–149.0)
VLDL: 28.4 mg/dL (ref 0.0–40.0)

## 2015-05-22 LAB — HEPATIC FUNCTION PANEL
ALBUMIN: 4.5 g/dL (ref 3.5–5.2)
ALT: 16 U/L (ref 0–35)
AST: 20 U/L (ref 0–37)
Alkaline Phosphatase: 79 U/L (ref 39–117)
BILIRUBIN TOTAL: 0.5 mg/dL (ref 0.2–1.2)
Bilirubin, Direct: 0.1 mg/dL (ref 0.0–0.3)
TOTAL PROTEIN: 7.3 g/dL (ref 6.0–8.3)

## 2015-05-22 MED ORDER — LEVOFLOXACIN 500 MG PO TABS: 500.0000 mg | ORAL_TABLET | Freq: Every day | ORAL | Status: AC

## 2015-05-22 NOTE — Progress Notes (Signed)
   Subjective:    Patient ID: Karen Watson, female    DOB: [birth year], 74 y.o.   MRN: [id number]  HPI Here for 5 days of sinus pressure, PND, ST, and a dry cough. Her husband has been sick with the same sx for 2 weeks, and he has not responded to a Zpack.    Review of Systems  Constitutional: Negative.   HENT: Positive for congestion, postnasal drip, sinus pressure and sore throat. Negative for ear pain.   Eyes: Negative.   Respiratory: Positive for cough.        Objective:   Physical Exam  Constitutional: She appears well-developed and well-nourished. No distress.  HENT:  Right Ear: External ear normal.  Left Ear: External ear normal.  Nose: Nose normal.  Mouth/Throat: Oropharynx is clear and moist.  Eyes: Conjunctivae are normal.  Neck: No thyromegaly present.  Pulmonary/Chest: Effort normal and breath sounds normal.  Lymphadenopathy:    She has no cervical adenopathy.          Assessment & Plan:  Sinusitis, treat with Levaquin. Add Mucinex

## 2015-05-22 NOTE — Progress Notes (Signed)
Pre visit review using our clinic review tool, if applicable. No additional management support is needed unless otherwise documented below in the visit note. 

## 2015-05-27 NOTE — Addendum Note (Signed)
Addended by: Alysia Penna A on: 05/27/2015 01:19 PM   Modules accepted: Orders

## 2015-05-29 DIAGNOSIS — M5416 Radiculopathy, lumbar region: Secondary | ICD-10-CM | POA: Diagnosis not present

## 2015-05-29 DIAGNOSIS — M5126 Other intervertebral disc displacement, lumbar region: Secondary | ICD-10-CM | POA: Diagnosis not present

## 2015-06-10 DIAGNOSIS — M5416 Radiculopathy, lumbar region: Secondary | ICD-10-CM | POA: Diagnosis not present

## 2015-06-12 DIAGNOSIS — R7989 Other specified abnormal findings of blood chemistry: Secondary | ICD-10-CM | POA: Diagnosis not present

## 2015-06-12 DIAGNOSIS — N2581 Secondary hyperparathyroidism of renal origin: Secondary | ICD-10-CM | POA: Diagnosis not present

## 2015-06-12 DIAGNOSIS — I129 Hypertensive chronic kidney disease with stage 1 through stage 4 chronic kidney disease, or unspecified chronic kidney disease: Secondary | ICD-10-CM | POA: Diagnosis not present

## 2015-06-12 DIAGNOSIS — R809 Proteinuria, unspecified: Secondary | ICD-10-CM | POA: Diagnosis not present

## 2015-06-14 ENCOUNTER — Other Ambulatory Visit: Payer: Self-pay | Admitting: Nephrology

## 2015-06-14 DIAGNOSIS — I129 Hypertensive chronic kidney disease with stage 1 through stage 4 chronic kidney disease, or unspecified chronic kidney disease: Secondary | ICD-10-CM

## 2015-06-14 DIAGNOSIS — R7989 Other specified abnormal findings of blood chemistry: Secondary | ICD-10-CM

## 2015-06-19 ENCOUNTER — Other Ambulatory Visit: Payer: Medicare Other

## 2015-06-27 DIAGNOSIS — Z91041 Radiographic dye allergy status: Secondary | ICD-10-CM | POA: Diagnosis not present

## 2015-06-27 DIAGNOSIS — M5416 Radiculopathy, lumbar region: Secondary | ICD-10-CM | POA: Diagnosis not present

## 2015-06-27 DIAGNOSIS — M7542 Impingement syndrome of left shoulder: Secondary | ICD-10-CM | POA: Diagnosis not present

## 2015-06-27 DIAGNOSIS — M7541 Impingement syndrome of right shoulder: Secondary | ICD-10-CM | POA: Diagnosis not present

## 2015-06-27 DIAGNOSIS — M4806 Spinal stenosis, lumbar region: Secondary | ICD-10-CM | POA: Diagnosis not present

## 2015-07-01 ENCOUNTER — Encounter: Payer: Medicare Other | Admitting: Family Medicine

## 2015-07-03 DIAGNOSIS — M5416 Radiculopathy, lumbar region: Secondary | ICD-10-CM | POA: Diagnosis not present

## 2015-07-03 DIAGNOSIS — M4806 Spinal stenosis, lumbar region: Secondary | ICD-10-CM | POA: Diagnosis not present

## 2015-07-04 ENCOUNTER — Ambulatory Visit (INDEPENDENT_AMBULATORY_CARE_PROVIDER_SITE_OTHER): Payer: Medicare Other | Admitting: Family Medicine

## 2015-07-04 ENCOUNTER — Encounter: Payer: Self-pay | Admitting: Family Medicine

## 2015-07-04 VITALS — BP 131/77 | HR 73 | Temp 98.3°F | Ht 66.25 in | Wt 160.0 lb

## 2015-07-04 DIAGNOSIS — I10 Essential (primary) hypertension: Secondary | ICD-10-CM

## 2015-07-04 DIAGNOSIS — Z0000 Encounter for general adult medical examination without abnormal findings: Secondary | ICD-10-CM

## 2015-07-04 DIAGNOSIS — M810 Age-related osteoporosis without current pathological fracture: Secondary | ICD-10-CM

## 2015-07-04 MED ORDER — MELOXICAM 15 MG PO TABS: ORAL_TABLET | ORAL | Status: DC

## 2015-07-04 MED ORDER — TRIAMTERENE-HCTZ 37.5-25 MG PO TABS: ORAL_TABLET | ORAL | Status: DC

## 2015-07-04 MED ORDER — AMLODIPINE BESYLATE 5 MG PO TABS: 5.0000 mg | ORAL_TABLET | Freq: Every day | ORAL | Status: DC

## 2015-07-04 MED ORDER — GABAPENTIN 300 MG PO CAPS: ORAL_CAPSULE | ORAL | Status: DC

## 2015-07-04 NOTE — Progress Notes (Signed)
   Subjective:    Patient ID: Karen Watson, female    DOB: 11-Jan-1941, 75 y.o.   MRN: [id number]  HPI 75 yr old female for a cpx. She feels well in general but has a few issues to discuss. Her BP has been stable, but looking at her labs she has had some mild renal insufficiency the past few years. This seems to be stable but we dicussed if any of her current medications could be contributing to this. She does take Meloxicam and Lisinopril. She has chronic back pain that is fairly severe and the Meloxicam helps a lot. Also she is past due for a DEXA. Her glucose and her LDL are mildly elevated.  Review of Systems  Constitutional: Negative.   HENT: Negative.   Eyes: Negative.   Respiratory: Negative.   Cardiovascular: Negative.   Gastrointestinal: Negative.   Genitourinary: Negative for dysuria, urgency, frequency, hematuria, flank pain, decreased urine volume, enuresis, difficulty urinating, pelvic pain and dyspareunia.  Musculoskeletal: Positive for back pain and arthralgias. Negative for myalgias, joint swelling, gait problem, neck pain and neck stiffness.  Skin: Negative.   Neurological: Negative.   Psychiatric/Behavioral: Negative.        Objective:   Physical Exam  Constitutional: She is oriented to person, place, and time. She appears well-developed and well-nourished. No distress.  HENT:  Head: Normocephalic and atraumatic.  Right Ear: External ear normal.  Left Ear: External ear normal.  Nose: Nose normal.  Mouth/Throat: Oropharynx is clear and moist. No oropharyngeal exudate.  Eyes: Conjunctivae and EOM are normal. Pupils are equal, round, and reactive to light. No scleral icterus.  Neck: Normal range of motion. Neck supple. No JVD present. No thyromegaly present.  Cardiovascular: Normal rate, regular rhythm, normal heart sounds and intact distal pulses.  Exam reveals no gallop and no friction rub.   No murmur heard. EKG normal   Pulmonary/Chest: Effort normal and breath  sounds normal. No respiratory distress. She has no wheezes. She has no rales. She exhibits no tenderness.  Abdominal: Soft. Bowel sounds are normal. She exhibits no distension and no mass. There is no tenderness. There is no rebound and no guarding.  Musculoskeletal: Normal range of motion. She exhibits no edema or tenderness.  Lymphadenopathy:    She has no cervical adenopathy.  Neurological: She is alert and oriented to person, place, and time. She has normal reflexes. No cranial nerve deficit. She exhibits normal muscle tone. Coordination normal.  Skin: Skin is warm and dry. No rash noted. No erythema.  Psychiatric: She has a normal mood and affect. Her behavior is normal. Judgment and thought content normal.          Assessment & Plan:  Well exam. We discussed diet and exercise. To protect her kidney function she will stop Lisinopril and switch to Amlodipine 5 mg daily. Set up a DEXA soon. She will set up a mammogram soon.

## 2015-07-04 NOTE — Addendum Note (Signed)
Addended by: Alysia Penna A on: 07/04/2015 09:54 PM   Modules accepted: Orders

## 2015-07-04 NOTE — Progress Notes (Signed)
Pre visit review using our clinic review tool, if applicable. No additional management support is needed unless otherwise documented below in the visit note. 

## 2015-07-24 DIAGNOSIS — M4806 Spinal stenosis, lumbar region: Secondary | ICD-10-CM | POA: Diagnosis not present

## 2015-07-24 DIAGNOSIS — M5416 Radiculopathy, lumbar region: Secondary | ICD-10-CM | POA: Diagnosis not present

## 2015-08-01 DIAGNOSIS — Z881 Allergy status to other antibiotic agents status: Secondary | ICD-10-CM | POA: Diagnosis not present

## 2015-08-01 DIAGNOSIS — M79604 Pain in right leg: Secondary | ICD-10-CM | POA: Diagnosis not present

## 2015-08-01 DIAGNOSIS — Z884 Allergy status to anesthetic agent status: Secondary | ICD-10-CM | POA: Diagnosis not present

## 2015-08-01 DIAGNOSIS — Z791 Long term (current) use of non-steroidal anti-inflammatories (NSAID): Secondary | ICD-10-CM | POA: Diagnosis not present

## 2015-08-01 DIAGNOSIS — Z880 Allergy status to penicillin: Secondary | ICD-10-CM | POA: Diagnosis not present

## 2015-08-01 DIAGNOSIS — Z79899 Other long term (current) drug therapy: Secondary | ICD-10-CM | POA: Diagnosis not present

## 2015-08-01 DIAGNOSIS — M519 Unspecified thoracic, thoracolumbar and lumbosacral intervertebral disc disorder: Secondary | ICD-10-CM | POA: Diagnosis not present

## 2015-08-01 DIAGNOSIS — D3A00 Benign carcinoid tumor of unspecified site: Secondary | ICD-10-CM | POA: Diagnosis not present

## 2015-08-01 DIAGNOSIS — Z885 Allergy status to narcotic agent status: Secondary | ICD-10-CM | POA: Diagnosis not present

## 2015-08-01 DIAGNOSIS — Z91048 Other nonmedicinal substance allergy status: Secondary | ICD-10-CM | POA: Diagnosis not present

## 2015-08-07 DIAGNOSIS — M4806 Spinal stenosis, lumbar region: Secondary | ICD-10-CM | POA: Diagnosis not present

## 2015-08-07 DIAGNOSIS — I10 Essential (primary) hypertension: Secondary | ICD-10-CM | POA: Diagnosis not present

## 2015-08-15 ENCOUNTER — Ambulatory Visit (INDEPENDENT_AMBULATORY_CARE_PROVIDER_SITE_OTHER)
Admission: RE | Admit: 2015-08-15 | Discharge: 2015-08-15 | Disposition: A | Discharge status: Home / Self Care | Payer: Medicare Other | Source: Ambulatory Visit | Attending: Family Medicine | Admitting: Family Medicine

## 2015-08-15 DIAGNOSIS — M810 Age-related osteoporosis without current pathological fracture: Secondary | ICD-10-CM | POA: Diagnosis not present

## 2015-08-28 DIAGNOSIS — M4806 Spinal stenosis, lumbar region: Secondary | ICD-10-CM | POA: Diagnosis not present

## 2015-08-28 DIAGNOSIS — M5416 Radiculopathy, lumbar region: Secondary | ICD-10-CM | POA: Diagnosis not present

## 2015-09-10 DIAGNOSIS — R809 Proteinuria, unspecified: Secondary | ICD-10-CM | POA: Diagnosis not present

## 2015-09-10 DIAGNOSIS — R7989 Other specified abnormal findings of blood chemistry: Secondary | ICD-10-CM | POA: Diagnosis not present

## 2015-09-10 DIAGNOSIS — N2581 Secondary hyperparathyroidism of renal origin: Secondary | ICD-10-CM | POA: Diagnosis not present

## 2015-09-10 DIAGNOSIS — I129 Hypertensive chronic kidney disease with stage 1 through stage 4 chronic kidney disease, or unspecified chronic kidney disease: Secondary | ICD-10-CM | POA: Diagnosis not present

## 2015-09-11 DIAGNOSIS — R928 Other abnormal and inconclusive findings on diagnostic imaging of breast: Secondary | ICD-10-CM | POA: Diagnosis not present

## 2015-09-16 DIAGNOSIS — M5416 Radiculopathy, lumbar region: Secondary | ICD-10-CM | POA: Diagnosis not present

## 2015-09-16 DIAGNOSIS — M4806 Spinal stenosis, lumbar region: Secondary | ICD-10-CM | POA: Diagnosis not present

## 2015-09-16 DIAGNOSIS — I10 Essential (primary) hypertension: Secondary | ICD-10-CM | POA: Diagnosis not present

## 2015-09-23 ENCOUNTER — Other Ambulatory Visit: Payer: Self-pay | Admitting: Neurological Surgery

## 2015-09-23 DIAGNOSIS — M5416 Radiculopathy, lumbar region: Secondary | ICD-10-CM | POA: Diagnosis not present

## 2015-10-02 NOTE — Pre-Procedure Instructions (Signed)
AKYIA BORELLI  10/02/2015      [id number] [place], [place] - [address] [place] [address]Phone: [phone]: [phone] [place] [address] [place]Phone: [phone]: [phone]   Your procedure is scheduled on  Wednesday  10/09/15.  [facility]  Call this number if you have problems the morning of surgery:  [phone]   Remember:  Do not eat food or drink liquids after midnight.  Take these medicines the morning of surgery with A SIP OF WATER   AMLODIIPINE (NORVASC), GABAPENTIN, TYLENOL IF NEEDED       (STOP 7 DAYS PRIOR TO SURGERY  FISH OIL OMEGA3, MELOXICAM/ MOBIC, MULTIVITAMIN, VIT E, NO ASPIRIN OR ASPIRIN PRODUCTS, IBUPROFEN/ ADVIL/ MOTRIN, GOODY POWDERS/ BCS, HERBAL MEDICINES )   Do not wear jewelry, make-up or nail polish.  Do not wear lotions, powders, or perfumes.  You may wear deodorant.  Do not shave 48 hours prior to surgery.  Men may shave face and neck.  Do not bring valuables to the hospital.  [facility] belongings or [facility].  Contacts, dentures or bridgework may not be worn into surgery.  Leave your suitcase in the car.  After surgery it may be brought to your room.  For patients admitted to the hospital, discharge time will be determined by your treatment team.  Patients discharged the day of surgery will not be allowed to drive home.   Name and phone number of your driver:   Special instructions:  [facility]- Preparing for Surgery  Before surgery, you can play an important role.  Because skin is not sterile, your skin needs to be as free of germs as possible.  You can reduce the number of germs on you skin by washing with CHG (chlorahexidine gluconate) soap before surgery.  CHG is an antiseptic cleaner which kills germs  and bonds with the skin to continue killing germs even after washing.  Please DO NOT use if you have an allergy to CHG or antibacterial soaps.  If your skin becomes reddened/irritated stop using the CHG and inform your nurse when you arrive at Short Stay.  Do not shave (including legs and underarms) for at least 48 hours prior to the first CHG shower.  You may shave your face.  Please follow these instructions carefully:   1.  Shower with CHG Soap the night before surgery and the                                morning of Surgery.  2.  If you choose to wash your hair, wash your hair first as usual with your       normal shampoo.  3.  After you shampoo, rinse your hair and body thoroughly to remove the                      Shampoo.  4.  Use CHG as you would any other liquid soap.  You can apply chg directly       to the skin and wash gently with scrungie or a clean washcloth.  5.  Apply the CHG Soap to your body ONLY FROM THE NECK  DOWN.        Do not use on open wounds or open sores.  Avoid contact with your eyes,       ears, mouth and genitals (private parts).  Wash genitals (private parts)       with your normal soap.  6.  Wash thoroughly, paying special attention to the area where your surgery        will be performed.  7.  Thoroughly rinse your body with warm water from the neck down.  8.  DO NOT shower/wash with your normal soap after using and rinsing off       the CHG Soap.  9.  Pat yourself dry with a clean towel.            10.  Wear clean pajamas.            11.  Place clean sheets on your bed the night of your first shower and do not        sleep with pets.  Day of Surgery  Do not apply any lotions/deoderants the morning of surgery.  Please wear clean clothes to the hospital/surgery center.    Please read over the following fact sheets that you were given. Pain Booklet, Coughing and Deep Breathing, MRSA Information and Surgical Site Infection Prevention

## 2015-10-03 ENCOUNTER — Encounter (HOSPITAL_COMMUNITY)
Admission: RE | Admit: 2015-10-03 | Discharge: 2015-10-03 | Disposition: A | Discharge status: Home / Self Care | Payer: Medicare Other | Source: Ambulatory Visit | Attending: Neurological Surgery | Admitting: Neurological Surgery

## 2015-10-03 ENCOUNTER — Encounter (HOSPITAL_COMMUNITY): Payer: Self-pay

## 2015-10-03 DIAGNOSIS — I129 Hypertensive chronic kidney disease with stage 1 through stage 4 chronic kidney disease, or unspecified chronic kidney disease: Secondary | ICD-10-CM | POA: Diagnosis not present

## 2015-10-03 DIAGNOSIS — Z79899 Other long term (current) drug therapy: Secondary | ICD-10-CM | POA: Insufficient documentation

## 2015-10-03 DIAGNOSIS — Z01812 Encounter for preprocedural laboratory examination: Secondary | ICD-10-CM | POA: Diagnosis not present

## 2015-10-03 DIAGNOSIS — N183 Chronic kidney disease, stage 3 (moderate): Secondary | ICD-10-CM | POA: Diagnosis not present

## 2015-10-03 DIAGNOSIS — Z981 Arthrodesis status: Secondary | ICD-10-CM | POA: Insufficient documentation

## 2015-10-03 DIAGNOSIS — Z85118 Personal history of other malignant neoplasm of bronchus and lung: Secondary | ICD-10-CM | POA: Diagnosis not present

## 2015-10-03 DIAGNOSIS — M7130 Other bursal cyst, unspecified site: Secondary | ICD-10-CM | POA: Insufficient documentation

## 2015-10-03 DIAGNOSIS — Z01818 Encounter for other preprocedural examination: Secondary | ICD-10-CM | POA: Insufficient documentation

## 2015-10-03 DIAGNOSIS — R918 Other nonspecific abnormal finding of lung field: Secondary | ICD-10-CM | POA: Diagnosis not present

## 2015-10-03 DIAGNOSIS — R911 Solitary pulmonary nodule: Secondary | ICD-10-CM | POA: Diagnosis not present

## 2015-10-03 HISTORY — DX: Other specified postprocedural states: Z98.890

## 2015-10-03 HISTORY — DX: Chronic kidney disease, unspecified: N18.9

## 2015-10-03 HISTORY — DX: Nausea with vomiting, unspecified: R11.2

## 2015-10-03 LAB — BASIC METABOLIC PANEL
ANION GAP: 11 (ref 5–15)
BUN: 24 mg/dL — AB (ref 6–20)
CHLORIDE: 102 mmol/L (ref 101–111)
CO2: 27 mmol/L (ref 22–32)
Calcium: 9.9 mg/dL (ref 8.9–10.3)
Creatinine, Ser: 1.16 mg/dL — ABNORMAL HIGH (ref 0.44–1.00)
GFR calc Af Amer: 53 mL/min — ABNORMAL LOW (ref >60)
GFR, EST NON AFRICAN AMERICAN: 46 mL/min — AB (ref >60)
GLUCOSE: 112 mg/dL — AB (ref 65–99)
POTASSIUM: 3.7 mmol/L (ref 3.5–5.1)
Sodium: 140 mmol/L (ref 135–145)

## 2015-10-03 LAB — CBC WITH DIFFERENTIAL/PLATELET
Basophils Absolute: 0 10*3/uL (ref 0.0–0.1)
Basophils Relative: 1 %
EOS PCT: 3 %
Eosinophils Absolute: 0.2 10*3/uL (ref 0.0–0.7)
HEMATOCRIT: 40.5 % (ref 36.0–46.0)
HEMOGLOBIN: 12.9 g/dL (ref 12.0–15.0)
LYMPHS ABS: 2 10*3/uL (ref 0.7–4.0)
LYMPHS PCT: 33 %
MCH: 30.6 pg (ref 26.0–34.0)
MCHC: 31.9 g/dL (ref 30.0–36.0)
MCV: 96 fL (ref 78.0–100.0)
MONO ABS: 0.4 10*3/uL (ref 0.1–1.0)
MONOS PCT: 7 %
NEUTROS ABS: 3.4 10*3/uL (ref 1.7–7.7)
Neutrophils Relative %: 56 %
PLATELETS: 209 10*3/uL (ref 150–400)
RBC: 4.22 MIL/uL (ref 3.87–5.11)
RDW: 14.4 % (ref 11.5–15.5)
WBC: 6.1 10*3/uL (ref 4.0–10.5)

## 2015-10-03 LAB — SURGICAL PCR SCREEN
MRSA, PCR: NEGATIVE
Staphylococcus aureus: NEGATIVE

## 2015-10-03 LAB — PROTIME-INR
INR: 1.01 (ref 0.00–1.49)
Prothrombin Time: 13.5 seconds (ref 11.6–15.2)

## 2015-10-03 NOTE — Progress Notes (Signed)
PCP is Dr. Alysia Penna Denies ever seeing a cardiologist Denies ever having a card cath, echo, and stress test Ekg noted from 07-04-15

## 2015-10-03 NOTE — Progress Notes (Addendum)
Anesthesia Chart Review:  Pt is a 75 year old female scheduled for L5-S1 extraforaminal foraminotomy with removal of synovial cyst on 10/09/2015 with Dr. Ronnald Ramp.   PCP is Dr. Alysia Penna.   PMH includes:  HTN, CKD (stage 3), lung cancer, post-op N/V. Never smoker. BMI 27. S/p lumbar fusion 06/08/12.   Medications include: amlodipine, potassium, triamterene-hctz.   Preoperative labs reviewed.    Chest x-ray 10/03/15 reviewed.  1. Stable circumscribed nodule in the right lower lobe. 2. Vague opacity within the right lung base as well more cephalad and lateral. Recommend followup chest x-ray to exclude developing pneumonia or nodule. 3. Peribronchial thickening consistent with bronchitis.  EKG 07/04/15: Sinus  Rhythm Nonspecific T-abnormality.   Left voicemail for Lorriane Shire in Dr. Ronnald Ramp' office with CXR results. I also routed CXR results to PCP.   No symptoms of illness documented at PAT. Pt will need further assessment by assigned anesthesiologist DOS.   Willeen Cass, FNP-BC [facility] Short Stay Surgical Center/Anesthesiology Phone: [phone] 10/03/2015 4:40 PM  Addendum:   Damaris Schooner with Janett Billow at Dr. Ronnald Ramp' office. He would like CXR repeated DOS. Order entered.   Willeen Cass, FNP-BC [facility] Short Stay Surgical Center/Anesthesiology Phone: [phone] 10/07/2015 3:05 PM

## 2015-10-08 MED ORDER — VANCOMYCIN HCL IN DEXTROSE 1-5 GM/200ML-% IV SOLN
1000.0000 mg | INTRAVENOUS | Status: AC
  Administered 2015-10-09: 1000 mg via INTRAVENOUS
  Filled 2015-10-08: qty 200

## 2015-10-08 MED ORDER — DEXAMETHASONE SODIUM PHOSPHATE 10 MG/ML IJ SOLN
10.0000 mg | INTRAMUSCULAR | Status: AC
  Administered 2015-10-09: 10 mg via INTRAVENOUS
  Filled 2015-10-08: qty 1

## 2015-10-08 NOTE — Anesthesia Preprocedure Evaluation (Addendum)
Anesthesia Evaluation  Patient identified by MRN, date of birth, ID band Patient awake  General Assessment Comment:HTN, CKD (stage 3), lung cancer, post-op N/V. Never smoker. BMI 27. S/p lumbar fusion 06/08/12.   Reviewed: Allergy & Precautions, NPO status , Patient's Chart, lab work & pertinent test results  History of Anesthesia Complications (+) PONV, Family history of anesthesia reaction and history of anesthetic complications  Airway Mallampati: II  TM Distance: >3 FB Neck ROM: Full    Dental no notable dental hx. (+) Teeth Intact, Dental Advisory Given   Pulmonary  Lung cancer   Pulmonary exam normal breath sounds clear to auscultation       Cardiovascular hypertension, Pt. on medications Normal cardiovascular exam Rhythm:Regular Rate:Normal  EKG 07/04/15: Sinus Rhythm Nonspecific T-abnormality   Neuro/Psych  Headaches, PSYCHIATRIC DISORDERS Anxiety Depression Chronic neck pain negative neurological ROS     GI/Hepatic negative GI ROS, Neg liver ROS,   Endo/Other  negative endocrine ROS  Renal/GU Renal InsufficiencyRenal disease     Musculoskeletal  (+) Arthritis , Osteoarthritis,    Abdominal   Peds  Hematology negative hematology ROS (+)   Anesthesia Other Findings Day of surgery medications reviewed with the patient.  Reproductive/Obstetrics                        Anesthesia Physical Anesthesia Plan  ASA: III  Anesthesia Plan: General   Post-op Pain Management:    Induction: Intravenous  Airway Management Planned: Oral ETT  Additional Equipment:   Intra-op Plan:   Post-operative Plan: Extubation in OR  Informed Consent: I have reviewed the patients History and Physical, chart, labs and discussed the procedure including the risks, benefits and alternatives for the proposed anesthesia with the patient or authorized representative who has indicated his/her understanding and  acceptance.   Dental advisory given  Plan Discussed with: CRNA  Anesthesia Plan Comments: (Risks/benefits of general anesthesia discussed with patient including risk of damage to teeth, lips, gum, and tongue, nausea/vomiting, allergic reactions to medications, and the possibility of heart attack, stroke and death.  All patient questions answered.  Patient wishes to proceed.  Multiple allergies reviewed with patient.)       Anesthesia Quick Evaluation                                  Anesthesia Evaluation  Patient identified by MRN, date of birth, ID band Patient awake    Reviewed: Allergy & Precautions, H&P , NPO status , Patient's Chart, lab work & pertinent test results  Airway Mallampati: II      Dental No notable dental hx.    Pulmonary  CXR noted. Dr. Ronnald Ramp talked to the patient in detail about the findings and follow up details and issues. Confirmed all with patient of this discussion that occurred preop 0815 CE          Cardiovascular hypertension, Pt. on medications Rhythm:Regular Rate:Normal     Neuro/Psych    GI/Hepatic negative GI ROS, Neg liver ROS,   Endo/Other  negative endocrine ROS  Renal/GU negative Renal ROS     Musculoskeletal   Abdominal   Peds  Hematology   Anesthesia Other Findings   Reproductive/Obstetrics                          Anesthesia Physical Anesthesia Plan  ASA: III  Anesthesia Plan: General  Post-op Pain Management:    Induction: Intravenous  Airway Management Planned: Oral ETT  Additional Equipment:   Intra-op Plan:   Post-operative Plan: Post-operative intubation/ventilation  Informed Consent: I have reviewed the patients History and Physical, chart, labs and discussed the procedure including the risks, benefits and alternatives for the proposed anesthesia with the patient or authorized representative who has indicated his/her understanding and acceptance.   Dental  advisory given  Plan Discussed with: CRNA, Anesthesiologist and Surgeon  Anesthesia Plan Comments:         Anesthesia Quick Evaluation

## 2015-10-09 ENCOUNTER — Encounter (HOSPITAL_COMMUNITY): Payer: Self-pay | Admitting: Neurological Surgery

## 2015-10-09 ENCOUNTER — Ambulatory Visit (HOSPITAL_COMMUNITY)
Admission: RE | Admit: 2015-10-09 | Discharge: 2015-10-10 | Disposition: A | Discharge status: Home / Self Care | Payer: Medicare Other | Source: Ambulatory Visit | Attending: Neurological Surgery | Admitting: Neurological Surgery

## 2015-10-09 ENCOUNTER — Encounter (HOSPITAL_COMMUNITY)
Admission: RE | Disposition: A | Discharge status: Home / Self Care | Payer: Self-pay | Source: Ambulatory Visit | Attending: Neurological Surgery

## 2015-10-09 ENCOUNTER — Inpatient Hospital Stay (HOSPITAL_COMMUNITY): Payer: Medicare Other | Admitting: Emergency Medicine

## 2015-10-09 ENCOUNTER — Inpatient Hospital Stay (HOSPITAL_COMMUNITY): Payer: Medicare Other

## 2015-10-09 ENCOUNTER — Inpatient Hospital Stay (HOSPITAL_COMMUNITY): Payer: Medicare Other | Admitting: Anesthesiology

## 2015-10-09 DIAGNOSIS — J439 Emphysema, unspecified: Secondary | ICD-10-CM | POA: Diagnosis not present

## 2015-10-09 DIAGNOSIS — M7138 Other bursal cyst, other site: Principal | ICD-10-CM | POA: Insufficient documentation

## 2015-10-09 DIAGNOSIS — N183 Chronic kidney disease, stage 3 (moderate): Secondary | ICD-10-CM | POA: Diagnosis not present

## 2015-10-09 DIAGNOSIS — Z9889 Other specified postprocedural states: Secondary | ICD-10-CM

## 2015-10-09 DIAGNOSIS — Z419 Encounter for procedure for purposes other than remedying health state, unspecified: Secondary | ICD-10-CM

## 2015-10-09 DIAGNOSIS — M159 Polyosteoarthritis, unspecified: Secondary | ICD-10-CM | POA: Insufficient documentation

## 2015-10-09 DIAGNOSIS — M542 Cervicalgia: Secondary | ICD-10-CM | POA: Diagnosis not present

## 2015-10-09 DIAGNOSIS — Z791 Long term (current) use of non-steroidal anti-inflammatories (NSAID): Secondary | ICD-10-CM | POA: Diagnosis not present

## 2015-10-09 DIAGNOSIS — Z981 Arthrodesis status: Secondary | ICD-10-CM | POA: Diagnosis not present

## 2015-10-09 DIAGNOSIS — Z79899 Other long term (current) drug therapy: Secondary | ICD-10-CM | POA: Insufficient documentation

## 2015-10-09 DIAGNOSIS — Z01818 Encounter for other preprocedural examination: Secondary | ICD-10-CM | POA: Diagnosis not present

## 2015-10-09 DIAGNOSIS — Z85110 Personal history of malignant carcinoid tumor of bronchus and lung: Secondary | ICD-10-CM | POA: Diagnosis not present

## 2015-10-09 DIAGNOSIS — I129 Hypertensive chronic kidney disease with stage 1 through stage 4 chronic kidney disease, or unspecified chronic kidney disease: Secondary | ICD-10-CM | POA: Diagnosis not present

## 2015-10-09 DIAGNOSIS — M1990 Unspecified osteoarthritis, unspecified site: Secondary | ICD-10-CM | POA: Diagnosis not present

## 2015-10-09 DIAGNOSIS — R9389 Abnormal findings on diagnostic imaging of other specified body structures: Secondary | ICD-10-CM

## 2015-10-09 DIAGNOSIS — M5416 Radiculopathy, lumbar region: Secondary | ICD-10-CM | POA: Insufficient documentation

## 2015-10-09 DIAGNOSIS — M545 Low back pain: Secondary | ICD-10-CM | POA: Diagnosis not present

## 2015-10-09 DIAGNOSIS — M4326 Fusion of spine, lumbar region: Secondary | ICD-10-CM | POA: Diagnosis not present

## 2015-10-09 DIAGNOSIS — M21371 Foot drop, right foot: Secondary | ICD-10-CM | POA: Diagnosis not present

## 2015-10-09 DIAGNOSIS — M7130 Other bursal cyst, unspecified site: Secondary | ICD-10-CM | POA: Diagnosis not present

## 2015-10-09 HISTORY — PX: LUMBAR LAMINECTOMY/DECOMPRESSION MICRODISCECTOMY: SHX5026

## 2015-10-09 SURGERY — LUMBAR LAMINECTOMY/DECOMPRESSION MICRODISCECTOMY 1 LEVEL
Anesthesia: General | Site: Spine Lumbar | Laterality: Right

## 2015-10-09 MED ORDER — ONDANSETRON HCL 4 MG/2ML IJ SOLN: 4.0000 mg | INTRAMUSCULAR | Status: DC | PRN

## 2015-10-09 MED ORDER — GLYCOPYRROLATE 0.2 MG/ML IJ SOLN
INTRAMUSCULAR | Status: DC | PRN
Start: 2015-10-09 — End: 2015-10-09
  Administered 2015-10-09: 0.6 mg via INTRAVENOUS

## 2015-10-09 MED ORDER — SODIUM CHLORIDE 0.9 % IV SOLN: 250.0000 mL | INTRAVENOUS | Status: DC

## 2015-10-09 MED ORDER — METHYLPREDNISOLONE ACETATE 80 MG/ML IJ SUSP
INTRAMUSCULAR | Status: DC | PRN
  Administered 2015-10-09: 80 mg

## 2015-10-09 MED ORDER — FENTANYL CITRATE (PF) 100 MCG/2ML IJ SOLN
INTRAMUSCULAR | Status: AC
  Filled 2015-10-09: qty 2

## 2015-10-09 MED ORDER — VANCOMYCIN HCL IN DEXTROSE 1-5 GM/200ML-% IV SOLN
1000.0000 mg | Freq: Once | INTRAVENOUS | Status: AC
  Administered 2015-10-09: 1000 mg via INTRAVENOUS
  Filled 2015-10-09: qty 200

## 2015-10-09 MED ORDER — SODIUM CHLORIDE 0.9 % IR SOLN
Status: DC | PRN
  Administered 2015-10-09: 09:00:00

## 2015-10-09 MED ORDER — SODIUM CHLORIDE 0.9% FLUSH: 3.0000 mL | INTRAVENOUS | Status: DC | PRN

## 2015-10-09 MED ORDER — FENTANYL CITRATE (PF) 100 MCG/2ML IJ SOLN
25.0000 ug | INTRAMUSCULAR | Status: DC | PRN
  Administered 2015-10-09 (×3): 50 ug via INTRAVENOUS

## 2015-10-09 MED ORDER — AMLODIPINE BESYLATE 5 MG PO TABS
5.0000 mg | ORAL_TABLET | Freq: Every day | ORAL | Status: DC
  Filled 2015-10-09: qty 1

## 2015-10-09 MED ORDER — ROCURONIUM BROMIDE 50 MG/5ML IV SOLN
INTRAVENOUS | Status: AC
  Filled 2015-10-09: qty 1

## 2015-10-09 MED ORDER — PHENOL 1.4 % MT LIQD: 1.0000 | OROMUCOSAL | Status: DC | PRN

## 2015-10-09 MED ORDER — LIDOCAINE HCL (CARDIAC) 20 MG/ML IV SOLN
INTRAVENOUS | Status: DC | PRN
  Administered 2015-10-09: 100 mg via INTRAVENOUS

## 2015-10-09 MED ORDER — ONDANSETRON HCL 4 MG/2ML IJ SOLN
INTRAMUSCULAR | Status: DC | PRN
  Administered 2015-10-09: 4 mg via INTRAVENOUS

## 2015-10-09 MED ORDER — NEOSTIGMINE METHYLSULFATE 10 MG/10ML IV SOLN
INTRAVENOUS | Status: DC | PRN
  Administered 2015-10-09: 4 mg via INTRAVENOUS

## 2015-10-09 MED ORDER — TRIAMTERENE-HCTZ 37.5-25 MG PO TABS
1.0000 | ORAL_TABLET | Freq: Every day | ORAL | Status: DC
  Filled 2015-10-09 (×2): qty 1

## 2015-10-09 MED ORDER — MELOXICAM 7.5 MG PO TABS
15.0000 mg | ORAL_TABLET | Freq: Every day | ORAL | Status: DC
  Administered 2015-10-09: 15 mg via ORAL
  Filled 2015-10-09 (×2): qty 1

## 2015-10-09 MED ORDER — ONDANSETRON HCL 4 MG/2ML IJ SOLN
4.0000 mg | Freq: Once | INTRAMUSCULAR | Status: AC | PRN
  Administered 2015-10-09: 4 mg via INTRAVENOUS

## 2015-10-09 MED ORDER — FENTANYL CITRATE (PF) 250 MCG/5ML IJ SOLN
INTRAMUSCULAR | Status: AC
  Filled 2015-10-09: qty 5

## 2015-10-09 MED ORDER — MORPHINE SULFATE (PF) 2 MG/ML IV SOLN
1.0000 mg | INTRAVENOUS | Status: DC | PRN
  Administered 2015-10-09: 2 mg via INTRAVENOUS
  Filled 2015-10-09: qty 1

## 2015-10-09 MED ORDER — LACTATED RINGERS IV SOLN
INTRAVENOUS | Status: DC | PRN
  Administered 2015-10-09 (×2): via INTRAVENOUS

## 2015-10-09 MED ORDER — ACETAMINOPHEN 325 MG PO TABS: 650.0000 mg | ORAL_TABLET | ORAL | Status: DC | PRN

## 2015-10-09 MED ORDER — DEXAMETHASONE SODIUM PHOSPHATE 4 MG/ML IJ SOLN
INTRAMUSCULAR | Status: AC
  Filled 2015-10-09: qty 2

## 2015-10-09 MED ORDER — ROCURONIUM BROMIDE 100 MG/10ML IV SOLN
INTRAVENOUS | Status: DC | PRN
  Administered 2015-10-09: 50 mg via INTRAVENOUS

## 2015-10-09 MED ORDER — THROMBIN 5000 UNITS EX SOLR
CUTANEOUS | Status: DC | PRN
  Administered 2015-10-09 (×2): 5000 [IU] via TOPICAL

## 2015-10-09 MED ORDER — ONDANSETRON HCL 4 MG/2ML IJ SOLN
INTRAMUSCULAR | Status: AC
  Filled 2015-10-09: qty 2

## 2015-10-09 MED ORDER — POTASSIUM CHLORIDE IN NACL 20-0.9 MEQ/L-% IV SOLN
INTRAVENOUS | Status: DC
  Filled 2015-10-09 (×3): qty 1000

## 2015-10-09 MED ORDER — OXYCODONE-ACETAMINOPHEN 5-325 MG PO TABS
1.0000 | ORAL_TABLET | ORAL | Status: DC | PRN
  Administered 2015-10-09 – 2015-10-10 (×5): 2 via ORAL
  Filled 2015-10-09 (×5): qty 2

## 2015-10-09 MED ORDER — PROPOFOL 10 MG/ML IV BOLUS
INTRAVENOUS | Status: DC | PRN
  Administered 2015-10-09: 150 mg via INTRAVENOUS

## 2015-10-09 MED ORDER — FENTANYL CITRATE (PF) 100 MCG/2ML IJ SOLN
INTRAMUSCULAR | Status: AC
Start: 2015-10-09 — End: 2015-10-09
  Filled 2015-10-09: qty 2

## 2015-10-09 MED ORDER — ARTIFICIAL TEARS OP OINT
TOPICAL_OINTMENT | OPHTHALMIC | Status: DC | PRN
  Administered 2015-10-09: 1 via OPHTHALMIC

## 2015-10-09 MED ORDER — MIDAZOLAM HCL 5 MG/5ML IJ SOLN
INTRAMUSCULAR | Status: DC | PRN
Start: 2015-10-09 — End: 2015-10-09
  Administered 2015-10-09: 2 mg via INTRAVENOUS

## 2015-10-09 MED ORDER — ACETAMINOPHEN 650 MG RE SUPP: 650.0000 mg | RECTAL | Status: DC | PRN

## 2015-10-09 MED ORDER — ALUM & MAG HYDROXIDE-SIMETH 200-200-20 MG/5ML PO SUSP
30.0000 mL | ORAL | Status: DC | PRN
  Administered 2015-10-10: 30 mL via ORAL
  Filled 2015-10-09: qty 30

## 2015-10-09 MED ORDER — LIDOCAINE HCL (CARDIAC) 20 MG/ML IV SOLN
INTRAVENOUS | Status: AC
  Filled 2015-10-09: qty 5

## 2015-10-09 MED ORDER — MIDAZOLAM HCL 2 MG/2ML IJ SOLN
INTRAMUSCULAR | Status: AC
  Filled 2015-10-09: qty 2

## 2015-10-09 MED ORDER — BUPIVACAINE HCL (PF) 0.25 % IJ SOLN
INTRAMUSCULAR | Status: DC | PRN
  Administered 2015-10-09: 2 mL

## 2015-10-09 MED ORDER — HEMOSTATIC AGENTS (NO CHARGE) OPTIME
TOPICAL | Status: DC | PRN
  Administered 2015-10-09: 1 via TOPICAL

## 2015-10-09 MED ORDER — MENTHOL 3 MG MT LOZG: 1.0000 | LOZENGE | OROMUCOSAL | Status: DC | PRN

## 2015-10-09 MED ORDER — GABAPENTIN 300 MG PO CAPS
300.0000 mg | ORAL_CAPSULE | Freq: Three times a day (TID) | ORAL | Status: DC
  Administered 2015-10-09 (×2): 300 mg via ORAL
  Filled 2015-10-09 (×2): qty 1

## 2015-10-09 MED ORDER — 0.9 % SODIUM CHLORIDE (POUR BTL) OPTIME
TOPICAL | Status: DC | PRN
  Administered 2015-10-09: 1000 mL

## 2015-10-09 MED ORDER — SODIUM CHLORIDE 0.9% FLUSH
3.0000 mL | Freq: Two times a day (BID) | INTRAVENOUS | Status: DC
  Administered 2015-10-09: 3 mL via INTRAVENOUS

## 2015-10-09 MED ORDER — PROPOFOL 10 MG/ML IV BOLUS
INTRAVENOUS | Status: AC
  Filled 2015-10-09: qty 20

## 2015-10-09 MED ORDER — THROMBIN 5000 UNITS EX SOLR
OROMUCOSAL | Status: DC | PRN
  Administered 2015-10-09: 09:00:00 via TOPICAL

## 2015-10-09 MED ORDER — FENTANYL CITRATE (PF) 100 MCG/2ML IJ SOLN
INTRAMUSCULAR | Status: DC | PRN
  Administered 2015-10-09: 50 ug via INTRAVENOUS
  Administered 2015-10-09: 100 ug via INTRAVENOUS

## 2015-10-09 SURGICAL SUPPLY — 46 items
APL SKNCLS STERI-STRIP NONHPOA (GAUZE/BANDAGES/DRESSINGS) ×1
BAG DECANTER FOR FLEXI CONT (MISCELLANEOUS) ×2 IMPLANT
BENZOIN TINCTURE PRP APPL 2/3 (GAUZE/BANDAGES/DRESSINGS) ×2 IMPLANT
BUR MATCHSTICK NEURO 3.0 LAGG (BURR) ×2 IMPLANT
CANISTER SUCT 3000ML PPV (MISCELLANEOUS) ×2 IMPLANT
DERMABOND ADVANCED (GAUZE/BANDAGES/DRESSINGS) ×1
DERMABOND ADVANCED .7 DNX12 (GAUZE/BANDAGES/DRESSINGS) ×1 IMPLANT
DRAPE LAPAROTOMY 100X72X124 (DRAPES) ×2 IMPLANT
DRAPE MICROSCOPE LEICA (MISCELLANEOUS) ×4 IMPLANT
DRAPE POUCH INSTRU U-SHP 10X18 (DRAPES) ×2 IMPLANT
DRAPE SURG 17X23 STRL (DRAPES) ×2 IMPLANT
DURAPREP 26ML APPLICATOR (WOUND CARE) ×2 IMPLANT
ELECT REM PT RETURN 9FT ADLT (ELECTROSURGICAL) ×2
ELECTRODE REM PT RTRN 9FT ADLT (ELECTROSURGICAL) ×1 IMPLANT
GAUZE SPONGE 4X4 16PLY XRAY LF (GAUZE/BANDAGES/DRESSINGS) IMPLANT
GLOVE BIO SURGEON STRL SZ8 (GLOVE) ×4 IMPLANT
GLOVE BIOGEL PI IND STRL 6.5 (GLOVE) ×3 IMPLANT
GLOVE BIOGEL PI IND STRL 7.0 (GLOVE) ×2 IMPLANT
GLOVE BIOGEL PI IND STRL 7.5 (GLOVE) ×1 IMPLANT
GLOVE BIOGEL PI INDICATOR 6.5 (GLOVE) ×3
GLOVE BIOGEL PI INDICATOR 7.0 (GLOVE) ×2
GLOVE BIOGEL PI INDICATOR 7.5 (GLOVE) ×1
GLOVE ECLIPSE 6.5 STRL STRAW (GLOVE) ×2 IMPLANT
GOWN STRL REUS W/ TWL LRG LVL3 (GOWN DISPOSABLE) ×2 IMPLANT
GOWN STRL REUS W/ TWL XL LVL3 (GOWN DISPOSABLE) ×2 IMPLANT
GOWN STRL REUS W/TWL 2XL LVL3 (GOWN DISPOSABLE) IMPLANT
GOWN STRL REUS W/TWL LRG LVL3 (GOWN DISPOSABLE) ×4
GOWN STRL REUS W/TWL XL LVL3 (GOWN DISPOSABLE) ×4
HEMOSTAT POWDER KIT SURGIFOAM (HEMOSTASIS) ×2 IMPLANT
KIT BASIN OR (CUSTOM PROCEDURE TRAY) ×2 IMPLANT
KIT ROOM TURNOVER OR (KITS) ×2 IMPLANT
NEEDLE HYPO 25X1 1.5 SAFETY (NEEDLE) ×2 IMPLANT
NEEDLE SPNL 20GX3.5 QUINCKE YW (NEEDLE) ×2 IMPLANT
NS IRRIG 1000ML POUR BTL (IV SOLUTION) ×2 IMPLANT
PACK LAMINECTOMY NEURO (CUSTOM PROCEDURE TRAY) ×2 IMPLANT
PAD ARMBOARD 7.5X6 YLW CONV (MISCELLANEOUS) ×8 IMPLANT
RUBBERBAND STERILE (MISCELLANEOUS) ×8 IMPLANT
SPONGE SURGIFOAM ABS GEL SZ50 (HEMOSTASIS) ×2 IMPLANT
STRIP CLOSURE SKIN 1/2X4 (GAUZE/BANDAGES/DRESSINGS) ×2 IMPLANT
SUT VIC AB 0 CT1 18XCR BRD8 (SUTURE) ×1 IMPLANT
SUT VIC AB 0 CT1 8-18 (SUTURE) ×2
SUT VIC AB 2-0 CP2 18 (SUTURE) ×2 IMPLANT
SUT VIC AB 3-0 SH 8-18 (SUTURE) ×2 IMPLANT
TOWEL OR 17X24 6PK STRL BLUE (TOWEL DISPOSABLE) ×2 IMPLANT
TOWEL OR 17X26 10 PK STRL BLUE (TOWEL DISPOSABLE) ×2 IMPLANT
WATER STERILE IRR 1000ML POUR (IV SOLUTION) ×2 IMPLANT

## 2015-10-09 NOTE — Progress Notes (Signed)
Pharmacy Antibiotic Note  Karen Watson is a 75 y.o. female admitted on 10/09/2015 for a planned Right L5-S1 extraforaminal decompression with resection of synovial cyst and decompression of the L5 nerve root.  Pharmacy has been consulted for Vancomycin dosing post-op for surgical prophylaxis. Since the patient does not have a drain in place (per discussion with RN) - only one dose will be required post-op.  Vanc 1g x 1 dose given at [place] today.  Plan: 1. Vancomycin 1g IV x 1 dose at 2030 this evening 2. Pharmacy will sign off of consult as no further doses required at this time.     Temp (24hrs), Avg:97.4 F (36.3 C), Min:96.8 F (36 C), Max:98 F (36.7 C)   Recent Labs Lab 10/03/15 1002  WBC 6.1  CREATININE 1.16*    Estimated Creatinine Clearance: 43.5 mL/min (by C-G formula based on Cr of 1.16).    Allergies  Allergen Reactions  . Carbocaine [Mepivacaine Hcl] Hives and Nausea And Vomiting  . Codeine Anaphylaxis, Shortness Of Breath, Nausea And Vomiting and Other (See Comments)    Respiratory distress; Low blood pressure. Pt tolerates percocet.  . Hydrocodone-Homatropine Shortness Of Breath, Nausea And Vomiting and Other (See Comments)    Dangerously low blood pressure, respiratory distress; heaviness in chest   . Hydrocodone-Homatropine Anaphylaxis, Nausea And Vomiting and Shortness Of Breath    Dangerously low blood pressure, respiratory distress; heaviness in chest  . Mepivacaine Hives and Nausea And Vomiting  . Metrizamide Hives    Pre-medicated with Benadryl '50mg'$  PO  . Other Other (See Comments)    Sentura, heavy cramping diarrhea, HA "15-20 yr ago; don't remember what it was used for" (06/08/2012)  . Propoxyphene N-Acetaminophen Nausea And Vomiting and Other (See Comments)    "Darvocet" t; Lightheadedness, confusion  . Robaxin [Methocarbamol] Other (See Comments)    "severe Altzheimer's symptoms"  . Tape Rash and Other (See Comments)    Surgical tape causes  rash and big blisters  . Tramadol Other (See Comments)    Low respiratory rate, nausea, chest heaviness  . Cefazolin Hives, Nausea And Vomiting and Other (See Comments)    "Ancef"; Also, severe stomach pain  . Iodinated Diagnostic Agents Hives    Pre-medicated with Benadryl '50mg'$  PO  . Penicillins Hives, Nausea And Vomiting and Other (See Comments)    Also, severe stomach pain  . Nsaids Other (See Comments)    Bruising; use with caution    Thank you for allowing pharmacy to be a part of this patient's care.  Alycia Rossetti, PharmD, BCPS Clinical Pharmacist Pager: [phone] 10/09/2015 12:24 PM

## 2015-10-09 NOTE — Op Note (Signed)
10/09/2015  9:59 AM  PATIENT:  Karen Watson  75 y.o. 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 Cyndy Freeze  ANESTHESIA:   General  EBL: Less than 25 ml  Total I/O In: 1000 [I.V.:1000] Out: -   BLOOD ADMINISTERED:none  DRAINS: None   SPECIMEN:  No Specimen  INDICATION FOR PROCEDURE: This patient presented with severe right leg pain seem to follow an L5 distribution. She had a early partial foot drop on the right. Her pain was progressive and severe. She responded briefly to a right L5 nerve root block but did not respond to epidural steroid injections at L4-5 or L5-S1. MRI showed a small synovial cyst within the foramen at L5-S1 on the right. I recommended resection of the synovial cyst. Patient understood the risks, benefits, and alternatives and potential outcomes and wished to proceed.  PROCEDURE DETAILS: The patient was taken to the operating room and after induction of adequate generalized endotracheal anesthesia, the patient was rolled into the prone position on the Wilson frame and all pressure points were padded. The lumbar region was cleaned and then prepped with DuraPrep and draped in the usual sterile fashion. 5 cc of local anesthesia was injected and then a dorsal midline incision was made and carried down to the lumbo sacral fascia. The fascia was opened and the paraspinous musculature was taken down in a subperiosteal fashion to expose L5-S1 on the right. Intraoperative x-ray confirmed my level, and then I used a combination of the high-speed drill to drill away the lateral part of the pars and the superior part of the facet at L5-S1 on the right. While drilling the facet there was a brief release  of clear fluid from the facet joint. The underlying ligament was opened and removed in a piecemeal fashion to expose the exiting nerve root. I undercut the lateral recess and dissected down until I was medial to and distal to the pedicle. There appeared to be a small deflated synovial cyst overlying the lateral part of the L5 nerve root. This was removed. The nerve root was identified. The nerve root was well decompressed. I repeated the x-ray to make sure I was in the correct spot. I then palpated with a coronary dilator along the nerve root and into the foramen to assure adequate decompression. I felt no more compression of the nerve root. I irrigated with saline solution containing bacitracin. Achieved hemostasis with bipolar cautery, lined the dura with Gelfoam, and then closed the fascia with 0 Vicryl. I closed the subcutaneous tissues with 2-0 Vicryl and the subcuticular tissues with 3-0 Vicryl. The skin was then closed with Dermabond. The drapes were removed.The patient was awakened from general anesthesia and transferred to the recovery room in stable condition. At the end of the procedure all sponge, needle and instrument counts were correct.   PLAN OF CARE: Admit for overnight observation  PATIENT DISPOSITION:  PACU - hemodynamically stable.   Delay start of Pharmacological VTE agent (>24hrs) due to surgical blood loss or risk of bleeding:  yes

## 2015-10-09 NOTE — Transfer of Care (Signed)
Immediate Anesthesia Transfer of Care Note  Patient: Karen Watson  Procedure(s) Performed: Procedure(s): Right Lumbar Five-Sacral One Extraforaminal Foraminotomy with removal of synovial cyst (Right)  Patient Location: PACU  Anesthesia Type:General  Level of Consciousness: awake, alert , oriented and patient cooperative  Airway & Oxygen Therapy: Patient Spontanous Breathing and Patient connected to nasal cannula oxygen  Post-op Assessment: Report given to RN, Post -op Vital signs reviewed and stable and Patient moving all extremities X 4  Post vital signs: Reviewed and stable  Last Vitals:  Filed Vitals:   10/09/15 0655 10/09/15 1009  BP: 132/78   Pulse: 63   Temp: 36.5 C 36.2 C  Resp: 20     Complications: No apparent anesthesia complications

## 2015-10-09 NOTE — H&P (Signed)
Subjective: Patient is a 75 y.o. female admitted for R leg pain. Onset of symptoms was several months ago, gradually worsening since that time.  The pain is rated severe, and is located at the across the lower back and radiates to R leg. The pain is described as aching and occurs all day. The symptoms have been progressive. Symptoms are exacerbated by exercise. MRI or CT showed synovial cyst in foramen on R L5-S1   Past Medical History  Diagnosis Date  . Chronic neck pain     Dr. Sherley Bounds  . Low back pain   . Memory loss   . Tumor of lung 1999    carcinoid; RML see Dr. Halford Decamp in [place]; "inactive since 1999" (06/08/2012)  . Restless leg     takes tonic water at bedtime  . Insomnia   . Family history of anesthesia complication     daughter has problems with anesthesia   . Hypertension   . Complication of anesthesia 01/2003    developed Hives, vomiting, SOB, chest heaviness after gallbladder surgery was told they believe it was due to either ancef or carbocaine  . Chronic bronchitis ([place])     "spring and fall q yr" (06/08/2012)  . GMWNUUVO(536.6)     Dr. Erling Cruz; "frequent; w/weather changes" (06/08/2012)  . DJD (degenerative joint disease)     see Dr. Charlestine Night  . Arthritis     "thumbs, neck, shoulders, lower back" (06/08/2012)  . Basal cell carcinoma 1995-present    "face and back" (06/08/2012)  . Lung cancer, lower lobe ([place]) 08/1997    "lower right lobe" (06/08/2012)  . Lung cancer, middle lobe ([place]) 06/1997    "right; inactive since 1999" (06/08/2012)  . PONV (postoperative nausea and vomiting)   . Chronic kidney disease     states she is stage 3     Past Surgical History  Procedure Laterality Date  . Dexa  01/2009    normal  . Tubal ligation  ~ 1976  . Colonscopy  08-03-13    per Dr. Hilarie Fredrickson, clear, no repeats needed   . Tonsillectomy  1947  . Basal cell carcinoma excision  1995-2011    "off back; @ least a couple/yr; Dr. Tonia Brooms" (06/08/2012)  . Lung lobectomy  08/1997     RLL "for carcinoid" (06/08/2012)  . Cholecystectomy  01/2003  . Cataract extraction w/ intraocular lens  implant, bilateral  2011    per Dr. Ayesha Rumpf in [place]  . Dilation and curettage of uterus  2000's  . Anterior lat lumbar fusion  06/08/2012    L2-3; L3-4  . Anterior lat lumbar fusion  06/08/2012    Procedure: ANTERIOR LATERAL LUMBAR FUSION 2 LEVELS;  Surgeon: Eustace Moore, MD;  Location: [facility];  Service: Neurosurgery;  Laterality: Right;  Right Lumbar two-three, lumbar three-four extreme lateral interbody fusion with percutaneous pedicle screws  . Lumbar percutaneous pedicle screw 1 level  06/08/2012    Procedure: LUMBAR PERCUTANEOUS PEDICLE SCREW 1 LEVEL;  Surgeon: Eustace Moore, MD;  Location: [facility];  Service: Neurosurgery;  Laterality: Right;  Right Lumbar two-three, lumbar three-four extreme lateral interbody fusion with percutaneous pedicle screws  . Breast biopsy  07/2013    left breast    Prior to Admission medications   Medication Sig Start Date End Date Taking? Authorizing Provider  acetaminophen (TYLENOL) 500 MG tablet Take 1,000 mg by mouth 3 (three) times daily as needed. For pain   Yes Historical Provider, MD  amLODipine (NORVASC)  5 MG tablet Take 1 tablet (5 mg total) by mouth daily. 07/04/15  Yes Laurey Morale, MD  b complex vitamins tablet Take 1 tablet by mouth daily.     Yes Historical Provider, MD  calcium-vitamin D (OSCAL WITH D) 250-125 MG-UNIT per tablet Take 1 tablet by mouth daily.     Yes Historical Provider, MD  fish oil-omega-3 fatty acids 1000 MG capsule Take 1 g by mouth daily.    Yes Historical Provider, MD  gabapentin (NEURONTIN) 300 MG capsule Take 1 capsule by mouth 3  times daily 07/04/15  Yes Laurey Morale, MD  MAGNESIUM-ZINC PO Take 1 tablet by mouth every other day.    Yes Historical Provider, MD  meloxicam (MOBIC) 15 MG tablet Take 1 tablet by mouth  daily 07/04/15  Yes Laurey Morale, MD  MULTIPLE VITAMIN PO Take 1 tablet by mouth  daily.    Yes Historical Provider, MD  potassium chloride SA (K-DUR,KLOR-CON) 20 MEQ tablet Take 1 tablet by mouth  daily 05/09/15  Yes Laurey Morale, MD  triamterene-hydrochlorothiazide [facility]) 37.5-25 MG tablet Take 1 tablet by mouth  daily 07/04/15  Yes Laurey Morale, MD  vitamin C (ASCORBIC ACID) 500 MG tablet Take 500 mg by mouth daily.     Yes Historical Provider, MD  vitamin E 400 UNIT capsule Take 400 Units by mouth daily.     Yes Historical Provider, MD   Allergies  Allergen Reactions  . Carbocaine [Mepivacaine Hcl] Hives and Nausea And Vomiting  . Codeine Anaphylaxis, Shortness Of Breath, Nausea And Vomiting and Other (See Comments)    Respiratory distress; Low blood pressure. Pt tolerates percocet.  . Hydrocodone-Homatropine Shortness Of Breath, Nausea And Vomiting and Other (See Comments)    Dangerously low blood pressure, respiratory distress; heaviness in chest   . Hydrocodone-Homatropine Anaphylaxis, Nausea And Vomiting and Shortness Of Breath    Dangerously low blood pressure, respiratory distress; heaviness in chest  . Mepivacaine Hives and Nausea And Vomiting  . Metrizamide Hives    Pre-medicated with Benadryl '50mg'$  PO  . Other Other (See Comments)    Sentura, heavy cramping diarrhea, HA "15-20 yr ago; don't remember what it was used for" (06/08/2012)  . Propoxyphene N-Acetaminophen Nausea And Vomiting and Other (See Comments)    "Darvocet" t; Lightheadedness, confusion  . Robaxin [Methocarbamol] Other (See Comments)    "severe Altzheimer's symptoms"  . Tape Rash and Other (See Comments)    Surgical tape causes rash and big blisters  . Tramadol Other (See Comments)    Low respiratory rate, nausea, chest heaviness  . Cefazolin Hives, Nausea And Vomiting and Other (See Comments)    "Ancef"; Also, severe stomach pain  . Iodinated Diagnostic Agents Hives    Pre-medicated with Benadryl '50mg'$  PO  . Penicillins Hives, Nausea And Vomiting and Other (See Comments)     Also, severe stomach pain  . Nsaids Other (See Comments)    Bruising; use with caution    Social History  Substance Use Topics  . Smoking status: Never Smoker   . Smokeless tobacco: Never Used  . Alcohol Use: No    Family History  Problem Relation Age of Onset  . Multiple sclerosis Brother   . Asthma    . Coronary artery disease    . Diabetes    . Hypertension    . Melanoma    . Osteoporosis    . Uterine cancer    . Kidney cancer    . Colon  cancer Neg Hx      Review of Systems  Positive ROS: neg  All other systems have been reviewed and were otherwise negative with the exception of those mentioned in the HPI and as above.  Objective: Vital signs in last 24 hours:    General Appearance: Alert, cooperative, no distress, appears stated age Head: Normocephalic, without obvious abnormality, atraumatic Eyes: PERRL, conjunctiva/corneas clear, EOM's intact    Neck: Supple, symmetrical, trachea midline Back: Symmetric, no curvature, ROM normal, no CVA tenderness Lungs:  respirations unlabored Heart: Regular rate and rhythm Abdomen: Soft, non-tender Extremities: Extremities normal, atraumatic, no cyanosis or edema Pulses: 2+ and symmetric all extremities Skin: Skin color, texture, turgor normal, no rashes or lesions  NEUROLOGIC:   Mental status: Alert and oriented x4,  no aphasia, good attention span, fund of knowledge, and memory Motor Exam - grossly normal except some weakness R DF Sensory Exam - grossly normal Reflexes: 1+ Coordination - grossly normal Gait - grossly normal Balance - grossly normal Cranial Nerves: I: smell Not tested  II: visual acuity  OS: nl    OD: nl  II: visual fields Full to confrontation  II: pupils Equal, round, reactive to light  III,VII: ptosis None  III,IV,VI: extraocular muscles  Full ROM  V: mastication Normal  V: facial light touch sensation  Normal  V,VII: corneal reflex  Present  VII: facial muscle function - upper  Normal   VII: facial muscle function - lower Normal  VIII: hearing Not tested  IX: soft palate elevation  Normal  IX,X: gag reflex Present  XI: trapezius strength  5/5  XI: sternocleidomastoid strength 5/5  XI: neck flexion strength  5/5  XII: tongue strength  Normal    Data Review Lab Results  Component Value Date   WBC 6.1 10/03/2015   HGB 12.9 10/03/2015   HCT 40.5 10/03/2015   MCV 96.0 10/03/2015   PLT 209 10/03/2015   Lab Results  Component Value Date   NA 140 10/03/2015   K 3.7 10/03/2015   CL 102 10/03/2015   CO2 27 10/03/2015   BUN 24* 10/03/2015   CREATININE 1.16* 10/03/2015   GLUCOSE 112* 10/03/2015   Lab Results  Component Value Date   INR 1.01 10/03/2015    Assessment/Plan: Patient admitted for R L5-S1 decompression for cyst. Patient has failed a reasonable attempt at conservative therapy.  I explained the condition and procedure to the patient and answered any questions.  Patient wishes to proceed with procedure as planned. Understands risks/ benefits and typical outcomes of procedure.   JONES,DAVID S 10/09/2015 6:43 AM

## 2015-10-09 NOTE — Anesthesia Postprocedure Evaluation (Signed)
Anesthesia Post Note  Patient: Karen Watson  Procedure(s) Performed: Procedure(s) (LRB): Right Lumbar Five-Sacral One Extraforaminal Foraminotomy with removal of synovial cyst (Right)  Patient location during evaluation: PACU Anesthesia Type: General Level of consciousness: awake and alert Pain management: pain level controlled Vital Signs Assessment: post-procedure vital signs reviewed and stable Respiratory status: spontaneous breathing, nonlabored ventilation, respiratory function stable and patient connected to nasal cannula oxygen Cardiovascular status: blood pressure returned to baseline and stable Postop Assessment: no signs of nausea or vomiting Anesthetic complications: no    Last Vitals:  Filed Vitals:   10/09/15 1155 10/09/15 1624  BP: 94/52 98/48  Pulse: 67 68  Temp: 36.7 C 36.7 C  Resp: 18 18    Last Pain:  Filed Vitals:   10/09/15 1625  PainSc: [name]

## 2015-10-10 ENCOUNTER — Encounter (HOSPITAL_COMMUNITY): Payer: Self-pay | Admitting: Neurological Surgery

## 2015-10-10 DIAGNOSIS — Z981 Arthrodesis status: Secondary | ICD-10-CM | POA: Diagnosis not present

## 2015-10-10 DIAGNOSIS — Z85110 Personal history of malignant carcinoid tumor of bronchus and lung: Secondary | ICD-10-CM | POA: Diagnosis not present

## 2015-10-10 DIAGNOSIS — Z791 Long term (current) use of non-steroidal anti-inflammatories (NSAID): Secondary | ICD-10-CM | POA: Diagnosis not present

## 2015-10-10 DIAGNOSIS — M159 Polyosteoarthritis, unspecified: Secondary | ICD-10-CM | POA: Diagnosis not present

## 2015-10-10 DIAGNOSIS — N183 Chronic kidney disease, stage 3 (moderate): Secondary | ICD-10-CM | POA: Diagnosis not present

## 2015-10-10 DIAGNOSIS — M5416 Radiculopathy, lumbar region: Secondary | ICD-10-CM | POA: Diagnosis not present

## 2015-10-10 DIAGNOSIS — M21371 Foot drop, right foot: Secondary | ICD-10-CM | POA: Diagnosis not present

## 2015-10-10 DIAGNOSIS — I129 Hypertensive chronic kidney disease with stage 1 through stage 4 chronic kidney disease, or unspecified chronic kidney disease: Secondary | ICD-10-CM | POA: Diagnosis not present

## 2015-10-10 DIAGNOSIS — M7138 Other bursal cyst, other site: Secondary | ICD-10-CM | POA: Diagnosis not present

## 2015-10-10 DIAGNOSIS — Z79899 Other long term (current) drug therapy: Secondary | ICD-10-CM | POA: Diagnosis not present

## 2015-10-10 MED ORDER — OXYCODONE-ACETAMINOPHEN 5-325 MG PO TABS: 1.0000 | ORAL_TABLET | Freq: Four times a day (QID) | ORAL | Status: DC | PRN

## 2015-10-10 NOTE — Discharge Instructions (Signed)

## 2015-10-10 NOTE — Progress Notes (Signed)
Pt and husband given D/C instructions with Rx, verbal understanding was provided. Pt's incision is clean and dry with no sign of infection. Pt's IV was removed prior to D/C. Pt D/C'd home via wheelchair @ 1110 per MD order. Pt is stable @ D/C and has no other needs at this time. Holli Humbles, RN

## 2015-10-10 NOTE — Discharge Summary (Signed)
Physician Discharge Summary  Patient ID: Karen Watson MRN: [id number] DOB/AGE: 07-16-1940 75 y.o.  Admit date: 10/09/2015 Discharge date: 10/10/2015  Admission Diagnoses: Synovial cyst L5-S1 right with right leg pain    Discharge Diagnoses: Same   Discharged Condition: good  Hospital Course: The patient was admitted on 10/09/2015 and taken to the operating room where the patient underwent extraforaminal decompression and removal of synovial cyst L5-S1 right. The patient tolerated the procedure well and was taken to the recovery room and then to the floor in stable condition. The hospital course was routine. There were no complications. The wound remained clean dry and intact. Pt had appropriate back soreness. No complaints of leg pain or new N/T/W. The patient remained afebrile with stable vital signs, and tolerated a regular diet. The patient continued to increase activities, and pain was well controlled with oral pain medications.   Consults: None  Significant Diagnostic Studies:  Results for orders placed or performed during the hospital encounter of 10/03/15  Surgical pcr screen  Result Value Ref Range   MRSA, PCR NEGATIVE NEGATIVE   Staphylococcus aureus NEGATIVE NEGATIVE  Basic metabolic panel  Result Value Ref Range   Sodium 140 135 - 145 mmol/L   Potassium 3.7 3.5 - 5.1 mmol/L   Chloride 102 101 - 111 mmol/L   CO2 27 22 - 32 mmol/L   Glucose, Bld 112 (H) 65 - 99 mg/dL   BUN 24 (H) 6 - 20 mg/dL   Creatinine, Ser 1.16 (H) 0.44 - 1.00 mg/dL   Calcium 9.9 8.9 - 10.3 mg/dL   GFR calc non Af Amer 46 (L) >60 mL/min   GFR calc Af Amer 53 (L) >60 mL/min   Anion gap 11 5 - 15  CBC WITH DIFFERENTIAL  Result Value Ref Range   WBC 6.1 4.0 - 10.5 K/uL   RBC 4.22 3.87 - 5.11 MIL/uL   Hemoglobin 12.9 12.0 - 15.0 g/dL   HCT 40.5 36.0 - 46.0 %   MCV 96.0 78.0 - 100.0 fL   MCH 30.6 26.0 - 34.0 pg   MCHC 31.9 30.0 - 36.0 g/dL   RDW 14.4 11.5 - 15.5 %   Platelets 209 150 - 400  K/uL   Neutrophils Relative % 56 %   Neutro Abs 3.4 1.7 - 7.7 K/uL   Lymphocytes Relative 33 %   Lymphs Abs 2.0 0.7 - 4.0 K/uL   Monocytes Relative 7 %   Monocytes Absolute 0.4 0.1 - 1.0 K/uL   Eosinophils Relative 3 %   Eosinophils Absolute 0.2 0.0 - 0.7 K/uL   Basophils Relative 1 %   Basophils Absolute 0.0 0.0 - 0.1 K/uL  Protime-INR  Result Value Ref Range   Prothrombin Time 13.5 11.6 - 15.2 seconds   INR 1.01 0.00 - 1.49    Dg Chest 2 View  10/09/2015  CLINICAL DATA:  Preoperative imaging for cyst removal from spine today. History of lung cancer and hypertension. EXAM: CHEST  2 VIEW COMPARISON:  10/03/2015 FINDINGS: Normal heart size and pulmonary vascularity. Hyperinflation in the lungs consistent with emphysema. Focal nodule demonstrated in the right lower lung is unchanged since prior study. Surgical clips in the right axilla. No focal consolidation or airspace disease. No blunting of costophrenic angles. No pneumothorax. Surgical clips in the right upper quadrant. IMPRESSION: Emphysematous changes in the lungs. Right lower lung nodule without change. No evidence of active pulmonary disease. Electronically Signed   By: Lucienne Capers M.D.   On: 10/09/2015 06:56  Chest 2 View  10/03/2015  CLINICAL DATA:  For L5-S1 foraminotomy, stage 3 kidney disease EXAM: CHEST  2 VIEW COMPARISON:  CT chest of 06/07/2012 and chest x-ray of 06/02/2012 FINDINGS: The noncalcified rounded nodule in the right lung base is unchanged. There is a vague opacity within the right lung base slightly more cephalad and lateral in position of questionable significance. No definite pneumonia or effusion is seen. There is some peribronchial thickening which may indicate bronchitis. Mediastinal and hilar contours are unremarkable. Cardiomegaly is stable. Surgical clips overlie the right infrahilar region. No bony abnormality is seen. IMPRESSION: 1. Stable circumscribed nodule in the right lower lobe. 2. Vague opacity  within the right lung base as well more cephalad and lateral. Recommend followup chest x-ray to exclude developing pneumonia or nodule. 3. Peribronchial thickening consistent with bronchitis. Electronically Signed   By: Ivar Drape M.D.   On: 10/03/2015 11:03   Dg Lumbar Spine 2-3 Views  10/09/2015  CLINICAL DATA:  Surgery: Right Lumbar Five-Sacral One Extraforaminal Foraminotomy with removal of synovial cyst (Localization) EXAM: LUMBAR SPINE - 2-3 VIEW COMPARISON:  Radiograph 06/10/2015 FINDINGS: Three lateral intraoperative views of lumbar spine provided. Initial image demonstrates a sharp tip probe posterior to the S1 vertebral body. Second image demonstrates skin spreaders and probes at L5. Third image demonstrates a sharp tip probe at the L5 vertebral body level. Posterior fusion from L2-L4. IMPRESSION: Intraoperative views as above. Electronically Signed   By: Suzy Bouchard M.D.   On: 10/09/2015 10:42    Antibiotics:  Anti-infectives    Start     Dose/Rate Route Frequency Ordered Stop   10/09/15 2030  vancomycin (VANCOCIN) IVPB 1000 mg/200 mL premix     1,000 mg 200 mL/hr over 60 Minutes Intravenous  Once 10/09/15 1224 10/09/15 2239   10/09/15 0924  bacitracin 50,000 Units in sodium chloride irrigation 0.9 % 500 mL irrigation  Status:  Discontinued       As needed 10/09/15 0924 10/09/15 1004   10/09/15 0600  vancomycin (VANCOCIN) IVPB 1000 mg/200 mL premix     1,000 mg 200 mL/hr over 60 Minutes Intravenous To [facility] 10/08/15 1305 10/09/15 0825      Discharge Exam: Blood pressure 115/52, pulse 65, temperature 98.5 F (36.9 C), temperature source Oral, resp. rate 20, SpO2 98 %. Neurologic: Grossly normal Incision clean dry and intact  Discharge Medications:     Medication List    TAKE these medications        acetaminophen 500 MG tablet  Commonly known as:  TYLENOL  Take 1,000 mg by mouth 3 (three) times daily as needed. For pain     amLODipine 5 MG tablet   Commonly known as:  NORVASC  Take 1 tablet (5 mg total) by mouth daily.     b complex vitamins tablet  Take 1 tablet by mouth daily.     calcium-vitamin D 250-125 MG-UNIT tablet  Commonly known as:  OSCAL WITH D  Take 1 tablet by mouth daily.     fish oil-omega-3 fatty acids 1000 MG capsule  Take 1 g by mouth daily.     gabapentin 300 MG capsule  Commonly known as:  NEURONTIN  Take 1 capsule by mouth 3  times daily     MAGNESIUM-ZINC PO  Take 1 tablet by mouth every other day.     meloxicam 15 MG tablet  Commonly known as:  MOBIC  Take 1 tablet by mouth  daily     MULTIPLE  VITAMIN PO  Take 1 tablet by mouth daily.     oxyCODONE-acetaminophen 5-325 MG tablet  Commonly known as:  PERCOCET/ROXICET  Take 1-2 tablets by mouth every 6 (six) hours as needed for moderate pain.     potassium chloride SA 20 MEQ tablet  Commonly known as:  K-DUR,KLOR-CON  Take 1 tablet by mouth  daily     triamterene-hydrochlorothiazide 37.5-25 MG tablet  Commonly known as:  MAXZIDE-25  Take 1 tablet by mouth  daily     vitamin C 500 MG tablet  Commonly known as:  ASCORBIC ACID  Take 500 mg by mouth daily.     vitamin E 400 UNIT capsule  Take 400 Units by mouth daily.        Disposition: Home   Final Dx: Right L5-S1 extraforaminal decompression and removal of synovial cyst      Discharge Instructions    Call MD for:  difficulty breathing, headache or visual disturbances    Complete by:  As directed      Call MD for:  persistant nausea and vomiting    Complete by:  As directed      Call MD for:  redness, tenderness, or signs of infection (pain, swelling, redness, odor or green/yellow discharge around incision site)    Complete by:  As directed      Call MD for:  severe uncontrolled pain    Complete by:  As directed      Call MD for:  temperature >100.4    Complete by:  As directed      Diet - low sodium heart healthy    Complete by:  As directed      Discharge instructions     Complete by:  As directed   Strenuous activity, may shower, no bending or twisting     Increase activity slowly    Complete by:  As directed               Signed: Satvik Parco S 10/10/2015, 9:10 AM

## 2015-10-23 DIAGNOSIS — M25561 Pain in right knee: Secondary | ICD-10-CM | POA: Diagnosis not present

## 2015-11-06 DIAGNOSIS — D3A00 Benign carcinoid tumor of unspecified site: Secondary | ICD-10-CM | POA: Diagnosis not present

## 2015-11-07 DIAGNOSIS — M5416 Radiculopathy, lumbar region: Secondary | ICD-10-CM | POA: Diagnosis not present

## 2015-11-07 DIAGNOSIS — S83281A Other tear of lateral meniscus, current injury, right knee, initial encounter: Secondary | ICD-10-CM | POA: Diagnosis not present

## 2015-11-21 DIAGNOSIS — S83281D Other tear of lateral meniscus, current injury, right knee, subsequent encounter: Secondary | ICD-10-CM | POA: Diagnosis not present

## 2015-11-26 DIAGNOSIS — M25561 Pain in right knee: Secondary | ICD-10-CM | POA: Diagnosis not present

## 2015-12-02 DIAGNOSIS — M25561 Pain in right knee: Secondary | ICD-10-CM | POA: Diagnosis not present

## 2016-01-07 DIAGNOSIS — M25551 Pain in right hip: Secondary | ICD-10-CM | POA: Diagnosis not present

## 2016-01-07 DIAGNOSIS — M1611 Unilateral primary osteoarthritis, right hip: Secondary | ICD-10-CM | POA: Diagnosis present

## 2016-01-07 NOTE — H&P (Signed)
PREOPERATIVE H&P Patient ID: Karen Watson MRN: [id number] DOB/AGE: 20-Feb-1941 14 y.o.  Chief Complaint: OA RIGHT HIP  Planned Procedure Date: 01/28/16  Medical and Cardiac Clearance by Dr. Sarajane Jews   Additional clearance by: Nephrology: Dr. Justin Mend  Oncology: Dr. Wendee Beavers  HPI: Karen Watson is a 75 y.o. female with a history of Carcinoid w/ R Lower Lobectomy, HLD, Depression, chronic LBP w/ surgery in 05/2012 and 09/2015 who presents for evaluation of OA RIGHT HIP. The patient has a history of pain and functional disability in the right hip due to arthritis and has failed non-surgical conservative treatments for greater than 12 weeks to include NSAID's and/or analgesics, corticosteriod injections, use of assistive devices and activity modification.  Onset of symptoms was gradual, starting 1 years ago with gradually worsening course since that time.  Patient currently rates pain at 8 out of 10 with activity. Patient has night pain, worsening of pain with activity and weight bearing, pain that interferes with activities of daily living and pain with passive range of motion. AP pelvis and lateral x-ray of the right hip reveals severe end stage osteoarthritis.  There is no active infection.  Past Medical History  Diagnosis Date  . Chronic neck pain     Dr. Sherley Bounds  . Low back pain   . Memory loss   . Tumor of lung 1999    carcinoid; RML see Dr. Halford Decamp in [place]; "inactive since 1999" (06/08/2012)  . Restless leg     takes tonic water at bedtime  . Insomnia   . Family history of anesthesia complication     daughter has problems with anesthesia   . Hypertension   . Complication of anesthesia 01/2003    developed Hives, vomiting, SOB, chest heaviness after gallbladder surgery was told they believe it was due to either ancef or carbocaine  . Chronic bronchitis ([place])     "spring and fall q yr" (06/08/2012)  . NWGNFAOZ(308.6)     Dr. Erling Cruz; "frequent; w/weather changes" (06/08/2012)  . DJD  (degenerative joint disease)     see Dr. Charlestine Night  . Arthritis     "thumbs, neck, shoulders, lower back" (06/08/2012)  . Basal cell carcinoma 1995-present    "face and back" (06/08/2012)  . Lung cancer, lower lobe ([place]) 08/1997    "lower right lobe" (06/08/2012)  . Lung cancer, middle lobe ([place]) 06/1997    "right; inactive since 1999" (06/08/2012)  . PONV (postoperative nausea and vomiting)   . Chronic kidney disease     states she is stage 3    Past Surgical History  Procedure Laterality Date  . Dexa  01/2009    normal  . Tubal ligation  ~ 1976  . Colonscopy  08-03-13    per Dr. Hilarie Fredrickson, clear, no repeats needed   . Tonsillectomy  1947  . Basal cell carcinoma excision  1995-2011    "off back; @ least a couple/yr; Dr. Tonia Brooms" (06/08/2012)  . Lung lobectomy  08/1997    RLL "for carcinoid" (06/08/2012)  . Cholecystectomy  01/2003  . Cataract extraction w/ intraocular lens  implant, bilateral  2011    per Dr. Ayesha Rumpf in [place]  . Dilation and curettage of uterus  2000's  . Anterior lat lumbar fusion  06/08/2012    L2-3; L3-4  . Anterior lat lumbar fusion  06/08/2012    Procedure: ANTERIOR LATERAL LUMBAR FUSION 2 LEVELS;  Surgeon: Eustace Moore, MD;  Location: [facility];  Service: Neurosurgery;  Laterality: Right;  Right Lumbar two-three, lumbar three-four extreme lateral interbody fusion with percutaneous pedicle screws  . Lumbar percutaneous pedicle screw 1 level  06/08/2012    Procedure: LUMBAR PERCUTANEOUS PEDICLE SCREW 1 LEVEL;  Surgeon: Eustace Moore, MD;  Location: [place];  Service: Neurosurgery;  Laterality: Right;  Right Lumbar two-three, lumbar three-four extreme lateral interbody fusion with percutaneous pedicle screws  . Breast biopsy  07/2013    left breast  . Lumbar laminectomy/decompression microdiscectomy Right 10/09/2015    Procedure: Right Lumbar Five-Sacral One Extraforaminal Foraminotomy with removal of synovial cyst;  Surgeon: Eustace Moore, MD;  Location:  [facility];  Service: Neurosurgery;  Laterality: Right;   Allergies  Allergen Reactions  . Carbocaine [Mepivacaine Hcl] Hives and Nausea And Vomiting  . Codeine Anaphylaxis, Shortness Of Breath, Nausea And Vomiting and Other (See Comments)    Respiratory distress; Low blood pressure. Pt tolerates percocet.  . Hydrocodone-Homatropine Shortness Of Breath, Nausea And Vomiting and Other (See Comments)    Dangerously low blood pressure, respiratory distress; heaviness in chest   . Hydrocodone-Homatropine Anaphylaxis, Nausea And Vomiting and Shortness Of Breath    Dangerously low blood pressure, respiratory distress; heaviness in chest  . Mepivacaine Hives and Nausea And Vomiting  . Metrizamide Hives    Pre-medicated with Benadryl '50mg'$  PO  . Other Other (See Comments)    [place], heavy cramping diarrhea, HA "15-20 yr ago; don't remember what it was used for" (06/08/2012)  . Propoxyphene N-Acetaminophen Nausea And Vomiting and Other (See Comments)    "Darvocet" t; Lightheadedness, confusion  . Robaxin [Methocarbamol] Other (See Comments)    "severe Altzheimer's symptoms"  . Tape Rash and Other (See Comments)    Surgical tape causes rash and big blisters  . Tramadol Other (See Comments)    Low respiratory rate, nausea, chest heaviness  . Cefazolin Hives, Nausea And Vomiting and Other (See Comments)    "Ancef"; Also, severe stomach pain  . Iodinated Diagnostic Agents Hives    Pre-medicated with Benadryl '50mg'$  PO  . Penicillins Hives, Nausea And Vomiting and Other (See Comments)    Also, severe stomach pain  . Nsaids Other (See Comments)    Bruising; use with caution   Prior to Admission medications   Medication Sig Start Date End Date Taking? Authorizing Provider  acetaminophen (TYLENOL) 500 MG tablet Take 1,000 mg by mouth 3 (three) times daily as needed. For pain    Historical Provider, MD  amLODipine (NORVASC) 5 MG tablet Take 1 tablet (5 mg total) by mouth daily. 07/04/15   Laurey Morale, MD  b complex vitamins tablet Take 1 tablet by mouth daily.      Historical Provider, MD  calcium-vitamin D (OSCAL WITH D) 250-125 MG-UNIT per tablet Take 1 tablet by mouth daily.      Historical Provider, MD  fish oil-omega-3 fatty acids 1000 MG capsule Take 1 g by mouth daily.     Historical Provider, MD  gabapentin (NEURONTIN) 300 MG capsule Take 1 capsule by mouth 3  times daily 07/04/15   Laurey Morale, MD  MAGNESIUM-ZINC PO Take 1 tablet by mouth every other day.     Historical Provider, MD  meloxicam (MOBIC) 15 MG tablet Take 1 tablet by mouth  daily 07/04/15   Laurey Morale, MD  MULTIPLE VITAMIN PO Take 1 tablet by mouth daily.     Historical Provider, MD  oxyCODONE-acetaminophen (PERCOCET/ROXICET) 5-325 MG tablet Take 1-2 tablets by mouth every 6 (six) hours as  needed for moderate pain. 10/10/15   Eustace Moore, MD  potassium chloride SA (K-DUR,KLOR-CON) 20 MEQ tablet Take 1 tablet by mouth  daily 05/09/15   Laurey Morale, MD  triamterene-hydrochlorothiazide [facility]) 37.5-25 MG tablet Take 1 tablet by mouth  daily 07/04/15   Laurey Morale, MD  vitamin C (ASCORBIC ACID) 500 MG tablet Take 500 mg by mouth daily.      Historical Provider, MD  vitamin E 400 UNIT capsule Take 400 Units by mouth daily.      Historical Provider, MD   Social History   Social History  . Marital Status: Married    Spouse Name: N/A  . Number of Children: N/A  . Years of Education: N/A   Social History Main Topics  . Smoking status: Never Smoker   . Smokeless tobacco: Never Used  . Alcohol Use: No  . Drug Use: No  . Sexual Activity: Not Currently   Other Topics Concern  . Not on file   Social History Narrative   Married         Family History  Problem Relation Age of Onset  . Multiple sclerosis Brother   . Asthma    . Coronary artery disease    . Diabetes    . Hypertension    . Melanoma    . Osteoporosis    . Uterine cancer    . Kidney cancer    . Colon cancer Neg Hx     ROS:  Currently denies lightheadedness, dizziness, Fever, chills, CP, SOB.   No personal history of DVT, PE, MI, or CVA. No loose teeth or dentures. All other systems have been reviewed and were otherwise negative with the exception of those mentioned in the HPI and as above.  Objective: Vitals: Ht: 5'5" Wt: 151 Temp: 97.9 BP: 135/74 Pulse: 71 O2 94% on room air. Physical Exam: General: Alert, NAD. Trendelenberg Gait  HEENT: EOMI, Good Neck Extension  Pulm: No increased work of breathing.  Clear B/L A/P w/o crackle or wheeze.  CV: RRR, No m/g/r appreciated  GI: soft, NT, ND Neuro: Neuro grossly intact b/l upper/lower ext.  Sensation intact distally Skin: No lesions in the area of chief complaint MSK/Surgical Site: Right Hip Non tender over greater trochanter.  Pain with passive ROM.  Positive Stinchfield.  5/5 strength.  NVI.  Sensation intact distally.  Imaging Review AP pelvis and lateral x-ray of the right hip reveals severe end stage osteoarthritis.    Assessment: OA RIGHT HIP Principal Problem:   Primary osteoarthritis of right hip Active Problems:   Anxiety state   Depression   Essential hypertension   LOW BACK PAIN   Osteoporosis   S/P lumbar laminectomy   Plan: Plan for Procedure(s): RIGHT TOTAL HIP ARTHROPLASTY ANTERIOR APPROACH  The patient history, physical exam, clinical judgement of the provider and imaging are consistent with end stage degenerative joint disease and total joint arthroplasty is deemed medically necessary. The treatment options including medical management, injection therapy, and arthroplasty were discussed at length. The risks and benefits of Procedure(s): RIGHT TOTAL HIP ARTHROPLASTY ANTERIOR APPROACH were presented and reviewed.  The risks of nonoperative treatment, versus surgical intervention including but not limited to continued pain, aseptic loosening, stiffness, dislocation/subluxation, infection, bleeding, nerve injury, blood clots,  cardiopulmonary complications, morbidity, mortality, among others were discussed. The patient verbalizes understanding and wishes to proceed with the plan.  Patient is being admitted for inpatient treatment for surgery, pain control, PT, OT, prophylactic antibiotics, VTE prophylaxis,  progressive ambulation, ADL's and discharge planning.   Dental prophylaxis discussed and recommended for 2 years postoperatively.   The patient does meet the criteria for TXA which will be used perioperatively via IV.    ASA '325mg'$  will be used postoperatively for DVT prophylaxis in addition to SCDs, and early ambulation.    The patient is planning to be discharged home with home health services in care of her husband.  No Robaxin - confusion.  No Hydrocodone (Oxycodone okay per patient).  Will use Vancomycin dt. Ancef / PCN Allergy - Hives.  No Tape - rash/blisters.  Charna Elizabeth Martensen III,PA-C 01/07/2016 6:47 PM

## 2016-01-17 ENCOUNTER — Encounter (HOSPITAL_COMMUNITY): Payer: Self-pay

## 2016-01-17 ENCOUNTER — Encounter (HOSPITAL_COMMUNITY)
Admission: RE | Admit: 2016-01-17 | Discharge: 2016-01-17 | Disposition: A | Discharge status: Home / Self Care | Payer: Medicare Other | Source: Ambulatory Visit | Attending: Orthopedic Surgery | Admitting: Orthopedic Surgery

## 2016-01-17 DIAGNOSIS — M1611 Unilateral primary osteoarthritis, right hip: Secondary | ICD-10-CM | POA: Insufficient documentation

## 2016-01-17 DIAGNOSIS — Z0183 Encounter for blood typing: Secondary | ICD-10-CM | POA: Insufficient documentation

## 2016-01-17 DIAGNOSIS — Z01812 Encounter for preprocedural laboratory examination: Secondary | ICD-10-CM | POA: Diagnosis not present

## 2016-01-17 HISTORY — DX: Other nonspecific abnormal finding of lung field: R91.8

## 2016-01-17 HISTORY — DX: Weakness: R53.1

## 2016-01-17 HISTORY — DX: Effusion, unspecified joint: M25.40

## 2016-01-17 HISTORY — DX: Constipation, unspecified: K59.00

## 2016-01-17 HISTORY — DX: Nocturia: R35.1

## 2016-01-17 HISTORY — DX: Personal history of other infectious and parasitic diseases: Z86.19

## 2016-01-17 HISTORY — DX: Pneumonia, unspecified organism: J18.9

## 2016-01-17 HISTORY — DX: Pain in unspecified joint: M25.50

## 2016-01-17 HISTORY — DX: Dizziness and giddiness: R42

## 2016-01-17 HISTORY — DX: Diarrhea, unspecified: R19.7

## 2016-01-17 LAB — URINALYSIS, ROUTINE W REFLEX MICROSCOPIC
Bilirubin Urine: NEGATIVE
GLUCOSE, UA: NEGATIVE mg/dL
Hgb urine dipstick: NEGATIVE
KETONES UR: NEGATIVE mg/dL
LEUKOCYTES UA: NEGATIVE
Nitrite: NEGATIVE
PH: 7.5 (ref 5.0–8.0)
Protein, ur: NEGATIVE mg/dL
SPECIFIC GRAVITY, URINE: 1.022 (ref 1.005–1.030)

## 2016-01-17 LAB — CBC
HCT: 38.9 % (ref 36.0–46.0)
HEMOGLOBIN: 12.6 g/dL (ref 12.0–15.0)
MCH: 31.2 pg (ref 26.0–34.0)
MCHC: 32.4 g/dL (ref 30.0–36.0)
MCV: 96.3 fL (ref 78.0–100.0)
PLATELETS: 214 10*3/uL (ref 150–400)
RBC: 4.04 MIL/uL (ref 3.87–5.11)
RDW: 14.2 % (ref 11.5–15.5)
WBC: 6.2 10*3/uL (ref 4.0–10.5)

## 2016-01-17 LAB — BASIC METABOLIC PANEL
ANION GAP: 8 (ref 5–15)
BUN: 20 mg/dL (ref 6–20)
CO2: 28 mmol/L (ref 22–32)
Calcium: 10.2 mg/dL (ref 8.9–10.3)
Chloride: 105 mmol/L (ref 101–111)
Creatinine, Ser: 1.03 mg/dL — ABNORMAL HIGH (ref 0.44–1.00)
GFR calc non Af Amer: 53 mL/min — ABNORMAL LOW (ref >60)
GLUCOSE: 83 mg/dL (ref 65–99)
POTASSIUM: 4 mmol/L (ref 3.5–5.1)
Sodium: 141 mmol/L (ref 135–145)

## 2016-01-17 LAB — TYPE AND SCREEN
ABO/RH(D): A POS
Antibody Screen: NEGATIVE

## 2016-01-17 LAB — SURGICAL PCR SCREEN
MRSA, PCR: NEGATIVE
Staphylococcus aureus: NEGATIVE

## 2016-01-17 LAB — APTT: aPTT: 32 seconds (ref 24–36)

## 2016-01-17 LAB — PROTIME-INR
INR: 0.97
PROTHROMBIN TIME: 12.9 s (ref 11.4–15.2)

## 2016-01-17 MED ORDER — CHLORHEXIDINE GLUCONATE 4 % EX LIQD: 60.0000 mL | Freq: Once | CUTANEOUS | Status: DC

## 2016-01-17 NOTE — Pre-Procedure Instructions (Signed)
ALLESSANDRA BERNARDI  01/17/2016      [id number] [place], [place] - [address] [place] [address]Phone: [phone]: [phone] [place] [place]Phone: [phone]: [phone]   Your procedure is scheduled on Tues, Aug 8 @ 12:00 PM  [facility]  Call this number if you have problems the morning of surgery:  [phone]  Remember:  Do not eat food or drink liquids after midnight.  Take these medicines the morning of surgery with A SIP OF WATER Amlodipine(Norvasc) and Gabapentin(Neurontin)              Stop taking your Mobic,Vitamins,Herbal Medications. No Goody's,BC's,Aleve,Advil,Motrin,Ibuprofen,or Fish Oil.   Do not wear jewelry, make-up or nail polish.  Do not wear lotions, powders, or perfumes.    Do not shave 48 hours prior to surgery.    Do not bring valuables to the hospital.  [facility] responsible for any belongings or valuables.  Contacts, dentures or bridgework may not be worn into surgery.  Leave your suitcase in the car.  After surgery it may be brought to your room.  For patients admitted to the hospital, discharge time will be determined by your treatment team.  Patients discharged the day of surgery will not be allowed to drive home.    SToppenishPreparing for Surgery  Before surgery, you can play an important role.  Because skin is not sterile, your skin needs to be as free of germs as possible.  You can reduce the number of germs on you skin by washing with CHG (chlorahexidine gluconate) soap before surgery.  CHG is an antiseptic cleaner which kills germs and bonds with the skin to continue killing germs even after washing.  Please DO NOT use if you have an allergy to CHG or antibacterial soaps.  If your skin becomes reddened/irritated  stop using the CHG and inform your nurse when you arrive at Short Stay.  Do not shave (including legs and underarms) for at least 48 hours prior to the first CHG shower.  You may shave your face.  Please follow these instructions carefully:   1.  Shower with CHG Soap the night before surgery and the                                morning of Surgery.  2.  If you choose to wash your hair, wash your hair first as usual with your       normal shampoo.  3.  After you shampoo, rinse your hair and body thoroughly to remove the                      Shampoo.  4.  Use CHG as you would any other liquid soap.  You can apply chg directly       to the skin and wash gently with scrungie or a clean washcloth.  5.  Apply the CHG Soap to your body ONLY FROM THE NECK DOWN.        Do not use on open wounds or open sores.  Avoid contact with your eyes,       ears, mouth and genitals (private parts).  Wash genitals (private parts)       with your normal soap.  6.  Wash thoroughly, paying special attention to the area where your surgery        will be performed.  7.  Thoroughly rinse your body with warm water from the neck down.  8.  DO NOT shower/wash with your normal soap after using and rinsing off       the CHG Soap.  9.  Pat yourself dry with a clean towel.            10.  Wear clean pajamas.            11.  Place clean sheets on your bed the night of your first shower and do not        sleep with pets.  Day of Surgery  Do not apply any lotions/deoderants the morning of surgery.  Please wear clean clothes to the hospital/surgery center.  pecial instructions:    Please read over the following fact sheets that you were given. Pain Booklet, Coughing and Deep Breathing, MRSA Information and Surgical Site Infection Prevention

## 2016-01-17 NOTE — Progress Notes (Addendum)
Cardiologist denies  Medical Md is Dr.Stephen Sharlene Motts  Echo denies  Stress test denies  Heart cath denies  EKG in epic from 07-04-15  CXR in epic from 10-09-15

## 2016-01-27 MED ORDER — TRANEXAMIC ACID 1000 MG/10ML IV SOLN
1000.0000 mg | INTRAVENOUS | Status: AC
  Administered 2016-01-28: 1000 mg via INTRAVENOUS
  Filled 2016-01-27: qty 10

## 2016-01-28 ENCOUNTER — Inpatient Hospital Stay (HOSPITAL_COMMUNITY): Payer: Medicare Other

## 2016-01-28 ENCOUNTER — Encounter (HOSPITAL_COMMUNITY): Payer: Self-pay | Admitting: Anesthesiology

## 2016-01-28 ENCOUNTER — Inpatient Hospital Stay (HOSPITAL_COMMUNITY): Payer: Medicare Other | Admitting: Anesthesiology

## 2016-01-28 ENCOUNTER — Inpatient Hospital Stay (HOSPITAL_COMMUNITY)
Admission: RE | Admit: 2016-01-28 | Discharge: 2016-01-30 | DRG: 470 | Disposition: A | Discharge status: Home Health | Payer: Medicare Other | Source: Ambulatory Visit | Attending: Orthopedic Surgery | Admitting: Orthopedic Surgery

## 2016-01-28 ENCOUNTER — Encounter (HOSPITAL_COMMUNITY)
Admission: RE | Disposition: A | Discharge status: Home Health | Payer: Self-pay | Source: Ambulatory Visit | Attending: Orthopedic Surgery

## 2016-01-28 DIAGNOSIS — G8929 Other chronic pain: Secondary | ICD-10-CM | POA: Diagnosis present

## 2016-01-28 DIAGNOSIS — M542 Cervicalgia: Secondary | ICD-10-CM | POA: Diagnosis present

## 2016-01-28 DIAGNOSIS — Z886 Allergy status to analgesic agent status: Secondary | ICD-10-CM | POA: Diagnosis not present

## 2016-01-28 DIAGNOSIS — Z981 Arthrodesis status: Secondary | ICD-10-CM | POA: Diagnosis not present

## 2016-01-28 DIAGNOSIS — Z888 Allergy status to other drugs, medicaments and biological substances status: Secondary | ICD-10-CM | POA: Diagnosis not present

## 2016-01-28 DIAGNOSIS — E876 Hypokalemia: Secondary | ICD-10-CM | POA: Diagnosis not present

## 2016-01-28 DIAGNOSIS — F411 Generalized anxiety disorder: Secondary | ICD-10-CM | POA: Diagnosis present

## 2016-01-28 DIAGNOSIS — Z419 Encounter for procedure for purposes other than remedying health state, unspecified: Secondary | ICD-10-CM

## 2016-01-28 DIAGNOSIS — Z79899 Other long term (current) drug therapy: Secondary | ICD-10-CM

## 2016-01-28 DIAGNOSIS — N183 Chronic kidney disease, stage 3 (moderate): Secondary | ICD-10-CM | POA: Diagnosis present

## 2016-01-28 DIAGNOSIS — Z885 Allergy status to narcotic agent status: Secondary | ICD-10-CM

## 2016-01-28 DIAGNOSIS — Z85828 Personal history of other malignant neoplasm of skin: Secondary | ICD-10-CM

## 2016-01-28 DIAGNOSIS — M169 Osteoarthritis of hip, unspecified: Secondary | ICD-10-CM | POA: Diagnosis not present

## 2016-01-28 DIAGNOSIS — M545 Low back pain, unspecified: Secondary | ICD-10-CM | POA: Diagnosis present

## 2016-01-28 DIAGNOSIS — I10 Essential (primary) hypertension: Secondary | ICD-10-CM | POA: Diagnosis present

## 2016-01-28 DIAGNOSIS — Z471 Aftercare following joint replacement surgery: Secondary | ICD-10-CM | POA: Diagnosis not present

## 2016-01-28 DIAGNOSIS — Z96641 Presence of right artificial hip joint: Secondary | ICD-10-CM | POA: Diagnosis not present

## 2016-01-28 DIAGNOSIS — F329 Major depressive disorder, single episode, unspecified: Secondary | ICD-10-CM | POA: Diagnosis present

## 2016-01-28 DIAGNOSIS — Z91041 Radiographic dye allergy status: Secondary | ICD-10-CM

## 2016-01-28 DIAGNOSIS — E785 Hyperlipidemia, unspecified: Secondary | ICD-10-CM | POA: Diagnosis present

## 2016-01-28 DIAGNOSIS — D62 Acute posthemorrhagic anemia: Secondary | ICD-10-CM | POA: Diagnosis not present

## 2016-01-28 DIAGNOSIS — J42 Unspecified chronic bronchitis: Secondary | ICD-10-CM | POA: Diagnosis not present

## 2016-01-28 DIAGNOSIS — Z85118 Personal history of other malignant neoplasm of bronchus and lung: Secondary | ICD-10-CM | POA: Diagnosis not present

## 2016-01-28 DIAGNOSIS — M810 Age-related osteoporosis without current pathological fracture: Secondary | ICD-10-CM | POA: Diagnosis not present

## 2016-01-28 DIAGNOSIS — Z9889 Other specified postprocedural states: Secondary | ICD-10-CM

## 2016-01-28 DIAGNOSIS — G2581 Restless legs syndrome: Secondary | ICD-10-CM | POA: Diagnosis not present

## 2016-01-28 DIAGNOSIS — Z880 Allergy status to penicillin: Secondary | ICD-10-CM | POA: Diagnosis not present

## 2016-01-28 DIAGNOSIS — I129 Hypertensive chronic kidney disease with stage 1 through stage 4 chronic kidney disease, or unspecified chronic kidney disease: Secondary | ICD-10-CM | POA: Diagnosis not present

## 2016-01-28 DIAGNOSIS — M1611 Unilateral primary osteoarthritis, right hip: Principal | ICD-10-CM | POA: Diagnosis present

## 2016-01-28 HISTORY — PX: TOTAL HIP ARTHROPLASTY: SHX124

## 2016-01-28 SURGERY — ARTHROPLASTY, HIP, TOTAL, ANTERIOR APPROACH
Anesthesia: Spinal | Site: Hip | Laterality: Right

## 2016-01-28 MED ORDER — MENTHOL 3 MG MT LOZG: 1.0000 | LOZENGE | OROMUCOSAL | Status: DC | PRN

## 2016-01-28 MED ORDER — FLEET ENEMA 7-19 GM/118ML RE ENEM: 1.0000 | ENEMA | Freq: Once | RECTAL | Status: DC | PRN

## 2016-01-28 MED ORDER — BUPIVACAINE HCL (PF) 0.5 % IJ SOLN
INTRAMUSCULAR | Status: DC | PRN
  Administered 2016-01-28: 3 mL via INTRATHECAL

## 2016-01-28 MED ORDER — VANCOMYCIN HCL 1000 MG IV SOLR
INTRAVENOUS | Status: DC | PRN
  Administered 2016-01-28: 1000 mg via INTRAVENOUS

## 2016-01-28 MED ORDER — PROPOFOL 500 MG/50ML IV EMUL
INTRAVENOUS | Status: DC | PRN
Start: 2016-01-28 — End: 2016-01-28
  Administered 2016-01-28: 100 ug/kg/min via INTRAVENOUS
  Administered 2016-01-28: 12:00:00 via INTRAVENOUS

## 2016-01-28 MED ORDER — FENTANYL CITRATE (PF) 250 MCG/5ML IJ SOLN
INTRAMUSCULAR | Status: AC
  Filled 2016-01-28: qty 5

## 2016-01-28 MED ORDER — LIDOCAINE HCL (CARDIAC) 20 MG/ML IV SOLN
INTRAVENOUS | Status: DC | PRN
  Administered 2016-01-28: 20 mg via INTRAVENOUS

## 2016-01-28 MED ORDER — OMEPRAZOLE 20 MG PO CPDR: 20.0000 mg | DELAYED_RELEASE_CAPSULE | Freq: Every day | ORAL | 0 refills | Status: DC

## 2016-01-28 MED ORDER — PROPOFOL 10 MG/ML IV BOLUS
INTRAVENOUS | Status: AC
  Filled 2016-01-28: qty 20

## 2016-01-28 MED ORDER — ACETAMINOPHEN 325 MG PO TABS: 650.0000 mg | ORAL_TABLET | Freq: Four times a day (QID) | ORAL | Status: DC | PRN

## 2016-01-28 MED ORDER — EPHEDRINE SULFATE 50 MG/ML IJ SOLN
INTRAMUSCULAR | Status: DC | PRN
  Administered 2016-01-28 (×2): 10 mg via INTRAVENOUS

## 2016-01-28 MED ORDER — TRIAMTERENE-HCTZ 37.5-25 MG PO TABS
1.0000 | ORAL_TABLET | Freq: Every day | ORAL | Status: DC
  Administered 2016-01-28 – 2016-01-30 (×3): 1 via ORAL
  Filled 2016-01-28 (×3): qty 1

## 2016-01-28 MED ORDER — ONDANSETRON HCL 4 MG/2ML IJ SOLN: 4.0000 mg | Freq: Four times a day (QID) | INTRAMUSCULAR | Status: DC | PRN

## 2016-01-28 MED ORDER — KETOROLAC TROMETHAMINE 15 MG/ML IJ SOLN
7.5000 mg | Freq: Four times a day (QID) | INTRAMUSCULAR | Status: AC
  Administered 2016-01-28 – 2016-01-29 (×3): 7.5 mg via INTRAVENOUS
  Filled 2016-01-28 (×3): qty 1

## 2016-01-28 MED ORDER — MORPHINE SULFATE (PF) 2 MG/ML IV SOLN: 2.0000 mg | INTRAVENOUS | Status: DC | PRN

## 2016-01-28 MED ORDER — SENNA 8.6 MG PO TABS: 1.0000 | ORAL_TABLET | Freq: Two times a day (BID) | ORAL | 0 refills | Status: AC

## 2016-01-28 MED ORDER — MORPHINE SULFATE (PF) 2 MG/ML IV SOLN
2.0000 mg | INTRAVENOUS | Status: DC | PRN
Start: 2016-01-28 — End: 2016-01-30
  Administered 2016-01-28: 2 mg via INTRAVENOUS
  Filled 2016-01-28: qty 1

## 2016-01-28 MED ORDER — METOCLOPRAMIDE HCL 5 MG PO TABS: 5.0000 mg | ORAL_TABLET | Freq: Three times a day (TID) | ORAL | Status: DC | PRN

## 2016-01-28 MED ORDER — VANCOMYCIN HCL IN DEXTROSE 1-5 GM/200ML-% IV SOLN
1000.0000 mg | INTRAVENOUS | Status: AC
  Administered 2016-01-28: 1000 mg via INTRAVENOUS
  Filled 2016-01-28: qty 200

## 2016-01-28 MED ORDER — OXYCODONE HCL 5 MG PO TABS
ORAL_TABLET | ORAL | Status: AC
  Filled 2016-01-28: qty 2

## 2016-01-28 MED ORDER — DEXTROSE-NACL 5-0.45 % IV SOLN
INTRAVENOUS | Status: DC
  Administered 2016-01-28: 75 mL via INTRAVENOUS

## 2016-01-28 MED ORDER — DIPHENHYDRAMINE HCL 12.5 MG/5ML PO ELIX: 12.5000 mg | ORAL_SOLUTION | ORAL | Status: DC | PRN

## 2016-01-28 MED ORDER — ACETAMINOPHEN 650 MG RE SUPP: 650.0000 mg | Freq: Four times a day (QID) | RECTAL | Status: DC | PRN

## 2016-01-28 MED ORDER — KETOROLAC TROMETHAMINE 15 MG/ML IJ SOLN
INTRAMUSCULAR | Status: AC
  Filled 2016-01-28: qty 1

## 2016-01-28 MED ORDER — SORBITOL 70 % SOLN
30.0000 mL | Freq: Every day | Status: DC | PRN
Start: 2016-01-28 — End: 2016-01-30

## 2016-01-28 MED ORDER — PHENYLEPHRINE 40 MCG/ML (10ML) SYRINGE FOR IV PUSH (FOR BLOOD PRESSURE SUPPORT)
PREFILLED_SYRINGE | INTRAVENOUS | Status: AC
  Filled 2016-01-28: qty 10

## 2016-01-28 MED ORDER — ACETAMINOPHEN 10 MG/ML IV SOLN
INTRAVENOUS | Status: AC
  Filled 2016-01-28: qty 100

## 2016-01-28 MED ORDER — SENNA 8.6 MG PO TABS
1.0000 | ORAL_TABLET | Freq: Two times a day (BID) | ORAL | Status: DC
  Administered 2016-01-28 – 2016-01-30 (×4): 8.6 mg via ORAL
  Filled 2016-01-28 (×4): qty 1

## 2016-01-28 MED ORDER — LIDOCAINE 2% (20 MG/ML) 5 ML SYRINGE
INTRAMUSCULAR | Status: AC
  Filled 2016-01-28: qty 5

## 2016-01-28 MED ORDER — PHENYLEPHRINE HCL 10 MG/ML IJ SOLN
INTRAMUSCULAR | Status: DC | PRN
  Administered 2016-01-28 (×4): 80 ug via INTRAVENOUS

## 2016-01-28 MED ORDER — ACETAMINOPHEN 500 MG PO TABS
1000.0000 mg | ORAL_TABLET | Freq: Once | ORAL | Status: AC
Start: 2016-01-28 — End: 2016-01-28
  Administered 2016-01-28: 1000 mg via ORAL
  Filled 2016-01-28: qty 2

## 2016-01-28 MED ORDER — LACTATED RINGERS IV SOLN
INTRAVENOUS | Status: DC
  Administered 2016-01-28 (×3): via INTRAVENOUS

## 2016-01-28 MED ORDER — POTASSIUM CHLORIDE CRYS ER 20 MEQ PO TBCR
20.0000 meq | EXTENDED_RELEASE_TABLET | Freq: Every day | ORAL | Status: DC
  Administered 2016-01-28 – 2016-01-29 (×2): 20 meq via ORAL
  Filled 2016-01-28 (×2): qty 1

## 2016-01-28 MED ORDER — PROMETHAZINE HCL 25 MG/ML IJ SOLN
INTRAMUSCULAR | Status: AC
  Filled 2016-01-28: qty 1

## 2016-01-28 MED ORDER — FENTANYL CITRATE (PF) 100 MCG/2ML IJ SOLN
25.0000 ug | INTRAMUSCULAR | Status: DC | PRN
  Administered 2016-01-28 (×2): 50 ug via INTRAVENOUS

## 2016-01-28 MED ORDER — SCOPOLAMINE 1 MG/3DAYS TD PT72
MEDICATED_PATCH | TRANSDERMAL | Status: DC | PRN
  Administered 2016-01-28: 1 via TRANSDERMAL

## 2016-01-28 MED ORDER — ONDANSETRON HCL 4 MG PO TABS: 4.0000 mg | ORAL_TABLET | Freq: Three times a day (TID) | ORAL | 0 refills | Status: DC | PRN

## 2016-01-28 MED ORDER — FENTANYL CITRATE (PF) 100 MCG/2ML IJ SOLN
INTRAMUSCULAR | Status: AC
  Filled 2016-01-28: qty 2

## 2016-01-28 MED ORDER — AMLODIPINE BESYLATE 5 MG PO TABS
5.0000 mg | ORAL_TABLET | Freq: Every day | ORAL | Status: DC
  Administered 2016-01-29 – 2016-01-30 (×2): 5 mg via ORAL
  Filled 2016-01-28 (×2): qty 1

## 2016-01-28 MED ORDER — ONDANSETRON HCL 4 MG/2ML IJ SOLN
INTRAMUSCULAR | Status: DC | PRN
  Administered 2016-01-28: 4 mg via INTRAVENOUS

## 2016-01-28 MED ORDER — ONDANSETRON HCL 4 MG PO TABS: 4.0000 mg | ORAL_TABLET | Freq: Four times a day (QID) | ORAL | Status: DC | PRN

## 2016-01-28 MED ORDER — VANCOMYCIN HCL IN DEXTROSE 750-5 MG/150ML-% IV SOLN
750.0000 mg | Freq: Two times a day (BID) | INTRAVENOUS | Status: DC
  Administered 2016-01-28 – 2016-01-29 (×2): 750 mg via INTRAVENOUS
  Filled 2016-01-28 (×3): qty 150

## 2016-01-28 MED ORDER — DEXAMETHASONE SODIUM PHOSPHATE 10 MG/ML IJ SOLN
10.0000 mg | Freq: Once | INTRAMUSCULAR | Status: AC
  Administered 2016-01-29: 10 mg via INTRAVENOUS
  Filled 2016-01-28: qty 1

## 2016-01-28 MED ORDER — MEPERIDINE HCL 25 MG/ML IJ SOLN: 6.2500 mg | INTRAMUSCULAR | Status: DC | PRN

## 2016-01-28 MED ORDER — GABAPENTIN 300 MG PO CAPS
300.0000 mg | ORAL_CAPSULE | Freq: Three times a day (TID) | ORAL | Status: DC
  Administered 2016-01-28 – 2016-01-30 (×6): 300 mg via ORAL
  Filled 2016-01-28 (×6): qty 1

## 2016-01-28 MED ORDER — DIPHENHYDRAMINE HCL 50 MG/ML IJ SOLN
INTRAMUSCULAR | Status: DC | PRN
  Administered 2016-01-28: 25 mg via INTRAVENOUS

## 2016-01-28 MED ORDER — ONDANSETRON HCL 4 MG/2ML IJ SOLN
INTRAMUSCULAR | Status: AC
  Filled 2016-01-28: qty 2

## 2016-01-28 MED ORDER — FENTANYL CITRATE (PF) 100 MCG/2ML IJ SOLN
INTRAMUSCULAR | Status: DC | PRN
  Administered 2016-01-28: 50 ug via INTRAVENOUS

## 2016-01-28 MED ORDER — 0.9 % SODIUM CHLORIDE (POUR BTL) OPTIME
TOPICAL | Status: DC | PRN
  Administered 2016-01-28: 1000 mL

## 2016-01-28 MED ORDER — ASPIRIN EC 325 MG PO TBEC: 325.0000 mg | DELAYED_RELEASE_TABLET | Freq: Every day | ORAL | 0 refills | Status: DC

## 2016-01-28 MED ORDER — BACLOFEN 10 MG PO TABS: 10.0000 mg | ORAL_TABLET | Freq: Three times a day (TID) | ORAL | 0 refills | Status: DC | PRN

## 2016-01-28 MED ORDER — PROPOFOL 1000 MG/100ML IV EMUL
INTRAVENOUS | Status: AC
  Filled 2016-01-28: qty 100

## 2016-01-28 MED ORDER — DEXAMETHASONE SODIUM PHOSPHATE 10 MG/ML IJ SOLN
INTRAMUSCULAR | Status: AC
  Filled 2016-01-28: qty 1

## 2016-01-28 MED ORDER — ADULT MULTIVITAMIN W/MINERALS CH
1.0000 | ORAL_TABLET | Freq: Every day | ORAL | Status: DC
  Administered 2016-01-29 – 2016-01-30 (×2): 1 via ORAL
  Filled 2016-01-28 (×2): qty 1

## 2016-01-28 MED ORDER — PROMETHAZINE HCL 25 MG/ML IJ SOLN: 6.2500 mg | INTRAMUSCULAR | Status: DC | PRN

## 2016-01-28 MED ORDER — ACETAMINOPHEN 10 MG/ML IV SOLN
INTRAVENOUS | Status: DC | PRN
  Administered 2016-01-28: 1000 mg via INTRAVENOUS

## 2016-01-28 MED ORDER — ASPIRIN EC 325 MG PO TBEC
325.0000 mg | DELAYED_RELEASE_TABLET | Freq: Every day | ORAL | Status: DC
  Administered 2016-01-29 – 2016-01-30 (×2): 325 mg via ORAL
  Filled 2016-01-28 (×2): qty 1

## 2016-01-28 MED ORDER — OXYCODONE-ACETAMINOPHEN 5-325 MG PO TABS: 1.0000 | ORAL_TABLET | ORAL | 0 refills | Status: DC | PRN

## 2016-01-28 MED ORDER — PHENOL 1.4 % MT LIQD: 1.0000 | OROMUCOSAL | Status: DC | PRN

## 2016-01-28 MED ORDER — METOCLOPRAMIDE HCL 5 MG/ML IJ SOLN: 5.0000 mg | Freq: Three times a day (TID) | INTRAMUSCULAR | Status: DC | PRN

## 2016-01-28 MED ORDER — CELECOXIB 200 MG PO CAPS: 200.0000 mg | ORAL_CAPSULE | Freq: Two times a day (BID) | ORAL | Status: DC

## 2016-01-28 MED ORDER — CEFAZOLIN SODIUM-DEXTROSE 2-4 GM/100ML-% IV SOLN: 2.0000 g | Freq: Four times a day (QID) | INTRAVENOUS | Status: DC

## 2016-01-28 MED ORDER — OXYCODONE HCL 5 MG PO TABS
5.0000 mg | ORAL_TABLET | ORAL | Status: DC | PRN
  Administered 2016-01-28 – 2016-01-30 (×8): 10 mg via ORAL
  Filled 2016-01-28 (×7): qty 2

## 2016-01-28 MED ORDER — DEXAMETHASONE SODIUM PHOSPHATE 10 MG/ML IJ SOLN
INTRAMUSCULAR | Status: DC | PRN
  Administered 2016-01-28: 10 mg via INTRAVENOUS

## 2016-01-28 SURGICAL SUPPLY — 55 items
BAG DECANTER FOR FLEXI CONT (MISCELLANEOUS) IMPLANT
BLADE SAG 18X100X1.27 (BLADE) ×3 IMPLANT
BLADE SAW SGTL 18X1.27X75 (BLADE) IMPLANT
BLADE SAW SGTL 18X1.27X75MM (BLADE)
BLADE SURG ROTATE 9660 (MISCELLANEOUS) IMPLANT
CAPT HIP TOTAL 2 ×3 IMPLANT
CLOSURE STERI-STRIP 1/2X4 (GAUZE/BANDAGES/DRESSINGS) ×1
CLSR STERI-STRIP ANTIMIC 1/2X4 (GAUZE/BANDAGES/DRESSINGS) ×2 IMPLANT
COVER PERINEAL POST (MISCELLANEOUS) ×3 IMPLANT
COVER SURGICAL LIGHT HANDLE (MISCELLANEOUS) ×3 IMPLANT
DRAPE C-ARM 42X72 X-RAY (DRAPES) ×3 IMPLANT
DRAPE IMP U-DRAPE 54X76 (DRAPES) ×3 IMPLANT
DRAPE STERI IOBAN 125X83 (DRAPES) ×3 IMPLANT
DRAPE U-SHAPE 47X51 STRL (DRAPES) ×6 IMPLANT
DRSG MEPILEX BORDER 4X8 (GAUZE/BANDAGES/DRESSINGS) ×3 IMPLANT
DURAPREP 26ML APPLICATOR (WOUND CARE) ×3 IMPLANT
ELECT BLADE 4.0 EZ CLEAN MEGAD (MISCELLANEOUS) ×3
ELECT REM PT RETURN 9FT ADLT (ELECTROSURGICAL) ×3
ELECTRODE BLDE 4.0 EZ CLN MEGD (MISCELLANEOUS) ×1 IMPLANT
ELECTRODE REM PT RTRN 9FT ADLT (ELECTROSURGICAL) ×1 IMPLANT
FACESHIELD WRAPAROUND (MASK) ×6 IMPLANT
GLOVE BIO SURGEON STRL SZ7 (GLOVE) IMPLANT
GLOVE BIO SURGEON STRL SZ7.5 (GLOVE) ×3 IMPLANT
GLOVE BIOGEL PI IND STRL 7.0 (GLOVE) ×1 IMPLANT
GLOVE BIOGEL PI IND STRL 8 (GLOVE) ×1 IMPLANT
GLOVE BIOGEL PI INDICATOR 7.0 (GLOVE) ×2
GLOVE BIOGEL PI INDICATOR 8 (GLOVE) ×2
GOWN STRL REUS W/ TWL LRG LVL3 (GOWN DISPOSABLE) ×2 IMPLANT
GOWN STRL REUS W/ TWL XL LVL3 (GOWN DISPOSABLE) ×1 IMPLANT
GOWN STRL REUS W/TWL LRG LVL3 (GOWN DISPOSABLE) ×6
GOWN STRL REUS W/TWL XL LVL3 (GOWN DISPOSABLE) ×3
KIT BASIN OR (CUSTOM PROCEDURE TRAY) ×3 IMPLANT
KIT ROOM TURNOVER OR (KITS) ×3 IMPLANT
MANIFOLD NEPTUNE II (INSTRUMENTS) ×3 IMPLANT
NDL SAFETY ECLIPSE 18X1.5 (NEEDLE) ×1 IMPLANT
NEEDLE 18GX1X1/2 (RX/OR ONLY) (NEEDLE) ×3 IMPLANT
NEEDLE HYPO 18GX1.5 SHARP (NEEDLE) ×2
NS IRRIG 1000ML POUR BTL (IV SOLUTION) ×3 IMPLANT
PACK TOTAL JOINT (CUSTOM PROCEDURE TRAY) ×3 IMPLANT
PACK UNIVERSAL I (CUSTOM PROCEDURE TRAY) ×3 IMPLANT
PAD ARMBOARD 7.5X6 YLW CONV (MISCELLANEOUS) ×3 IMPLANT
SPONGE LAP 18X18 X RAY DECT (DISPOSABLE) IMPLANT
SUT MNCRL AB 4-0 PS2 18 (SUTURE) ×3 IMPLANT
SUT MON AB 2-0 CT1 36 (SUTURE) ×3 IMPLANT
SUT VIC AB 0 CT1 27 (SUTURE) ×3
SUT VIC AB 0 CT1 27XBRD ANBCTR (SUTURE) ×1 IMPLANT
SUT VIC AB 1 CT1 27 (SUTURE) ×3
SUT VIC AB 1 CT1 27XBRD ANBCTR (SUTURE) ×1 IMPLANT
SYR 50ML LL SCALE MARK (SYRINGE) ×3 IMPLANT
SYRINGE 20CC LL (MISCELLANEOUS) ×3 IMPLANT
TOWEL OR 17X24 6PK STRL BLUE (TOWEL DISPOSABLE) ×3 IMPLANT
TOWEL OR 17X26 10 PK STRL BLUE (TOWEL DISPOSABLE) ×3 IMPLANT
TRAY FOLEY CATH 16FRSI W/METER (SET/KITS/TRAYS/PACK) IMPLANT
WATER STERILE IRR 1000ML POUR (IV SOLUTION) ×3 IMPLANT
YANKAUER SUCT BULB TIP NO VENT (SUCTIONS) ×3 IMPLANT

## 2016-01-28 NOTE — Anesthesia Postprocedure Evaluation (Signed)
Anesthesia Post Note  Patient: Karen Watson  Procedure(s) Performed: Procedure(s) (LRB): RIGHT TOTAL HIP ARTHROPLASTY ANTERIOR APPROACH (Right)  Patient location during evaluation: PACU Anesthesia Type: Spinal Level of consciousness: sedated and patient cooperative Pain management: pain level controlled Vital Signs Assessment: post-procedure vital signs reviewed and stable Respiratory status: spontaneous breathing Cardiovascular status: stable Anesthetic complications: no    Last Vitals:  Vitals:   01/28/16 1014 01/28/16 1345  BP: 134/70 (!) 103/51  Pulse: 72 61  Resp: 18 14  Temp: 36.9 C (!) 35.4 C    Last Pain:  Vitals:   01/28/16 1020  TempSrc:   PainSc: [place]

## 2016-01-28 NOTE — Op Note (Signed)
01/28/2016  1:03 PM  PATIENT:  Karen Watson   MRN: [id number]  PRE-OPERATIVE DIAGNOSIS:  OA RIGHT HIP  POST-OPERATIVE DIAGNOSIS:  OA RIGHT HIP  PROCEDURE:  Procedure(s): RIGHT TOTAL HIP ARTHROPLASTY ANTERIOR APPROACH  PREOPERATIVE INDICATIONS:    Karen Watson is an 75 y.o. female who has a diagnosis of Primary osteoarthritis of right hip and elected for surgical management after failing conservative treatment.  The risks benefits and alternatives were discussed with the patient including but not limited to the risks of nonoperative treatment, versus surgical intervention including infection, bleeding, nerve injury, periprosthetic fracture, the need for revision surgery, dislocation, leg length discrepancy, blood clots, cardiopulmonary complications, morbidity, mortality, among others, and they were willing to proceed.     OPERATIVE REPORT     SURGEON:   MURPHY, Ernesta Amble, MD    ASSISTANT:  Roxan Hockey, PA-C, he was present and scrubbed throughout the case, critical for completion in a timely fashion, and for retraction, instrumentation, and closure.     ANESTHESIA:  General    COMPLICATIONS:  None.     COMPONENTS:  [employer]ker acolade fit femur size 5 with a 36 mm -2.5 head ball and a PSL acetabular shell size 52 with a  polyethylene liner    PROCEDURE IN DETAIL:   The patient was met in the holding area and  identified.  The appropriate hip was identified and marked at the operative site.  The patient was then transported to the OR  and  placed under anesthesia per that record.  At that point, the patient was  placed in the supine position and  secured to the operating room table and all bony prominences padded. He received pre-operative antibiotics    The operative lower extremity was prepped from the iliac crest to the distal leg.  Sterile draping was performed.  Time out was performed prior to incision.      Skin incision was made just 2 cm lateral to the ASIS  extending  in line with the tensor fascia lata. Electrocautery was used to control all bleeders. I dissected down sharply to the fascia of the tensor fascia lata was confirmed that the muscle fibers beneath were running posteriorly. I then incised the fascia over the superficial tensor fascia lata in line with the incision. The fascia was elevated off the anterior aspect of the muscle the muscle was retracted posteriorly and protected throughout the case. I then used electrocautery to incise the tensor fascia lata fascia control and all bleeders. Immediately visible was the fat over top of the anterior neck and capsule.  I removed the anterior fat from the capsule and elevated the rectus muscle off of the anterior capsule. I then removed a large time of capsule. The retractors were then placed over the anterior acetabulum as well as around the superior and inferior neck.  I then removed a section of the femoral neck and a napkin ring fashion. Then used the power course to remove the femoral head from the acetabulum and thoroughly irrigated the acetabulum. I sized the femoral head.    I then exposed the deep acetabulum, cleared out any tissue including the ligamentum teres.   After adequate visualization, I excised the labrum, and then sequentially reamed.  I then impacted the acetabular implant into place using fluoroscopy for guidance.  Appropriate version and inclination was confirmed clinically matching their bony anatomy, and with fluoroscopy.  I placed a 54m screw in the posterior/superio position with an excellent bite.  I then placed the polyethylene liner in place  I then adducted the leg and released the external rotators from the posterior femur allowing it to be easily delivered up lateral and anterior to the acetabulum for preparation of the femoral canal.    I then prepared the proximal femur using the cookie-cutter and then sequentially reamed and broached.  A trial broach, neck, and head was  utilized, and I reduced the hip and used floroscopy to assess the neck length and femoral implant.  I then impacted the femoral prosthesis into place into the appropriate version. The hip was then reduced and fluoroscopy confirmed appropriate position. Leg lengths were restored.  I then irrigated the hip copiously again with, and repaired the fascia with Vicryl, followed by monocryl for the subcutaneous tissue, Monocryl for the skin, Steri-Strips and sterile gauze. The patient was then awakened and returned to PACU in stable and satisfactory condition. There were no complications.  POST OPERATIVE PLAN: WBAT, DVT px: SCD's/TED and ASA 325  Edmonia Lynch, MD Orthopedic Surgeon [phone]

## 2016-01-28 NOTE — Anesthesia Procedure Notes (Signed)
Spinal  Patient location during procedure: OR Staffing Anesthesiologist: Nolon Nations Performed: anesthesiologist  Preanesthetic Checklist Completed: patient identified, site marked, surgical consent, pre-op evaluation, timeout performed, IV checked, risks and benefits discussed and monitors and equipment checked Spinal Block Patient position: sitting Prep: Betadine Patient monitoring: heart rate, continuous pulse ox and blood pressure Approach: right paramedian Location: L2-3 Injection technique: single-shot Needle Needle type: Sprotte  Needle gauge: 24 G Needle length: 9 cm Additional Notes Expiration date of kit checked and confirmed. Patient tolerated procedure well, without complications.

## 2016-01-28 NOTE — Progress Notes (Signed)
Pt states she can take tylenol with out problems. She takes it at home as needed. Tylenol was ordered today as pre-op.

## 2016-01-28 NOTE — Transfer of Care (Signed)
Immediate Anesthesia Transfer of Care Note  Patient: Karen Watson  Procedure(s) Performed: Procedure(s): RIGHT TOTAL HIP ARTHROPLASTY ANTERIOR APPROACH (Right)  Patient Location: PACU  Anesthesia Type:Spinal  Level of Consciousness: awake, alert , oriented and patient cooperative  Airway & Oxygen Therapy: Patient Spontanous Breathing and Patient connected to nasal cannula oxygen  Post-op Assessment: Report given to RN and Post -op Vital signs reviewed and stable  Post vital signs: Reviewed and stable  Last Vitals:  Vitals:   01/28/16 1014  BP: 134/70  Pulse: 72  Resp: 18  Temp: 36.9 C    Last Pain:  Vitals:   01/28/16 1020  TempSrc:   PainSc: 6       Patients Stated Pain Goal: 3 (38/37/79 3968)  Complications: No apparent anesthesia complications

## 2016-01-28 NOTE — Discharge Instructions (Signed)

## 2016-01-28 NOTE — Anesthesia Preprocedure Evaluation (Signed)
Anesthesia Evaluation  Patient identified by MRN, date of birth, ID band Patient awake  General Assessment Comment:HTN, CKD (stage 3), lung cancer, post-op N/V. Never smoker. BMI 27. S/p lumbar fusion 06/08/12.   Reviewed: Allergy & Precautions, NPO status , Patient's Chart, lab work & pertinent test results  History of Anesthesia Complications (+) PONV, Family history of anesthesia reaction and history of anesthetic complications  Airway Mallampati: II  TM Distance: >3 FB Neck ROM: Full    Dental no notable dental hx. (+) Teeth Intact, Dental Advisory Given   Pulmonary pneumonia, resolved,  Lung cancer   Pulmonary exam normal breath sounds clear to auscultation       Cardiovascular hypertension, Pt. on medications Normal cardiovascular exam Rhythm:Regular Rate:Normal  EKG 07/04/15: Sinus Rhythm Nonspecific T-abnormality   Neuro/Psych  Headaches, PSYCHIATRIC DISORDERS Anxiety Depression Chronic neck pain negative neurological ROS     GI/Hepatic negative GI ROS, Neg liver ROS,   Endo/Other  negative endocrine ROS  Renal/GU Renal InsufficiencyRenal disease     Musculoskeletal  (+) Arthritis , Osteoarthritis,    Abdominal   Peds  Hematology negative hematology ROS (+)   Anesthesia Other Findings Day of surgery medications reviewed with the patient.  Reproductive/Obstetrics                             Anesthesia Physical  Anesthesia Plan  ASA: III  Anesthesia Plan:    Post-op Pain Management:    Induction: Intravenous  Airway Management Planned:   Additional Equipment:   Intra-op Plan:   Post-operative Plan:   Informed Consent: I have reviewed the patients History and Physical, chart, labs and discussed the procedure including the risks, benefits and alternatives for the proposed anesthesia with the patient or authorized representative who has indicated his/her understanding and  acceptance.   Dental advisory given  Plan Discussed with: CRNA  Anesthesia Plan Comments: (Risks/benefits of general anesthesia discussed with patient including risk of damage to teeth, lips, gum, and tongue, nausea/vomiting, allergic reactions to medications, and the possibility of heart attack, stroke and death.  All patient questions answered.  Patient wishes to proceed.  Multiple allergies reviewed with patient.)       Anesthesia Quick Evaluation                                  Anesthesia Evaluation  Patient identified by MRN, date of birth, ID band Patient awake    Reviewed: Allergy & Precautions, H&P , NPO status , Patient's Chart, lab work & pertinent test results  Airway Mallampati: II      Dental No notable dental hx.    Pulmonary  CXR noted. Dr. Ronnald Ramp talked to the patient in detail about the findings and follow up details and issues. Confirmed all with patient of this discussion that occurred preop 0815 CE          Cardiovascular hypertension, Pt. on medications Rhythm:Regular Rate:Normal     Neuro/Psych    GI/Hepatic negative GI ROS, Neg liver ROS,   Endo/Other  negative endocrine ROS  Renal/GU negative Renal ROS     Musculoskeletal   Abdominal   Peds  Hematology   Anesthesia Other Findings   Reproductive/Obstetrics                          Anesthesia Physical Anesthesia Plan  ASA:  III  Anesthesia Plan: General   Post-op Pain Management:    Induction: Intravenous  Airway Management Planned: Oral ETT  Additional Equipment:   Intra-op Plan:   Post-operative Plan: Post-operative intubation/ventilation  Informed Consent: I have reviewed the patients History and Physical, chart, labs and discussed the procedure including the risks, benefits and alternatives for the proposed anesthesia with the patient or authorized representative who has indicated his/her understanding and acceptance.   Dental  advisory given  Plan Discussed with: CRNA, Anesthesiologist and Surgeon  Anesthesia Plan Comments:         Anesthesia Quick Evaluation

## 2016-01-28 NOTE — Anesthesia Procedure Notes (Signed)
Procedure Name: MAC Date/Time: 01/28/2016 12:07 PM Performed by: Rebekah Chesterfield L Pre-anesthesia Checklist: Patient identified, Emergency Drugs available, Suction available, Patient being monitored and Timeout performed Patient Re-evaluated:Patient Re-evaluated prior to inductionOxygen Delivery Method: Nasal cannula and Simple face mask Preoxygenation: Pre-oxygenation with 100% oxygen Intubation Type: IV induction Airway Equipment and Method: Oral airway Placement Confirmation: breath sounds checked- equal and bilateral and positive ETCO2 Dental Injury: Teeth and Oropharynx as per pre-operative assessment

## 2016-01-29 ENCOUNTER — Encounter (HOSPITAL_COMMUNITY): Payer: Self-pay | Admitting: Orthopedic Surgery

## 2016-01-29 LAB — CBC
HEMATOCRIT: 29.9 % — AB (ref 36.0–46.0)
HEMOGLOBIN: 9.6 g/dL — AB (ref 12.0–15.0)
MCH: 31.2 pg (ref 26.0–34.0)
MCHC: 32.1 g/dL (ref 30.0–36.0)
MCV: 97.1 fL (ref 78.0–100.0)
Platelets: 200 10*3/uL (ref 150–400)
RBC: 3.08 MIL/uL — AB (ref 3.87–5.11)
RDW: 13.9 % (ref 11.5–15.5)
WBC: 11.5 10*3/uL — AB (ref 4.0–10.5)

## 2016-01-29 LAB — BASIC METABOLIC PANEL
ANION GAP: 10 (ref 5–15)
BUN: 10 mg/dL (ref 6–20)
CHLORIDE: 101 mmol/L (ref 101–111)
CO2: 25 mmol/L (ref 22–32)
Calcium: 8.9 mg/dL (ref 8.9–10.3)
Creatinine, Ser: 1.03 mg/dL — ABNORMAL HIGH (ref 0.44–1.00)
GFR calc Af Amer: >60 mL/min (ref >60)
GFR calc non Af Amer: 52 mL/min — ABNORMAL LOW (ref >60)
GLUCOSE: 162 mg/dL — AB (ref 65–99)
POTASSIUM: 3.2 mmol/L — AB (ref 3.5–5.1)
Sodium: 136 mmol/L (ref 135–145)

## 2016-01-29 MED ORDER — POTASSIUM CHLORIDE CRYS ER 20 MEQ PO TBCR
40.0000 meq | EXTENDED_RELEASE_TABLET | Freq: Two times a day (BID) | ORAL | Status: AC
  Administered 2016-01-29: 40 meq via ORAL
  Filled 2016-01-29: qty 2

## 2016-01-29 NOTE — Progress Notes (Addendum)
   Assessment: 1 Day Post-Op  S/P Procedure(s) (LRB): RIGHT TOTAL HIP ARTHROPLASTY ANTERIOR APPROACH (Right) by Dr. Ernesta Amble. [name] on 01/28/16  Principal Problem:   Primary osteoarthritis of right hip Active Problems:   Anxiety state   Depression   Essential hypertension   LOW BACK PAIN   Osteoporosis   S/P lumbar laminectomy   Primary localized osteoarthritis of right hip  ADDITIONAL DIAGNOSIS:  Acute Blood loss anemia - likely with dilutional component.   Hypokalemia - mild, will replete  Plan: Advance diet Up with therapy D/C IV fluids Discharge home with home health  Weight Bearing: Weight Bearing as Tolerated (WBAT) Right leg - Anterior Hip Precautions. Dressings: prn VTE prophylaxis: Aspirin, SCDs, ambulation Dispo: Home  With home health pending evaluation and clearance by PT.  Subjective: Patient feels great.  Patient reports pain as mild. Pain controlled with IV and PO meds.  Tolerating diet.  Urinating.  No CP, SOB, lightheadedness.  Not yet OOB.  Objective:   VITALS:   Vitals:   01/28/16 1716 01/28/16 2016 01/29/16 0322 01/29/16 0553  BP: (!) 111/53 (!) 110/52 (!) 118/53 (!) 113/52  Pulse: 69 69 61 63  Resp: '14  16 16  '$ Temp:   98.6 F (37 C) 98.5 F (36.9 C)  TempSrc:   Oral Oral  SpO2: 100% 98% 98% 96%  Weight:      Height:        Lab Results  Component Value Date   WBC 11.5 (H) 01/29/2016   HGB 9.6 (L) 01/29/2016   HCT 29.9 (L) 01/29/2016   MCV 97.1 01/29/2016   PLT 200 01/29/2016   BMET    Component Value Date/Time   NA 136 01/29/2016 0402   K 3.2 (L) 01/29/2016 0402   CL 101 01/29/2016 0402   CO2 25 01/29/2016 0402   GLUCOSE 162 (H) 01/29/2016 0402   BUN 10 01/29/2016 0402   CREATININE 1.03 (H) 01/29/2016 0402   CALCIUM 8.9 01/29/2016 0402   GFRNONAA 52 (L) 01/29/2016 0402   GFRAA >60 01/29/2016 0402    General: NAD.  Sitting upright in bed with book. Resp: clear to auscultation bilaterally Cardio: regular rate and  rhythm ABD soft Neurologically intact MSK Neurovascularly intact Intact pulses distally Dorsiflexion/Plantar flexion intact Incision: dressing C/D/I,  Mild surrounding erythema.   Karen Watson 01/29/2016, 6:51 AM

## 2016-01-29 NOTE — Evaluation (Signed)
Occupational Therapy Evaluation and Discharge Patient Details Name: Karen Watson MRN: [id number] DOB: 04/15/1941 Today's Date: 01/29/2016    History of Present Illness 75 y.o. female now s/p Rt anterior THA. PMH: vertigo, restless leg, lung cancer, hypertension, HTN, lumbar fusion.    Clinical Impression   Pt educated in hip precautions, use of AE for LB bathing and dressing, toilet transfers, shower transfers, standing grooming, safe footwear and transporting items safely with RW. Pt demonstrating understanding of all information. No further OT needs.    Follow Up Recommendations  No OT follow up    Equipment Recommendations  None recommended by OT    Recommendations for Other Services       Precautions / Restrictions Precautions Precautions: Anterior Hip;Fall Precaution Booklet Issued: Yes (comment) Precaution Comments: educated in hip precautions related to ADL and ADL transfers Restrictions Weight Bearing Restrictions: Yes RLE Weight Bearing: Weight bearing as tolerated      Mobility Bed Mobility               General bed mobility comments: in chair upon arrival  Transfers Overall transfer level: Needs assistance Equipment used: Rolling walker (2 wheeled) Transfers: Sit to/from Stand Sit to Stand: Supervision         General transfer comment: good technique once verbally reviewed    Balance Overall balance assessment: Needs assistance Sitting-balance support: No upper extremity supported Sitting balance-Leahy Scale: Good      Standing balance-Leahy Scale: Fair Standing balance comment: using rw                            ADL Overall ADL's : Needs assistance/impaired Eating/Feeding: Independent;Sitting   Grooming: Wash/dry hands;Standing;Supervision/safety   Upper Body Bathing: Set up;Sitting   Lower Body Bathing: Supervison/ safety;Sit to/from stand Lower Body Bathing Details (indicate cue type and reason): recommended long  handled bath sponge Upper Body Dressing : Set up;Sitting   Lower Body Dressing: Supervision/safety;Sit to/from stand Lower Body Dressing Details (indicate cue type and reason): educated and practiced use of reacher and sock aide, educated in safe footwear Toilet Transfer: Supervision/safety;RW;Ambulation   Toileting- Water quality scientist and Hygiene: Minimal assistance;Sit to/from stand Toileting - Clothing Manipulation Details (indicate cue type and reason): assist to keep gown from toilet Tub/ Shower Transfer: Supervision/safety;Ambulation;Shower seat;Rolling walker;Cueing for sequencing   Functional mobility during ADLs: Supervision/safety;Rolling walker;Cueing for sequencing General ADL Comments: Educated in multiple uses of 3 in 1.     Vision     Perception     Praxis      Pertinent Vitals/Pain Pain Assessment: No/denies pain     Hand Dominance Right   Extremity/Trunk Assessment Upper Extremity Assessment Upper Extremity Assessment: Overall WFL for tasks assessed   Lower Extremity Assessment Lower Extremity Assessment: Defer to PT evaluation RLE Deficits / Details: decreased active motion due to reports of tenderness and stiffness.    Cervical / Trunk Assessment Cervical / Trunk Assessment: Other exceptions (hx of back surgeries)   Communication Communication Communication: No difficulties   Cognition Arousal/Alertness: Awake/alert Behavior During Therapy: WFL for tasks assessed/performed Overall Cognitive Status: Within Functional Limits for tasks assessed                     General Comments       Exercises       Shoulder Instructions      Home Living Family/patient expects to be discharged to:: Private residence Living Arrangements: Spouse/significant other Available Help  at Discharge: Available 24 hours/day;Family Type of Home: House Home Access: Stairs to enter [employer] of Steps: 2 Entrance Stairs-Rails: None Home Layout:  One level     Bathroom Shower/Tub: Occupational psychologist: Handicapped height     Home Equipment: [place] - single point;Shower seat;Bedside commode;Adaptive equipment Adaptive Equipment: Reacher Additional Comments: daughter-in-law can stay with her if husband needs help.       Prior Functioning/Environment Level of Independence: Needs assistance  Gait / Transfers Assistance Needed: ambulated with a cane ADL's / Homemaking Assistance Needed: husband assisted with LB bathing and dressing   Comments: using cane for ambulation. Husband assisted with donning socks and shoes    OT Diagnosis: Generalized weakness;Acute pain   OT Problem List:     OT Treatment/Interventions:      OT Goals(Current goals can be found in the care plan section) Acute Rehab OT Goals Patient Stated Goal: eventually get back to swimming  OT Frequency:     Barriers to D/C:            Co-evaluation              End of Session Equipment Utilized During Treatment: Gait belt;Rolling walker  Activity Tolerance: Patient tolerated treatment well Patient left: in chair;with call bell/phone within reach   Time: 0932-3557 OT Time Calculation (min): 48 min Charges:  OT General Charges $OT Visit: 1 Procedure OT Evaluation $OT Eval Moderate Complexity: 1 Procedure OT Treatments $Self Care/Home Management : 23-37 mins G-Codes:    Malka So 01/29/2016, 11:46 AM  [phone]

## 2016-01-29 NOTE — Progress Notes (Signed)
Pt complained of increased pain with rating of 7/10- to right hip. 3rd page sent out to [facility] answering service. Awaiting response for further intervention.

## 2016-01-29 NOTE — Progress Notes (Signed)
Pt complained of increased pain with rating of 7/10 to right hip. Pt stated she did not get any relief from Oxycodone 10 mg. MD paged using answering service. Awaiting response for intervention.

## 2016-01-29 NOTE — Progress Notes (Signed)
Pt complained of increased pain with rating of 7/10 to right hip. 2nd page sent out to [facility] answering service. Awaiting response for further intervention.

## 2016-01-29 NOTE — Care Management Note (Signed)
Case Management Note  Patient Details  Name: Karen Watson MRN: [id number] Date of Birth: [birth year]  Subjective/Objective:         s/p right anterior THA            Action/Plan: Discharge Planning: AVS reviewed:  NCM spoke to pt and plan is to dc home with [facility]. Preoperatively arranged with [facility]. Pt has shower chair, and bedside commode. Contacted [facility] for RW for home.    Expected Discharge Date:  01/29/2016             Expected Discharge Plan:  [place]  In-House Referral:  Clinical Social Work  Discharge planning Services  CM Consult  Post Acute Care Choice:  Home Health Choice offered to:  Patient  DME Arranged:  Walker rolling DME Agency:  [place]:  PT [place]:  [facility] (now [facility])  Status of Service:  Completed, signed off  If discussed at [employer] of Stay Meetings, dates discussed:    Additional Comments:  Erenest Rasher, RN 01/29/2016, 12:34 PM

## 2016-01-29 NOTE — Discharge Summary (Signed)
Discharge Summary  Patient ID: Karen Watson MRN: [id number] DOB/AGE: [birth year] 75 y.o.  Admit date: 01/28/2016 Discharge date: 01/29/2016  Admission Diagnoses:  Primary osteoarthritis of right hip  Discharge Diagnoses:  Principal Problem:   Primary osteoarthritis of right hip Active Problems:   Anxiety state   Depression   Essential hypertension   LOW BACK PAIN   Osteoporosis   S/P lumbar laminectomy   Primary localized osteoarthritis of right hip   Past Medical History:  Diagnosis Date  . Arthritis    "thumbs, neck, shoulders, lower back" (06/08/2012)  . Basal cell carcinoma 1995-present   "face and back" (06/08/2012)  . Chronic bronchitis ([place])    last winter  . Chronic kidney disease    states she is stage 3   . Complication of anesthesia 01/2003   developed Hives, vomiting, SOB, chest heaviness after gallbladder surgery was told they believe it was due to either ancef or carbocaine  . Constipation   . Diarrhea   . DJD (degenerative joint disease)    see Dr. Charlestine Night  . Family history of anesthesia complication    daughter had surgery that didn't require vent but she ended up on one but pt has no idea why.  Lestine Mount)    Dr. Erling Cruz; "frequent; w/weather changes" (06/08/2012)  . History of shingles   . Hypertension    takes Amlodipine and Maxzide daily  . Insomnia    not on any meds  . Joint pain   . Joint swelling   . Lung cancer, lower lobe ([place]) 08/1997   "lower right lobe" (06/08/2012)  . Lung cancer, middle lobe ([place]) 06/1997   "right; inactive since 1999" (06/08/2012)  . Lung nodules    multpile  . Memory loss   . Nocturia   . Pneumonia    hx of about 5 yrs ago  . PONV (postoperative nausea and vomiting)   . Restless leg    takes tonic water at bedtime  . Restless leg   . Vertigo    doesn't take any meds  . Weakness    numbness and tingling right leg    Surgeries: Procedure(s): RIGHT TOTAL HIP ARTHROPLASTY ANTERIOR APPROACH on  01/28/2016   Consultants (if any):   Discharged Condition: Improved  Hospital Course: Karen Watson is an 75 y.o. female who was admitted 01/28/2016 with a diagnosis of Primary osteoarthritis of right hip and went to the operating room on 01/28/2016 and underwent the above named procedures.    She was given perioperative antibiotics:  Anti-infectives    Start     Dose/Rate Route Frequency Ordered Stop   01/28/16 2000  vancomycin (VANCOCIN) IVPB 750 mg/150 ml premix     750 mg 150 mL/hr over 60 Minutes Intravenous Every 12 hours 01/28/16 1606     01/28/16 1600  ceFAZolin (ANCEF) IVPB 2g/100 mL premix  Status:  Discontinued     2 g 200 mL/hr over 30 Minutes Intravenous Every 6 hours 01/28/16 1558 01/28/16 1603   01/28/16 1004  vancomycin (VANCOCIN) IVPB 1000 mg/200 mL premix     1,000 mg 200 mL/hr over 60 Minutes Intravenous On call to O.R. 01/28/16 1004 01/28/16 1133    .  She was given sequential compression devices, early ambulation, and ASA for DVT prophylaxis.  She benefited maximally from the hospital stay and there were no complications.    Recent vital signs:  Vitals:   01/29/16 0322 01/29/16 0553  BP: (!) 118/53 (!) 113/52  Pulse: 61  63  Resp: 16 16  Temp: 98.6 F (37 C) 98.5 F (36.9 C)    Recent laboratory studies:  Lab Results  Component Value Date   HGB 9.6 (L) 01/29/2016   HGB 12.6 01/17/2016   HGB 12.9 10/03/2015   Lab Results  Component Value Date   WBC 11.5 (H) 01/29/2016   PLT 200 01/29/2016   Lab Results  Component Value Date   INR 0.97 01/17/2016   Lab Results  Component Value Date   NA 136 01/29/2016   K 3.2 (L) 01/29/2016   CL 101 01/29/2016   CO2 25 01/29/2016   BUN 10 01/29/2016   CREATININE 1.03 (H) 01/29/2016   GLUCOSE 162 (H) 01/29/2016    Discharge Medications:     Medication List    TAKE these medications   acetaminophen 500 MG tablet Commonly known as:  TYLENOL Take 1,000 mg by mouth 3 (three) times daily as needed for  mild pain.   amLODipine 5 MG tablet Commonly known as:  NORVASC Take 1 tablet (5 mg total) by mouth daily.   aspirin EC 325 MG tablet Take 1 tablet (325 mg total) by mouth daily.   b complex vitamins tablet Take 1 tablet by mouth daily.   baclofen 10 MG tablet Commonly known as:  LIORESAL Take 1 tablet (10 mg total) by mouth 3 (three) times daily as needed for muscle spasms.   CALCIUM 600 + D PO Take 600 mg by mouth 2 (two) times daily.   fish oil-omega-3 fatty acids 1000 MG capsule Take 1 g by mouth daily.   gabapentin 300 MG capsule Commonly known as:  NEURONTIN Take 1 capsule by mouth 3  times daily What changed:  how much to take  how to take this  when to take this  additional instructions   MAGNESIUM-ZINC PO Take 1 tablet by mouth every other day.   meloxicam 15 MG tablet Commonly known as:  MOBIC Take 1 tablet by mouth  daily What changed:  how much to take  how to take this  when to take this  additional instructions   MULTIPLE VITAMIN PO Take 1 tablet by mouth daily.   omeprazole 20 MG capsule Commonly known as:  PRILOSEC Take 1 capsule (20 mg total) by mouth daily.   ondansetron 4 MG tablet Commonly known as:  ZOFRAN Take 1 tablet (4 mg total) by mouth every 8 (eight) hours as needed for nausea or vomiting.   oxyCODONE-acetaminophen 5-325 MG tablet Commonly known as:  ROXICET Take 1-2 tablets by mouth every 4 (four) hours as needed for severe pain.   potassium chloride SA 20 MEQ tablet Commonly known as:  K-DUR,KLOR-CON Take 1 tablet by mouth  daily What changed:  See the new instructions.   senna 8.6 MG Tabs tablet Commonly known as:  SENOKOT Take 1 tablet (8.6 mg total) by mouth 2 (two) times daily.   triamterene-hydrochlorothiazide 37.5-25 MG tablet Commonly known as:  MAXZIDE-25 Take 1 tablet by mouth  daily   vitamin C 500 MG tablet Commonly known as:  ASCORBIC ACID Take 500 mg by mouth daily as needed (in winter  months).   vitamin E 400 UNIT capsule Take 400 Units by mouth daily.       Diagnostic Studies: Dg C-arm 1-60 Min  Result Date: 01/28/2016 CLINICAL DATA:  Status post right hip arthroplasty. EXAM: OPERATIVE right HIP (WITH PELVIS IF PERFORMED) 2 VIEWS TECHNIQUE: Fluoroscopic spot image(s) were submitted for interpretation post-operatively. FLUOROSCOPY TIME:  15 seconds. COMPARISON:  None. FINDINGS: Status post right total hip arthroplasty. The femoral and acetabular components appear to be well situated. No fracture dislocation is noted expected postoperative changes are seen involving the surrounding soft tissues. IMPRESSION: Status post right total hip arthroplasty. Electronically Signed   By: Marijo Conception, M.D.   On: 01/28/2016 13:24   Dg Hip Operative Unilat W Or W/o Pelvis Right  Result Date: 01/28/2016 CLINICAL DATA:  Status post right hip arthroplasty. EXAM: OPERATIVE right HIP (WITH PELVIS IF PERFORMED) 2 VIEWS TECHNIQUE: Fluoroscopic spot image(s) were submitted for interpretation post-operatively. FLUOROSCOPY TIME:  15 seconds. COMPARISON:  None. FINDINGS: Status post right total hip arthroplasty. The femoral and acetabular components appear to be well situated. No fracture dislocation is noted expected postoperative changes are seen involving the surrounding soft tissues. IMPRESSION: Status post right total hip arthroplasty. Electronically Signed   By: Marijo Conception, M.D.   On: 01/28/2016 13:24    Disposition: 01-Home or Self Care   Signed: Prudencio Burly III PA-C 01/29/2016, 6:48 AM

## 2016-01-29 NOTE — Progress Notes (Signed)
Physical Therapy Treatment Patient Details Name: Karen Watson MRN: [id number] DOB: 18-Sep-1940 Today's Date: 01/29/2016    History of Present Illness 75 y.o. female now s/p Rt anterior THA. PMH: vertigo, restless leg, lung cancer, hypertension, HTN, lumbar fusion.     PT Comments    Patient is making good progress with PT.  From a mobility standpoint anticipate patient will be ready for DC home with family support. Patient denies any questions or concerns.     Follow Up Recommendations  Home health PT;Supervision for mobility/OOB     Equipment Recommendations  Rolling walker with 5" wheels    Recommendations for Other Services       Precautions / Restrictions Precautions Precautions: Anterior Hip;Fall Precaution Comments: pt recalling 3:3 precautions Restrictions Weight Bearing Restrictions: Yes RLE Weight Bearing: Weight bearing as tolerated    Mobility  Bed Mobility Overal bed mobility: Needs Assistance Bed Mobility: Supine to Sit     Supine to sit: Supervision        Transfers Overall transfer level: Needs assistance Equipment used: Rolling walker (2 wheeled) Transfers: Sit to/from Stand Sit to Stand: Supervision         General transfer comment: good technique  Ambulation/Gait Ambulation/Gait assistance: Supervision Ambulation Distance (Feet): 175 Feet Assistive device: Rolling walker (2 wheeled) Gait Pattern/deviations: Step-through pattern Gait velocity: decreased   General Gait Details: cues for even strides and stance time.    Stairs Stairs: Yes   Stair Management: One rail Right;Sideways Number of Stairs: 5 General stair comments: Pt and spouse confirming that pt can safety use post on stairs for support, similar to rails utilized during session. Pt and spouse report feeling confident with ability to perform stairs at home.   Wheelchair Mobility    Modified Rankin (Stroke Patients Only)       Balance Overall balance assessment:  Needs assistance Sitting-balance support: No upper extremity supported Sitting balance-Leahy Scale: Good     Standing balance support: During functional activity Standing balance-Leahy Scale: Fair                      Cognition Arousal/Alertness: Awake/alert Behavior During Therapy: WFL for tasks assessed/performed Overall Cognitive Status: Within Functional Limits for tasks assessed                      Exercises Total Joint Exercises Ankle Circles/Pumps: AROM;Both;10 reps Quad Sets: Strengthening;Right;10 reps Short Arc Quad: Strengthening;Right;10 reps Heel Slides: AAROM;Right;10 reps Long Arc Quad: Strengthening;Right;10 reps    General Comments        Pertinent Vitals/Pain Pain Assessment: 0-10 Pain Score: 2  Pain Location: Rt hip Pain Descriptors / Indicators: Sore Pain Intervention(s): Monitored during session;Limited activity within patient's tolerance    Home Living                      Prior Function            PT Goals (current goals can now be found in the care plan section) Acute Rehab PT Goals Patient Stated Goal: be active PT Goal Formulation: With patient Time For Goal Achievement: 02/12/16 Potential to Achieve Goals: Good Progress towards PT goals: Progressing toward goals    Frequency  7X/week    PT Plan Current plan remains appropriate    Co-evaluation             End of Session Equipment Utilized During Treatment: Gait belt Activity Tolerance: Patient tolerated treatment well Patient  left: with call bell/phone within reach;in chair;with family/visitor present     Time: 1526-1600 PT Time Calculation (min) (ACUTE ONLY): 34 min  Charges:  $Gait Training: 8-22 mins $Therapeutic Exercise: 8-22 mins                    G Codes:      Cassell Clement, PT, CSCS Pager [phone] Office [phone]  01/29/2016, 4:07 PM

## 2016-01-29 NOTE — Evaluation (Signed)
Physical Therapy Evaluation Patient Details Name: Karen Watson MRN: [id number] DOB: Nov 15, 1940 Today's Date: 01/29/2016   History of Present Illness  75 y.o. female now s/p Rt anterior THA. PMH: vertigo, restless leg, lung cancer, hypertension, HTN, lumbar fusion.   Clinical Impression  Pt is s/p anterior THA resulting in the deficits listed below (see PT Problem List). Pt able to ambulate 140 ft with rw and min guard assist. Pt will benefit from skilled PT to increase their independence and safety with mobility to allow discharge to home with family assist. Pt reports being uncertain if she will be able to return home today or not. Will have a better idea following second PT session.       Follow Up Recommendations Home health PT;Supervision for mobility/OOB    Equipment Recommendations  Rolling walker with 5" wheels    Recommendations for Other Services       Precautions / Restrictions Precautions Precautions: Anterior Hip;Fall Precaution Booklet Issued: Yes (comment) Precaution Comments: HEP/precautions provided. Reviewed ant. THA precautions.  Restrictions Weight Bearing Restrictions: Yes RLE Weight Bearing: Weight bearing as tolerated      Mobility  Bed Mobility               General bed mobility comments: in chair upon arrival  Transfers Overall transfer level: Needs assistance Equipment used: Rolling walker (2 wheeled) Transfers: Sit to/from Stand Sit to Stand: Min guard         General transfer comment: good technique once verbally reviewed  Ambulation/Gait Ambulation/Gait assistance: Min guard Ambulation Distance (Feet): 140 Feet Assistive device: Rolling walker (2 wheeled) Gait Pattern/deviations: Step-to pattern;Step-through pattern Gait velocity: decreased   General Gait Details: cues for even strides and increased stance time on Rt LE. Pt occasionally letting go of rw while talking with decreased stability.   Stairs             Wheelchair Mobility    Modified Rankin (Stroke Patients Only)       Balance Overall balance assessment: Needs assistance Sitting-balance support: No upper extremity supported Sitting balance-Leahy Scale: Good     Standing balance support: Bilateral upper extremity supported Standing balance-Leahy Scale: Poor Standing balance comment: using rw                             Pertinent Vitals/Pain Pain Assessment: No/denies pain    Home Living Family/patient expects to be discharged to:: Private residence Living Arrangements: Spouse/significant other Available Help at Discharge: Available 24 hours/day;Family Type of Home: House Home Access: Stairs to enter Entrance Stairs-Rails: None (2 posts on each side) Entrance Stairs-Number of Steps: 2 Home Layout: One level Home Equipment: Cane - single point Additional Comments: daughter-in-law can stay with her if husband needs help.     Prior Function Level of Independence: Independent with assistive device(s)         Comments: using cane for ambulation. Husband assisted with donning socks and shoes     Hand Dominance        Extremity/Trunk Assessment   Upper Extremity Assessment: Defer to OT evaluation           Lower Extremity Assessment: RLE deficits/detail RLE Deficits / Details: decreased active motion due to reports of tenderness and stiffness.        Communication   Communication: No difficulties  Cognition Arousal/Alertness: Awake/alert Behavior During Therapy: WFL for tasks assessed/performed Overall Cognitive Status: Within Functional Limits for tasks assessed  General Comments      Exercises        Assessment/Plan    PT Assessment Patient needs continued PT services  PT Diagnosis Difficulty walking   PT Problem List Decreased strength;Decreased range of motion;Decreased activity tolerance;Decreased balance;Decreased mobility  PT Treatment  Interventions Stair training;DME instruction;Gait training;Functional mobility training;Therapeutic activities;Therapeutic exercise;Patient/family education   PT Goals (Current goals can be found in the Care Plan section) Acute Rehab PT Goals Patient Stated Goal: eventually get back to swimming PT Goal Formulation: With patient Time For Goal Achievement: 02/12/16 Potential to Achieve Goals: Good    Frequency 7X/week   Barriers to discharge        Co-evaluation               End of Session Equipment Utilized During Treatment: Gait belt Activity Tolerance: Patient tolerated treatment well Patient left: in chair;with call bell/phone within reach;with family/visitor present (ice provided) Nurse Communication: Mobility status;Precautions;Weight bearing status    Functional Assessment Tool Used: clinical judgment Functional Limitation: Mobility: Walking and moving around Mobility: Walking and Moving Around Current Status [phone]): At least 20 percent but less than 40 percent impaired, limited or restricted Mobility: Walking and Moving Around Goal Status [phone]): At least 1 percent but less than 20 percent impaired, limited or restricted    Time: 0911-0946 PT Time Calculation (min) (ACUTE ONLY): 35 min   Charges:   PT Evaluation $PT Eval Moderate Complexity: 1 Procedure PT Treatments $Gait Training: 8-22 mins   PT G Codes:   PT G-Codes **NOT FOR INPATIENT CLASS** Functional Assessment Tool Used: clinical judgment Functional Limitation: Mobility: Walking and moving around Mobility: Walking and Moving Around Current Status ([id number]): At least 20 percent but less than 40 percent impaired, limited or restricted Mobility: Walking and Moving Around Goal Status [phone]): At least 1 percent but less than 20 percent impaired, limited or restricted    Cassell Clement, PT, CSCS Pager [phone] Office [phone]  01/29/2016, 10:00 AM

## 2016-01-29 NOTE — Progress Notes (Signed)
Attempted to go over pt's discharge education/instructions but pt stated that she would feel more comfortable staying one more night to "get another pain shot and a good night's sleep". Reeducated pt that IV pain medicine is used for breakthrough pain only, and only after oral pain has been administered first. Pt verbalized understanding but still insisted on staying another night. Notified Bridgette Habermann with update and was told pt could stay another night if she truly felt she needed it, but that she would not just receive IV pain medicine because she wanted it. Pt updated and verbalized understanding of need for oral vs IV pain medication, and still insisted on staying another night. Pt currently resting in bed with husband at bedside. Will continue to monitor

## 2016-01-30 LAB — BASIC METABOLIC PANEL
Anion gap: 9 (ref 5–15)
BUN: 19 mg/dL (ref 6–20)
CALCIUM: 9.4 mg/dL (ref 8.9–10.3)
CO2: 28 mmol/L (ref 22–32)
CREATININE: 1.05 mg/dL — AB (ref 0.44–1.00)
Chloride: 102 mmol/L (ref 101–111)
GFR, EST AFRICAN AMERICAN: 59 mL/min — AB (ref >60)
GFR, EST NON AFRICAN AMERICAN: 51 mL/min — AB (ref >60)
GLUCOSE: 121 mg/dL — AB (ref 65–99)
POTASSIUM: 4.3 mmol/L (ref 3.5–5.1)
SODIUM: 139 mmol/L (ref 135–145)

## 2016-01-30 LAB — CBC
HEMATOCRIT: 31.5 % — AB (ref 36.0–46.0)
HEMOGLOBIN: 9.8 g/dL — AB (ref 12.0–15.0)
MCH: 30.7 pg (ref 26.0–34.0)
MCHC: 31.1 g/dL (ref 30.0–36.0)
MCV: 98.7 fL (ref 78.0–100.0)
Platelets: 227 10*3/uL (ref 150–400)
RBC: 3.19 MIL/uL — AB (ref 3.87–5.11)
RDW: 14.6 % (ref 11.5–15.5)
WBC: 17.9 10*3/uL — ABNORMAL HIGH (ref 4.0–10.5)

## 2016-01-30 NOTE — Progress Notes (Signed)
   Assessment: 2 Days Post-Op  S/P Procedure(s) (LRB): RIGHT TOTAL HIP ARTHROPLASTY ANTERIOR APPROACH (Right) by Dr. Ernesta Amble. Percell Watson on 01/28/16  Principal Problem:   Primary osteoarthritis of right hip Active Problems:   Anxiety state   Depression   Essential hypertension   LOW BACK PAIN   Osteoporosis   S/P lumbar laminectomy   Primary localized osteoarthritis of right hip  ADDITIONAL DIAGNOSIS:  Acute Blood loss anemia - likely with dilutional component.  H/H Stable  Hypokalemia - resolved  Plan: Discharge home with home health  Weight Bearing: Weight Bearing as Tolerated (WBAT) Right leg - Anterior Hip Precautions. Dressings: prn VTE prophylaxis: Aspirin, SCDs, ambulation Dispo: Home    Subjective: Patient feels ready to discharge.  Patient reports pain as mild. Pain controlled with PO meds.  Tolerating diet.  Urinating. +Flatus, No CP, SOB.  OOB w/ PT.  Objective:   VITALS:   Vitals:   01/29/16 0553 01/29/16 1500 01/29/16 2043 01/30/16 0500  BP: (!) 113/52 (!) 109/43 (!) 109/48 (!) 113/50  Pulse: 63 69 77 69  Resp: '16 17 18 18  '$ Temp: 98.5 F (36.9 C) 98.7 F (37.1 C) 98.3 F (36.8 C) 97.7 F (36.5 C)  TempSrc: Oral Oral Oral Oral  SpO2: 96% 98% 96% 98%  Weight:      Height:        Lab Results  Component Value Date   WBC 17.9 (H) 01/30/2016   HGB 9.8 (L) 01/30/2016   HCT 31.5 (L) 01/30/2016   MCV 98.7 01/30/2016   PLT 227 01/30/2016   BMET    Component Value Date/Time   NA 139 01/30/2016 0418   K 4.3 01/30/2016 0418   CL 102 01/30/2016 0418   CO2 28 01/30/2016 0418   GLUCOSE 121 (H) 01/30/2016 0418   BUN 19 01/30/2016 0418   CREATININE 1.05 (H) 01/30/2016 0418   CALCIUM 9.4 01/30/2016 0418   GFRNONAA 51 (L) 01/30/2016 0418   GFRAA 59 (L) 01/30/2016 0418    General: NAD.  Sitting upright in bed. Resp: No increased WOB Cardio: RRR ABD soft Neurologically intact MSK Neurovascularly intact Intact pulses distally Dorsiflexion/Plantar  flexion intact Incision: dressing C/D/I,  Mild surrounding erythema.   Karen Watson 01/30/2016, 6:55 AM

## 2016-01-30 NOTE — Discharge Summary (Signed)
Discharge Summary  Patient ID: Karen Watson MRN: [id number] DOB/AGE: [birth year] 75 y.o.  Admit date: 01/28/2016 Discharge date: 01/30/2016  Admission Diagnoses:  Primary osteoarthritis of right hip  Discharge Diagnoses:  Principal Problem:   Primary osteoarthritis of right hip Active Problems:   Anxiety state   Depression   Essential hypertension   LOW BACK PAIN   Osteoporosis   S/P lumbar laminectomy   Primary localized osteoarthritis of right hip   Past Medical History:  Diagnosis Date  . Arthritis    "thumbs, neck, shoulders, lower back" (06/08/2012)  . Basal cell carcinoma 1995-present   "face and back" (06/08/2012)  . Chronic bronchitis ([place])    last winter  . Chronic kidney disease    states she is stage 3   . Complication of anesthesia 01/2003   developed Hives, vomiting, SOB, chest heaviness after gallbladder surgery was told they believe it was due to either ancef or carbocaine  . Constipation   . Diarrhea   . DJD (degenerative joint disease)    see Dr. Charlestine Night  . Family history of anesthesia complication    daughter had surgery that didn't require vent but she ended up on one but pt has no idea why.  Lestine Mount)    Dr. Erling Cruz; "frequent; w/weather changes" (06/08/2012)  . History of shingles   . Hypertension    takes Amlodipine and Maxzide daily  . Insomnia    not on any meds  . Joint pain   . Joint swelling   . Lung cancer, lower lobe ([place]) 08/1997   "lower right lobe" (06/08/2012)  . Lung cancer, middle lobe ([place]) 06/1997   "right; inactive since 1999" (06/08/2012)  . Lung nodules    multpile  . Memory loss   . Nocturia   . Pneumonia    hx of about 5 yrs ago  . PONV (postoperative nausea and vomiting)   . Restless leg    takes tonic water at bedtime  . Restless leg   . Vertigo    doesn't take any meds  . Weakness    numbness and tingling right leg    Surgeries: Procedure(s): RIGHT TOTAL HIP ARTHROPLASTY ANTERIOR APPROACH on  01/28/2016   Consultants (if any):   Discharged Condition: Improved  Plan: Discharge home with home health Weight Bearing: Weight Bearing as Tolerated (WBAT) Right leg - Anterior Hip Precautions. Dressings: prn VTE prophylaxis: Aspirin, SCDs, ambulation Dispo: Home    Subjective: Patient feels ready to discharge.  Patient reports pain as mild. Pain controlled with PO meds.  Tolerating diet.  Urinating. +Flatus, No CP, SOB.  OOB w/ PT.  Objective:  General: NAD.  Sitting upright in bed. Resp: No increased WOB Cardio: RRR ABD soft Neurologically intact MSK Neurovascularly intact Intact pulses distally Dorsiflexion/Plantar flexion intact Incision: dressing C/D/I,  Mild surrounding erythema.   Hospital Course: Karen Watson is an 75 y.o. female who was admitted 01/28/2016 with a diagnosis of Primary osteoarthritis of right hip and went to the operating room on 01/28/2016 and underwent the above named procedures.    She was given perioperative antibiotics:  Anti-infectives    Start     Dose/Rate Route Frequency Ordered Stop   01/28/16 2000  vancomycin (VANCOCIN) IVPB 750 mg/150 ml premix  Status:  Discontinued     750 mg 150 mL/hr over 60 Minutes Intravenous Every 12 hours 01/28/16 1606 01/29/16 0917   01/28/16 1600  ceFAZolin (ANCEF) IVPB 2g/100 mL premix  Status:  Discontinued  2 g 200 mL/hr over 30 Minutes Intravenous Every 6 hours 01/28/16 1558 01/28/16 1603   01/28/16 1004  vancomycin (VANCOCIN) IVPB 1000 mg/200 mL premix     1,000 mg 200 mL/hr over 60 Minutes Intravenous On call to O.R. 01/28/16 1004 01/28/16 1133    .  She was given sequential compression devices, early ambulation, and ASA for DVT prophylaxis.  She benefited maximally from the hospital stay and there were no complications.    Recent vital signs:  Vitals:   01/29/16 2043 01/30/16 0500  BP: (!) 109/48 (!) 113/50  Pulse: 77 69  Resp: 18 18  Temp: 98.3 F (36.8 C) 97.7 F (36.5 C)    Recent  laboratory studies:  Lab Results  Component Value Date   HGB 9.8 (L) 01/30/2016   HGB 9.6 (L) 01/29/2016   HGB 12.6 01/17/2016   Lab Results  Component Value Date   WBC 17.9 (H) 01/30/2016   PLT 227 01/30/2016   Lab Results  Component Value Date   INR 0.97 01/17/2016   Lab Results  Component Value Date   NA 139 01/30/2016   K 4.3 01/30/2016   CL 102 01/30/2016   CO2 28 01/30/2016   BUN 19 01/30/2016   CREATININE 1.05 (H) 01/30/2016   GLUCOSE 121 (H) 01/30/2016    Discharge Medications:     Medication List    TAKE these medications   acetaminophen 500 MG tablet Commonly known as:  TYLENOL Take 1,000 mg by mouth 3 (three) times daily as needed for mild pain.   amLODipine 5 MG tablet Commonly known as:  NORVASC Take 1 tablet (5 mg total) by mouth daily.   aspirin EC 325 MG tablet Take 1 tablet (325 mg total) by mouth daily.   b complex vitamins tablet Take 1 tablet by mouth daily.   baclofen 10 MG tablet Commonly known as:  LIORESAL Take 1 tablet (10 mg total) by mouth 3 (three) times daily as needed for muscle spasms.   CALCIUM 600 + D PO Take 600 mg by mouth 2 (two) times daily.   fish oil-omega-3 fatty acids 1000 MG capsule Take 1 g by mouth daily.   gabapentin 300 MG capsule Commonly known as:  NEURONTIN Take 1 capsule by mouth 3  times daily What changed:  how much to take  how to take this  when to take this  additional instructions   MAGNESIUM-ZINC PO Take 1 tablet by mouth every other day.   meloxicam 15 MG tablet Commonly known as:  MOBIC Take 1 tablet by mouth  daily What changed:  how much to take  how to take this  when to take this  additional instructions   MULTIPLE VITAMIN PO Take 1 tablet by mouth daily.   omeprazole 20 MG capsule Commonly known as:  PRILOSEC Take 1 capsule (20 mg total) by mouth daily.   ondansetron 4 MG tablet Commonly known as:  ZOFRAN Take 1 tablet (4 mg total) by mouth every 8 (eight)  hours as needed for nausea or vomiting.   oxyCODONE-acetaminophen 5-325 MG tablet Commonly known as:  ROXICET Take 1-2 tablets by mouth every 4 (four) hours as needed for severe pain.   potassium chloride SA 20 MEQ tablet Commonly known as:  K-DUR,KLOR-CON Take 1 tablet by mouth  daily What changed:  See the new instructions.   senna 8.6 MG Tabs tablet Commonly known as:  SENOKOT Take 1 tablet (8.6 mg total) by mouth 2 (two) times daily.  triamterene-hydrochlorothiazide 37.5-25 MG tablet Commonly known as:  MAXZIDE-25 Take 1 tablet by mouth  daily   vitamin C 500 MG tablet Commonly known as:  ASCORBIC ACID Take 500 mg by mouth daily as needed (in winter months).   vitamin E 400 UNIT capsule Take 400 Units by mouth daily.       Diagnostic Studies: Dg C-arm 1-60 Min  Result Date: 01/28/2016 CLINICAL DATA:  Status post right hip arthroplasty. EXAM: OPERATIVE right HIP (WITH PELVIS IF PERFORMED) 2 VIEWS TECHNIQUE: Fluoroscopic spot image(s) were submitted for interpretation post-operatively. FLUOROSCOPY TIME:  15 seconds. COMPARISON:  None. FINDINGS: Status post right total hip arthroplasty. The femoral and acetabular components appear to be well situated. No fracture dislocation is noted expected postoperative changes are seen involving the surrounding soft tissues. IMPRESSION: Status post right total hip arthroplasty. Electronically Signed   By: Marijo Conception, M.D.   On: 01/28/2016 13:24   Dg Hip Operative Unilat W Or W/o Pelvis Right  Result Date: 01/28/2016 CLINICAL DATA:  Status post right hip arthroplasty. EXAM: OPERATIVE right HIP (WITH PELVIS IF PERFORMED) 2 VIEWS TECHNIQUE: Fluoroscopic spot image(s) were submitted for interpretation post-operatively. FLUOROSCOPY TIME:  15 seconds. COMPARISON:  None. FINDINGS: Status post right total hip arthroplasty. The femoral and acetabular components appear to be well situated. No fracture dislocation is noted expected postoperative  changes are seen involving the surrounding soft tissues. IMPRESSION: Status post right total hip arthroplasty. Electronically Signed   By: Marijo Conception, M.D.   On: 01/28/2016 13:24    Disposition: 01-Home or Self Care   Signed: Prudencio Burly III PA-C 01/30/2016, 6:58 AM

## 2016-01-30 NOTE — Progress Notes (Signed)
Pt ready for discharge. Education/instructions reviewed with pt and husband, and all questions/concerns addressed. IV removed and belongings gathered. Pt will be transported out via wheelchair to husband's car. Will continue to monitor

## 2016-01-30 NOTE — Progress Notes (Signed)
Physical Therapy Treatment Patient Details Name: Karen Watson MRN: [id number] DOB: [birth year] Today's Date: 01/30/2016    History of Present Illness 75 y.o. female now s/p Rt anterior THA. PMH: vertigo, restless leg, lung cancer, hypertension, HTN, lumbar fusion.     PT Comments    Karen Watson made excellent progress today, ambulating 500 ft with her RW with supervision.  She completed therapeutic exercises and recalled 3/3 hip precautions.  Pt will benefit from continued skilled PT services to increase functional independence and safety.   Follow Up Recommendations  Home health PT;Supervision for mobility/OOB     Equipment Recommendations  Rolling walker with 5" wheels    Recommendations for Other Services       Precautions / Restrictions Precautions Precautions: Anterior Hip;Fall Precaution Comments: pt recalled 3/3 precautions without cues Restrictions Weight Bearing Restrictions: Yes RLE Weight Bearing: Weight bearing as tolerated    Mobility  Bed Mobility               General bed mobility comments: Pt sitting in chair upon PT arrival  Transfers Overall transfer level: Needs assistance Equipment used: Rolling walker (2 wheeled) Transfers: Sit to/from Stand Sit to Stand: Supervision         General transfer comment: safe technique without cues  Ambulation/Gait Ambulation/Gait assistance: Supervision Ambulation Distance (Feet): 500 Feet Assistive device: Rolling walker (2 wheeled) Gait Pattern/deviations: Step-to pattern;Step-through pattern;Decreased stance time - right;Decreased step length - left;Decreased stride length;Antalgic Gait velocity: decreased   General Gait Details: cues for even strides and stance time.    Stairs            Wheelchair Mobility    Modified Rankin (Stroke Patients Only)       Balance Overall balance assessment: Needs assistance Sitting-balance support: No upper extremity supported;Feet supported Sitting  balance-Leahy Scale: Good     Standing balance support: No upper extremity supported;During functional activity Standing balance-Leahy Scale: Fair Standing balance comment: Pt able to stand statically without UE support but requires UE support for dynamic activities                    Cognition Arousal/Alertness: Awake/alert Behavior During Therapy: WFL for tasks assessed/performed Overall Cognitive Status: Within Functional Limits for tasks assessed                      Exercises Total Joint Exercises Long Arc Quad: Strengthening;Right;Other (comment);15 reps;AROM (5 reps with light manual resistance Extension and Flexion) Knee Flexion: AROM;Both;10 reps;Standing General Exercises - Lower Extremity Mini-Sqauts: Strengthening;Other (comment);Standing (8 reps with UEs supported on counter)    General Comments        Pertinent Vitals/Pain Pain Assessment: Faces Faces Pain Scale: Hurts little more Pain Location: Rt hip Pain Descriptors / Indicators: Sharp Pain Intervention(s): Limited activity within patient's tolerance;Monitored during session;Repositioned    Home Living                      Prior Function            PT Goals (current goals can now be found in the care plan section) Acute Rehab PT Goals Patient Stated Goal: to get back to being active with church activity, spending time with her grandchildren PT Goal Formulation: With patient Time For Goal Achievement: 02/12/16 Potential to Achieve Goals: Good Progress towards PT goals: Progressing toward goals    Frequency  7X/week    PT Plan Current plan remains appropriate  Co-evaluation             End of Session Equipment Utilized During Treatment: Gait belt Activity Tolerance: Patient tolerated treatment well Patient left: with call bell/phone within reach;in chair;with family/visitor present     Time: 1410-1430 PT Time Calculation (min) (ACUTE ONLY): 20 min  Charges:   $Therapeutic Exercise: 8-22 mins                    G Codes:      Karen Watson PT, DPT  Pager: [phone] Phone: [phone] 01/30/2016, 3:58 PM

## 2016-02-01 DIAGNOSIS — Z9889 Other specified postprocedural states: Secondary | ICD-10-CM | POA: Diagnosis not present

## 2016-02-01 DIAGNOSIS — Z96641 Presence of right artificial hip joint: Secondary | ICD-10-CM | POA: Diagnosis not present

## 2016-02-01 DIAGNOSIS — Z7982 Long term (current) use of aspirin: Secondary | ICD-10-CM | POA: Diagnosis not present

## 2016-02-01 DIAGNOSIS — Z85118 Personal history of other malignant neoplasm of bronchus and lung: Secondary | ICD-10-CM | POA: Diagnosis not present

## 2016-02-01 DIAGNOSIS — Z471 Aftercare following joint replacement surgery: Secondary | ICD-10-CM | POA: Diagnosis not present

## 2016-02-01 DIAGNOSIS — M1991 Primary osteoarthritis, unspecified site: Secondary | ICD-10-CM | POA: Diagnosis not present

## 2016-02-01 DIAGNOSIS — M4696 Unspecified inflammatory spondylopathy, lumbar region: Secondary | ICD-10-CM | POA: Diagnosis not present

## 2016-02-01 DIAGNOSIS — Z79891 Long term (current) use of opiate analgesic: Secondary | ICD-10-CM | POA: Diagnosis not present

## 2016-02-01 DIAGNOSIS — M810 Age-related osteoporosis without current pathological fracture: Secondary | ICD-10-CM | POA: Diagnosis not present

## 2016-02-01 DIAGNOSIS — N182 Chronic kidney disease, stage 2 (mild): Secondary | ICD-10-CM | POA: Diagnosis not present

## 2016-02-01 DIAGNOSIS — I129 Hypertensive chronic kidney disease with stage 1 through stage 4 chronic kidney disease, or unspecified chronic kidney disease: Secondary | ICD-10-CM | POA: Diagnosis not present

## 2016-02-02 DIAGNOSIS — I129 Hypertensive chronic kidney disease with stage 1 through stage 4 chronic kidney disease, or unspecified chronic kidney disease: Secondary | ICD-10-CM | POA: Diagnosis not present

## 2016-02-02 DIAGNOSIS — Z85118 Personal history of other malignant neoplasm of bronchus and lung: Secondary | ICD-10-CM | POA: Diagnosis not present

## 2016-02-02 DIAGNOSIS — Z471 Aftercare following joint replacement surgery: Secondary | ICD-10-CM | POA: Diagnosis not present

## 2016-02-02 DIAGNOSIS — Z96641 Presence of right artificial hip joint: Secondary | ICD-10-CM | POA: Diagnosis not present

## 2016-02-02 DIAGNOSIS — Z7982 Long term (current) use of aspirin: Secondary | ICD-10-CM | POA: Diagnosis not present

## 2016-02-02 DIAGNOSIS — M4696 Unspecified inflammatory spondylopathy, lumbar region: Secondary | ICD-10-CM | POA: Diagnosis not present

## 2016-02-02 DIAGNOSIS — Z79891 Long term (current) use of opiate analgesic: Secondary | ICD-10-CM | POA: Diagnosis not present

## 2016-02-02 DIAGNOSIS — M1991 Primary osteoarthritis, unspecified site: Secondary | ICD-10-CM | POA: Diagnosis not present

## 2016-02-02 DIAGNOSIS — M810 Age-related osteoporosis without current pathological fracture: Secondary | ICD-10-CM | POA: Diagnosis not present

## 2016-02-02 DIAGNOSIS — N182 Chronic kidney disease, stage 2 (mild): Secondary | ICD-10-CM | POA: Diagnosis not present

## 2016-02-02 DIAGNOSIS — Z9889 Other specified postprocedural states: Secondary | ICD-10-CM | POA: Diagnosis not present

## 2016-02-03 ENCOUNTER — Telehealth: Payer: Self-pay | Admitting: Family Medicine

## 2016-02-03 NOTE — Telephone Encounter (Signed)
Mrs Karen Watson, a friend with the family is helping pt after recent hip surgery . Pt and family had asked her to call this morning to give an update. Pt had surgery on hip last tues.   Pt had some reaction to her pain meds on Friday afternoon. Was fine until then.  Pt was not herself when she woke up on Friday. Pt did not know where she was, acting strange, seeing things and questioning herself. PT came Sat am, and dc'd her oxycodone (last one Sat midnight)  However, pt is still not quite right.  Pt still not thinking straight, nightmares.  They have also left a message for Dr [name] as well who did the surgery.  Also no BM since the surgery.

## 2016-02-03 NOTE — Telephone Encounter (Signed)
I spoke with pt and she has received a call from surgeon and was advised on the below message, they took care of the problem and gave pt advice.

## 2016-02-03 NOTE — Telephone Encounter (Signed)
Mental status changes and constipation are common side effects of pain medications. They should speak to her surgeon about changing these.

## 2016-02-04 ENCOUNTER — Encounter: Payer: Self-pay | Admitting: Family Medicine

## 2016-02-04 DIAGNOSIS — Z9889 Other specified postprocedural states: Secondary | ICD-10-CM | POA: Diagnosis not present

## 2016-02-04 DIAGNOSIS — M1991 Primary osteoarthritis, unspecified site: Secondary | ICD-10-CM | POA: Diagnosis not present

## 2016-02-04 DIAGNOSIS — Z85118 Personal history of other malignant neoplasm of bronchus and lung: Secondary | ICD-10-CM | POA: Diagnosis not present

## 2016-02-04 DIAGNOSIS — M4696 Unspecified inflammatory spondylopathy, lumbar region: Secondary | ICD-10-CM | POA: Diagnosis not present

## 2016-02-04 DIAGNOSIS — Z79891 Long term (current) use of opiate analgesic: Secondary | ICD-10-CM | POA: Diagnosis not present

## 2016-02-04 DIAGNOSIS — N182 Chronic kidney disease, stage 2 (mild): Secondary | ICD-10-CM | POA: Diagnosis not present

## 2016-02-04 DIAGNOSIS — Z96641 Presence of right artificial hip joint: Secondary | ICD-10-CM | POA: Diagnosis not present

## 2016-02-04 DIAGNOSIS — M810 Age-related osteoporosis without current pathological fracture: Secondary | ICD-10-CM | POA: Diagnosis not present

## 2016-02-04 DIAGNOSIS — Z7982 Long term (current) use of aspirin: Secondary | ICD-10-CM | POA: Diagnosis not present

## 2016-02-04 DIAGNOSIS — Z471 Aftercare following joint replacement surgery: Secondary | ICD-10-CM | POA: Diagnosis not present

## 2016-02-04 DIAGNOSIS — I129 Hypertensive chronic kidney disease with stage 1 through stage 4 chronic kidney disease, or unspecified chronic kidney disease: Secondary | ICD-10-CM | POA: Diagnosis not present

## 2016-02-05 DIAGNOSIS — M4696 Unspecified inflammatory spondylopathy, lumbar region: Secondary | ICD-10-CM | POA: Diagnosis not present

## 2016-02-05 DIAGNOSIS — Z471 Aftercare following joint replacement surgery: Secondary | ICD-10-CM | POA: Diagnosis not present

## 2016-02-05 DIAGNOSIS — N182 Chronic kidney disease, stage 2 (mild): Secondary | ICD-10-CM | POA: Diagnosis not present

## 2016-02-05 DIAGNOSIS — M1991 Primary osteoarthritis, unspecified site: Secondary | ICD-10-CM | POA: Diagnosis not present

## 2016-02-05 DIAGNOSIS — M810 Age-related osteoporosis without current pathological fracture: Secondary | ICD-10-CM | POA: Diagnosis not present

## 2016-02-05 DIAGNOSIS — I129 Hypertensive chronic kidney disease with stage 1 through stage 4 chronic kidney disease, or unspecified chronic kidney disease: Secondary | ICD-10-CM | POA: Diagnosis not present

## 2016-02-05 DIAGNOSIS — Z7982 Long term (current) use of aspirin: Secondary | ICD-10-CM | POA: Diagnosis not present

## 2016-02-05 DIAGNOSIS — Z85118 Personal history of other malignant neoplasm of bronchus and lung: Secondary | ICD-10-CM | POA: Diagnosis not present

## 2016-02-05 DIAGNOSIS — Z9889 Other specified postprocedural states: Secondary | ICD-10-CM | POA: Diagnosis not present

## 2016-02-05 DIAGNOSIS — Z79891 Long term (current) use of opiate analgesic: Secondary | ICD-10-CM | POA: Diagnosis not present

## 2016-02-05 DIAGNOSIS — Z96641 Presence of right artificial hip joint: Secondary | ICD-10-CM | POA: Diagnosis not present

## 2016-02-07 DIAGNOSIS — Z7982 Long term (current) use of aspirin: Secondary | ICD-10-CM | POA: Diagnosis not present

## 2016-02-07 DIAGNOSIS — Z9889 Other specified postprocedural states: Secondary | ICD-10-CM | POA: Diagnosis not present

## 2016-02-07 DIAGNOSIS — Z471 Aftercare following joint replacement surgery: Secondary | ICD-10-CM | POA: Diagnosis not present

## 2016-02-07 DIAGNOSIS — Z79891 Long term (current) use of opiate analgesic: Secondary | ICD-10-CM | POA: Diagnosis not present

## 2016-02-07 DIAGNOSIS — Z85118 Personal history of other malignant neoplasm of bronchus and lung: Secondary | ICD-10-CM | POA: Diagnosis not present

## 2016-02-07 DIAGNOSIS — M810 Age-related osteoporosis without current pathological fracture: Secondary | ICD-10-CM | POA: Diagnosis not present

## 2016-02-07 DIAGNOSIS — M1991 Primary osteoarthritis, unspecified site: Secondary | ICD-10-CM | POA: Diagnosis not present

## 2016-02-07 DIAGNOSIS — Z96641 Presence of right artificial hip joint: Secondary | ICD-10-CM | POA: Diagnosis not present

## 2016-02-07 DIAGNOSIS — M4696 Unspecified inflammatory spondylopathy, lumbar region: Secondary | ICD-10-CM | POA: Diagnosis not present

## 2016-02-07 DIAGNOSIS — N182 Chronic kidney disease, stage 2 (mild): Secondary | ICD-10-CM | POA: Diagnosis not present

## 2016-02-07 DIAGNOSIS — I129 Hypertensive chronic kidney disease with stage 1 through stage 4 chronic kidney disease, or unspecified chronic kidney disease: Secondary | ICD-10-CM | POA: Diagnosis not present

## 2016-02-11 DIAGNOSIS — Z79891 Long term (current) use of opiate analgesic: Secondary | ICD-10-CM | POA: Diagnosis not present

## 2016-02-11 DIAGNOSIS — Z7982 Long term (current) use of aspirin: Secondary | ICD-10-CM | POA: Diagnosis not present

## 2016-02-11 DIAGNOSIS — Z85118 Personal history of other malignant neoplasm of bronchus and lung: Secondary | ICD-10-CM | POA: Diagnosis not present

## 2016-02-11 DIAGNOSIS — Z471 Aftercare following joint replacement surgery: Secondary | ICD-10-CM | POA: Diagnosis not present

## 2016-02-11 DIAGNOSIS — I129 Hypertensive chronic kidney disease with stage 1 through stage 4 chronic kidney disease, or unspecified chronic kidney disease: Secondary | ICD-10-CM | POA: Diagnosis not present

## 2016-02-11 DIAGNOSIS — Z9889 Other specified postprocedural states: Secondary | ICD-10-CM | POA: Diagnosis not present

## 2016-02-11 DIAGNOSIS — M4696 Unspecified inflammatory spondylopathy, lumbar region: Secondary | ICD-10-CM | POA: Diagnosis not present

## 2016-02-11 DIAGNOSIS — N182 Chronic kidney disease, stage 2 (mild): Secondary | ICD-10-CM | POA: Diagnosis not present

## 2016-02-11 DIAGNOSIS — M810 Age-related osteoporosis without current pathological fracture: Secondary | ICD-10-CM | POA: Diagnosis not present

## 2016-02-11 DIAGNOSIS — M1991 Primary osteoarthritis, unspecified site: Secondary | ICD-10-CM | POA: Diagnosis not present

## 2016-02-11 DIAGNOSIS — Z96641 Presence of right artificial hip joint: Secondary | ICD-10-CM | POA: Diagnosis not present

## 2016-02-12 ENCOUNTER — Ambulatory Visit (INDEPENDENT_AMBULATORY_CARE_PROVIDER_SITE_OTHER): Payer: Medicare Other | Admitting: Family Medicine

## 2016-02-12 ENCOUNTER — Encounter: Payer: Self-pay | Admitting: Family Medicine

## 2016-02-12 VITALS — BP 138/72 | HR 78 | Temp 98.5°F | Ht 65.0 in | Wt 148.0 lb

## 2016-02-12 DIAGNOSIS — M129 Arthropathy, unspecified: Secondary | ICD-10-CM

## 2016-02-12 DIAGNOSIS — I10 Essential (primary) hypertension: Secondary | ICD-10-CM | POA: Diagnosis not present

## 2016-02-12 DIAGNOSIS — G4700 Insomnia, unspecified: Secondary | ICD-10-CM | POA: Insufficient documentation

## 2016-02-12 DIAGNOSIS — M1611 Unilateral primary osteoarthritis, right hip: Secondary | ICD-10-CM

## 2016-02-12 MED ORDER — GABAPENTIN 300 MG PO CAPS: 300.0000 mg | ORAL_CAPSULE | Freq: Every day | ORAL | 3 refills | Status: DC

## 2016-02-12 NOTE — Progress Notes (Signed)
Pre visit review using our clinic review tool, if applicable. No additional management support is needed unless otherwise documented below in the visit note. 

## 2016-02-12 NOTE — Progress Notes (Signed)
   Subjective:    Patient ID: Karen Watson, female    DOB: [birth year], 75 y.o.   MRN: [id number]  HPI Here to follow up after she had a right hip total arthroplasty on 01-28-16 per Dr. [name]. This went well, and she only has one session of PT remaining for rehab. She sleeps well. Her BP is stable. She has stopped all pain meds because she is concerned about the possibility of addiction to them.    Review of Systems  Constitutional: Negative.   Respiratory: Negative.   Cardiovascular: Negative.   Musculoskeletal: Positive for arthralgias.  Neurological: Negative.        Objective:   Physical Exam  Constitutional: She is oriented to person, place, and time. She appears well-developed and well-nourished.  Cardiovascular: Normal rate, regular rhythm, normal heart sounds and intact distal pulses.   Pulmonary/Chest: Effort normal and breath sounds normal.  Musculoskeletal: Normal range of motion. She exhibits no tenderness.  Neurological: She is alert and oriented to person, place, and time.          Assessment & Plan:  She is doing well after hip surgery. HTN is stable. Insomnia is stable. For pain control, the main time she has difficulty is at night. I suggested she take Gabapentin 300 mg at bedtime only top avoid daytime sedation. She agreed.  Laurey Morale, MD'

## 2016-02-13 DIAGNOSIS — Z85118 Personal history of other malignant neoplasm of bronchus and lung: Secondary | ICD-10-CM | POA: Diagnosis not present

## 2016-02-13 DIAGNOSIS — Z96641 Presence of right artificial hip joint: Secondary | ICD-10-CM | POA: Diagnosis not present

## 2016-02-13 DIAGNOSIS — N182 Chronic kidney disease, stage 2 (mild): Secondary | ICD-10-CM | POA: Diagnosis not present

## 2016-02-13 DIAGNOSIS — Z471 Aftercare following joint replacement surgery: Secondary | ICD-10-CM | POA: Diagnosis not present

## 2016-02-13 DIAGNOSIS — M1991 Primary osteoarthritis, unspecified site: Secondary | ICD-10-CM | POA: Diagnosis not present

## 2016-02-13 DIAGNOSIS — Z79891 Long term (current) use of opiate analgesic: Secondary | ICD-10-CM | POA: Diagnosis not present

## 2016-02-13 DIAGNOSIS — M810 Age-related osteoporosis without current pathological fracture: Secondary | ICD-10-CM | POA: Diagnosis not present

## 2016-02-13 DIAGNOSIS — M4696 Unspecified inflammatory spondylopathy, lumbar region: Secondary | ICD-10-CM | POA: Diagnosis not present

## 2016-02-13 DIAGNOSIS — Z7982 Long term (current) use of aspirin: Secondary | ICD-10-CM | POA: Diagnosis not present

## 2016-02-13 DIAGNOSIS — Z9889 Other specified postprocedural states: Secondary | ICD-10-CM | POA: Diagnosis not present

## 2016-02-13 DIAGNOSIS — I129 Hypertensive chronic kidney disease with stage 1 through stage 4 chronic kidney disease, or unspecified chronic kidney disease: Secondary | ICD-10-CM | POA: Diagnosis not present

## 2016-02-22 ENCOUNTER — Other Ambulatory Visit: Payer: Self-pay | Admitting: Family Medicine

## 2016-03-11 DIAGNOSIS — Z91048 Other nonmedicinal substance allergy status: Secondary | ICD-10-CM | POA: Diagnosis not present

## 2016-03-11 DIAGNOSIS — Z885 Allergy status to narcotic agent status: Secondary | ICD-10-CM | POA: Diagnosis not present

## 2016-03-11 DIAGNOSIS — Z79899 Other long term (current) drug therapy: Secondary | ICD-10-CM | POA: Diagnosis not present

## 2016-03-11 DIAGNOSIS — Z880 Allergy status to penicillin: Secondary | ICD-10-CM | POA: Diagnosis not present

## 2016-03-11 DIAGNOSIS — Z888 Allergy status to other drugs, medicaments and biological substances status: Secondary | ICD-10-CM | POA: Diagnosis not present

## 2016-03-11 DIAGNOSIS — D3A00 Benign carcinoid tumor of unspecified site: Secondary | ICD-10-CM | POA: Diagnosis not present

## 2016-03-11 DIAGNOSIS — M1611 Unilateral primary osteoarthritis, right hip: Secondary | ICD-10-CM | POA: Diagnosis not present

## 2016-03-31 ENCOUNTER — Ambulatory Visit (INDEPENDENT_AMBULATORY_CARE_PROVIDER_SITE_OTHER): Payer: Medicare Other

## 2016-03-31 DIAGNOSIS — Z23 Encounter for immunization: Secondary | ICD-10-CM

## 2016-04-06 DIAGNOSIS — D1801 Hemangioma of skin and subcutaneous tissue: Secondary | ICD-10-CM | POA: Diagnosis not present

## 2016-04-06 DIAGNOSIS — D235 Other benign neoplasm of skin of trunk: Secondary | ICD-10-CM | POA: Diagnosis not present

## 2016-04-06 DIAGNOSIS — L821 Other seborrheic keratosis: Secondary | ICD-10-CM | POA: Diagnosis not present

## 2016-04-06 DIAGNOSIS — L570 Actinic keratosis: Secondary | ICD-10-CM | POA: Diagnosis not present

## 2016-04-06 DIAGNOSIS — L814 Other melanin hyperpigmentation: Secondary | ICD-10-CM | POA: Diagnosis not present

## 2016-04-29 ENCOUNTER — Encounter: Payer: Self-pay | Admitting: Family Medicine

## 2016-04-29 ENCOUNTER — Ambulatory Visit (INDEPENDENT_AMBULATORY_CARE_PROVIDER_SITE_OTHER): Payer: Medicare Other | Admitting: Family Medicine

## 2016-04-29 VITALS — BP 131/76 | HR 71 | Temp 98.1°F | Ht 65.0 in | Wt 153.0 lb

## 2016-04-29 DIAGNOSIS — I872 Venous insufficiency (chronic) (peripheral): Secondary | ICD-10-CM

## 2016-04-29 NOTE — Progress Notes (Signed)
   Subjective:    Patient ID: Karen Watson, female    DOB: 14-Oct-1940, 75 y.o.   MRN: [id number]  HPI Here to ask about swelling in the right foot that started about a year ago. She has a small amount of swelling in the left foot as well but its worst on the right. There is no pain but sometimes it feels tight. It usually goes down overnight. No chest pain or SOB. She tries to elevated the leg when she can.    Review of Systems  Constitutional: Negative.   Respiratory: Negative.   Cardiovascular: Positive for leg swelling. Negative for chest pain and palpitations.  Skin: Negative.   Neurological: Negative.        Objective:   Physical Exam  Constitutional: She is oriented to person, place, and time. She appears well-developed and well-nourished. No distress.  Cardiovascular: Normal rate, regular rhythm, normal heart sounds and intact distal pulses.   Pulmonary/Chest: Effort normal and breath sounds normal.  Musculoskeletal:  The right foot and ankle show 2+ edema and the left foot and ankle show 1+ edema. There is no tenderness, no erythema, no warmth. The calf is normal.   Neurological: She is alert and oriented to person, place, and time.          Assessment & Plan:  Venous insufficiency. I explained that this is a mechanical problem and not a matter of fluid overload. Advised her to wear a knee high support stocking on the leg when she is up on her feet.  Laurey Morale, MD

## 2016-04-29 NOTE — Progress Notes (Signed)
Pre visit review using our clinic review tool, if applicable. No additional management support is needed unless otherwise documented below in the visit note. 

## 2016-05-02 ENCOUNTER — Encounter (HOSPITAL_COMMUNITY): Payer: Self-pay | Admitting: Orthopedic Surgery

## 2016-05-14 ENCOUNTER — Encounter (HOSPITAL_COMMUNITY): Payer: Self-pay | Admitting: Orthopedic Surgery

## 2016-05-14 NOTE — OR Nursing (Signed)
Late entry for correction of data entry error for patient entrance time to OR.

## 2016-05-22 ENCOUNTER — Other Ambulatory Visit: Payer: Self-pay | Admitting: Family Medicine

## 2016-05-26 DIAGNOSIS — M5412 Radiculopathy, cervical region: Secondary | ICD-10-CM | POA: Diagnosis not present

## 2016-05-26 DIAGNOSIS — M7541 Impingement syndrome of right shoulder: Secondary | ICD-10-CM | POA: Diagnosis not present

## 2016-05-26 DIAGNOSIS — M19011 Primary osteoarthritis, right shoulder: Secondary | ICD-10-CM | POA: Diagnosis not present

## 2016-05-26 DIAGNOSIS — Z91041 Radiographic dye allergy status: Secondary | ICD-10-CM | POA: Diagnosis not present

## 2016-05-26 DIAGNOSIS — M4802 Spinal stenosis, cervical region: Secondary | ICD-10-CM | POA: Diagnosis not present

## 2016-05-26 NOTE — Telephone Encounter (Signed)
Can we refill these? 

## 2016-06-01 DIAGNOSIS — D3A00 Benign carcinoid tumor of unspecified site: Secondary | ICD-10-CM | POA: Diagnosis not present

## 2016-06-10 DIAGNOSIS — M1611 Unilateral primary osteoarthritis, right hip: Secondary | ICD-10-CM | POA: Diagnosis not present

## 2016-06-10 DIAGNOSIS — M5412 Radiculopathy, cervical region: Secondary | ICD-10-CM | POA: Diagnosis not present

## 2016-06-10 DIAGNOSIS — M25551 Pain in right hip: Secondary | ICD-10-CM | POA: Diagnosis not present

## 2016-06-10 DIAGNOSIS — M4802 Spinal stenosis, cervical region: Secondary | ICD-10-CM | POA: Diagnosis not present

## 2016-06-17 ENCOUNTER — Encounter: Payer: Self-pay | Admitting: Adult Health

## 2016-06-17 ENCOUNTER — Ambulatory Visit (INDEPENDENT_AMBULATORY_CARE_PROVIDER_SITE_OTHER): Payer: Medicare Other | Admitting: Adult Health

## 2016-06-17 VITALS — BP 126/74 | Temp 98.3°F | Ht 65.0 in | Wt 150.4 lb

## 2016-06-17 DIAGNOSIS — J0101 Acute recurrent maxillary sinusitis: Secondary | ICD-10-CM

## 2016-06-17 MED ORDER — BENZONATATE 200 MG PO CAPS: 200.0000 mg | ORAL_CAPSULE | Freq: Two times a day (BID) | ORAL | 0 refills | Status: DC | PRN

## 2016-06-17 MED ORDER — AZITHROMYCIN 250 MG PO TABS: ORAL_TABLET | ORAL | 0 refills | Status: DC

## 2016-06-17 NOTE — Progress Notes (Signed)
Subjective:    Patient ID: Karen Watson, female    DOB: Dec 26, 1940, 75 y.o.   MRN: [id number]  URI   This is a new problem. The current episode started in the past 7 days (5 days ). The problem has been unchanged. There has been no fever. Associated symptoms include congestion, coughing (productive cough ), rhinorrhea and sinus pain. She has tried decongestant for the symptoms.      Review of Systems  Constitutional: Positive for chills and fatigue. Negative for activity change and fever.  HENT: Positive for congestion, postnasal drip, rhinorrhea, sinus pain and sinus pressure.   Respiratory: Positive for cough (productive cough ), chest tightness and shortness of breath.   Cardiovascular: Negative.    Past Medical History:  Diagnosis Date  . Arthritis    "thumbs, neck, shoulders, lower back" (06/08/2012)  . Basal cell carcinoma 1995-present   "face and back" (06/08/2012)  . Chronic bronchitis ([place])    last winter  . Chronic kidney disease    states she is stage 3   . Complication of anesthesia 01/2003   developed Hives, vomiting, SOB, chest heaviness after gallbladder surgery was told they believe it was due to either ancef or carbocaine  . Constipation   . Diarrhea   . DJD (degenerative joint disease)    see Dr. Charlestine Night  . Family history of anesthesia complication    daughter had surgery that didn't require vent but she ended up on one but pt has no idea why.  Lestine Mount)    Dr. Erling Cruz; "frequent; w/weather changes" (06/08/2012)  . History of shingles   . Hypertension    takes Amlodipine and Maxzide daily  . Insomnia    not on any meds  . Joint pain   . Joint swelling   . Lung cancer, lower lobe ([place]) 08/1997   "lower right lobe" (06/08/2012)  . Lung cancer, middle lobe ([place]) 06/1997   "right; inactive since 1999" (06/08/2012)  . Lung nodules    multpile  . Memory loss   . Nocturia   . Pneumonia    hx of about 5 yrs ago  . PONV (postoperative nausea and  vomiting)   . Restless leg    takes tonic water at bedtime  . Restless leg   . Vertigo    doesn't take any meds  . Weakness    numbness and tingling right leg    Social History   Social History  . Marital status: Married    Spouse name: N/A  . Number of children: N/A  . Years of education: N/A   Occupational History  . Not on file.   Social History Main Topics  . Smoking status: Never Smoker  . Smokeless tobacco: Never Used  . Alcohol use No  . Drug use: No  . Sexual activity: Yes   Other Topics Concern  . Not on file   Social History Narrative   Married          Past Surgical History:  Procedure Laterality Date  . ANTERIOR LAT LUMBAR FUSION  06/08/2012   L2-3; L3-4  . ANTERIOR LAT LUMBAR FUSION  06/08/2012   Procedure: ANTERIOR LATERAL LUMBAR FUSION 2 LEVELS;  Surgeon: Eustace Moore, MD;  Location: [facility];  Service: Neurosurgery;  Laterality: Right;  Right Lumbar two-three, lumbar three-four extreme lateral interbody fusion with percutaneous pedicle screws  . BASAL CELL CARCINOMA EXCISION  1995-2011   "off back; @ least a couple/yr; Dr. Tonia Brooms" (  06/08/2012)  . BREAST BIOPSY  07/2013   left breast  . CATARACT EXTRACTION W/ INTRAOCULAR LENS  IMPLANT, BILATERAL  2011   per Dr. Ayesha Rumpf in [place]  . CHOLECYSTECTOMY  01/2003  . colonscopy  08-03-13   per Dr. Hilarie Fredrickson, clear, no repeats needed   . dexa  01/2009   normal  . DILATION AND CURETTAGE OF UTERUS  2000's  . ESOPHAGOGASTRODUODENOSCOPY    . LUMBAR LAMINECTOMY/DECOMPRESSION MICRODISCECTOMY Right 10/09/2015   Procedure: Right Lumbar Five-Sacral One Extraforaminal Foraminotomy with removal of synovial cyst;  Surgeon: Eustace Moore, MD;  Location: [facility];  Service: Neurosurgery;  Laterality: Right;  . LUMBAR PERCUTANEOUS PEDICLE SCREW 1 LEVEL  06/08/2012   Procedure: LUMBAR PERCUTANEOUS PEDICLE SCREW 1 LEVEL;  Surgeon: Eustace Moore, MD;  Location: [facility];  Service: Neurosurgery;  Laterality:  Right;  Right Lumbar two-three, lumbar three-four extreme lateral interbody fusion with percutaneous pedicle screws  . LUNG LOBECTOMY  08/1997   RLL "for carcinoid" (06/08/2012)  . TONSILLECTOMY  1947  . TOTAL HIP ARTHROPLASTY Right 01/28/2016   Procedure: RIGHT TOTAL HIP ARTHROPLASTY ANTERIOR APPROACH;  Surgeon: Renette Butters, MD;  Location: [facility];  Service: Orthopedics;  Laterality: Right;  . TUBAL LIGATION  ~ 1976    Family History  Problem Relation Age of Onset  . Multiple sclerosis Brother   . Asthma    . Coronary artery disease    . Diabetes    . Hypertension    . Melanoma    . Osteoporosis    . Uterine cancer    . Kidney cancer    . Colon cancer Neg Hx     Allergies  Allergen Reactions  . Carbocaine [Mepivacaine Hcl] Hives and Nausea And Vomiting  . Codeine Anaphylaxis, Shortness Of Breath, Nausea And Vomiting and Other (See Comments)    Respiratory distress; Low blood pressure. Pt tolerates percocet.  . Hydrocodone-Homatropine Anaphylaxis, Nausea And Vomiting and Shortness Of Breath    Dangerously low blood pressure, respiratory distress; heaviness in chest  . Metrizamide Hives    Pre-medicated with Benadryl '50mg'$  PO  . Propoxyphene N-Acetaminophen Anaphylaxis and Nausea And Vomiting    "Darvocet" t; Lightheadedness, confusion  . Robaxin [Methocarbamol] Other (See Comments)    "severe Altzheimer's symptoms"  . Tape Rash and Other (See Comments)    Surgical tape causes rash and big blisters  . Tramadol Other (See Comments)    Low respiratory rate, nausea, chest heaviness  . Vicodin [Hydrocodone-Acetaminophen] Anaphylaxis  . Cefazolin Hives, Nausea And Vomiting and Other (See Comments)    "Ancef"; Also, severe stomach pain  . Iodinated Diagnostic Agents Hives    Pre-medicated with Benadryl '50mg'$  PO  . Penicillins Hives, Nausea And Vomiting and Other (See Comments)    severe stomach pain, Has patient had a PCN reaction causing immediate rash, facial/tongue/throat  swelling, SOB or lightheadedness with hypotension: Yes Has patient had a PCN reaction causing severe rash involving mucus membranes or skin necrosis: No Has patient had a PCN reaction that required hospitalization No Has patient had a PCN reaction occurring within the last 10 years: Yes If all of the above answers are "NO", then may proceed with Cephalosporin use.   . Nsaids Other (See Comments)    Bruising; use with caution    Current Outpatient Prescriptions on File Prior to Visit  Medication Sig Dispense Refill  . acetaminophen (TYLENOL) 500 MG tablet Take 1,000 mg by mouth 3 (three) times daily as needed for mild  pain.     . amLODipine (NORVASC) 5 MG tablet TAKE 1 TABLET BY MOUTH  DAILY 90 tablet 3  . b complex vitamins tablet Take 1 tablet by mouth daily.      . Calcium Carb-Cholecalciferol (CALCIUM 600 + D PO) Take 600 mg by mouth 2 (two) times daily.    . fish oil-omega-3 fatty acids 1000 MG capsule Take 1 g by mouth daily.     Marland Kitchen gabapentin (NEURONTIN) 300 MG capsule Take 1 capsule (300 mg total) by mouth at bedtime. 270 capsule 3  . MAGNESIUM-ZINC PO Take 1 tablet by mouth every other day.     . MULTIPLE VITAMIN PO Take 1 tablet by mouth daily.     . potassium chloride SA (K-DUR,KLOR-CON) 20 MEQ tablet TAKE 1 TABLET BY MOUTH  DAILY 90 tablet 3  . triamterene-hydrochlorothiazide (MAXZIDE-25) 37.5-25 MG tablet Take 1 tablet by mouth  daily 90 tablet 3  . vitamin C (ASCORBIC ACID) 500 MG tablet Take 500 mg by mouth daily as needed (in winter months).     . vitamin E 400 UNIT capsule Take 400 Units by mouth daily.       No current facility-administered medications on file prior to visit.     BP 126/74   Temp 98.3 F (36.8 C) (Oral)   Ht '5\' 5"'$  (1.651 m)   Wt 150 lb 6.4 oz (68.2 kg)   BMI 25.03 kg/m       Objective:   Physical Exam  Constitutional: She is oriented to person, place, and time. She appears well-developed and well-nourished. No distress.  HENT:  Head:  Normocephalic and atraumatic.  Right Ear: Hearing, tympanic membrane, external ear and ear canal normal.  Left Ear: Hearing, tympanic membrane, external ear and ear canal normal.  Nose: Mucosal edema and rhinorrhea present. Right sinus exhibits maxillary sinus tenderness. Left sinus exhibits maxillary sinus tenderness.  Mouth/Throat: Uvula is midline and oropharynx is clear and moist. No oropharyngeal exudate or posterior oropharyngeal edema.  Eyes: Conjunctivae and EOM are normal. Pupils are equal, round, and reactive to light. Right eye exhibits no discharge. Left eye exhibits no discharge. No scleral icterus.  Neck: Normal range of motion. Neck supple. No thyromegaly present.  Cardiovascular: Normal rate, regular rhythm, normal heart sounds and intact distal pulses.  Exam reveals no gallop and no friction rub.   No murmur heard. Pulmonary/Chest: Effort normal and breath sounds normal. No respiratory distress. She has no wheezes. She has no rales. She exhibits no tenderness.  Lymphadenopathy:    She has no cervical adenopathy.  Neurological: She is alert and oriented to person, place, and time.  Skin: Skin is warm and dry. No rash noted. She is not diaphoretic. No erythema. No pallor.  Psychiatric: She has a normal mood and affect. Her behavior is normal. Thought content normal.  Nursing note and vitals reviewed.     Assessment & Plan:  1. Acute recurrent maxillary sinusitis - azithromycin (ZITHROMAX Z-PAK) 250 MG tablet; Take 2 tablets on Day 1.  Then take 1 tablet daily.  Dispense: 6 tablet; Refill: 0 - benzonatate (TESSALON) 200 MG capsule; Take 1 capsule (200 mg total) by mouth 2 (two) times daily as needed for cough.  Dispense: 20 capsule; Refill: 0 - Follow up if no improvement   Dorothyann Peng, NP

## 2016-06-23 DIAGNOSIS — I10 Essential (primary) hypertension: Secondary | ICD-10-CM | POA: Diagnosis not present

## 2016-06-23 DIAGNOSIS — M4802 Spinal stenosis, cervical region: Secondary | ICD-10-CM | POA: Diagnosis not present

## 2016-06-23 DIAGNOSIS — M47812 Spondylosis without myelopathy or radiculopathy, cervical region: Secondary | ICD-10-CM | POA: Diagnosis not present

## 2016-07-01 DIAGNOSIS — M47812 Spondylosis without myelopathy or radiculopathy, cervical region: Secondary | ICD-10-CM | POA: Diagnosis not present

## 2016-07-10 ENCOUNTER — Other Ambulatory Visit: Payer: Self-pay | Admitting: Family Medicine

## 2016-07-17 ENCOUNTER — Encounter: Payer: Medicare Other | Admitting: Family Medicine

## 2016-08-31 ENCOUNTER — Other Ambulatory Visit: Payer: Self-pay | Admitting: Family Medicine

## 2016-09-17 ENCOUNTER — Ambulatory Visit (INDEPENDENT_AMBULATORY_CARE_PROVIDER_SITE_OTHER): Payer: Medicare Other | Admitting: Family Medicine

## 2016-09-17 ENCOUNTER — Encounter: Payer: Self-pay | Admitting: Family Medicine

## 2016-09-17 VITALS — BP 138/69 | HR 76 | Temp 98.1°F | Ht 65.0 in | Wt 151.0 lb

## 2016-09-17 DIAGNOSIS — S39012A Strain of muscle, fascia and tendon of lower back, initial encounter: Secondary | ICD-10-CM | POA: Diagnosis not present

## 2016-09-17 NOTE — Patient Instructions (Signed)
WE NOW OFFER   [facility] FAST TRACK!!!  SAME DAY Appointments for ACUTE CARE  Such as: Sprains, Injuries, cuts, abrasions, rashes, muscle pain, joint pain, back pain Colds, flu, sore throats, headache, allergies, cough, fever  Ear pain, sinus and eye infections Abdominal pain, nausea, vomiting, diarrhea, upset stomach Animal/insect bites  3 Easy Ways to Schedule: Walk-In Scheduling Call in scheduling Mychart Sign-up: [email or web address]         

## 2016-09-17 NOTE — Progress Notes (Signed)
Pre visit review using our clinic review tool, if applicable. No additional management support is needed unless otherwise documented below in the visit note. 

## 2016-09-17 NOTE — Progress Notes (Signed)
   Subjective:    Patient ID: Karen Watson, female    DOB: 11-13-1940, 76 y.o.   MRN: [id number]  HPI Here for 3 days of a sharp pain in the right lower back that extends around the right flank. This started after she worked in her yard raking leaves out from under the bushes. She has applied ice and taken Tylenol. She actually feels better today than yesterday.    Review of Systems  Constitutional: Negative.   Respiratory: Negative.   Cardiovascular: Negative.   Gastrointestinal: Negative.   Genitourinary: Negative.   Musculoskeletal: Positive for back pain.       Objective:   Physical Exam  Constitutional: She is oriented to person, place, and time. She appears well-developed and well-nourished. No distress.  Cardiovascular: Normal rate, regular rhythm, normal heart sounds and intact distal pulses.   Pulmonary/Chest: Effort normal and breath sounds normal.  Musculoskeletal:  Tender in the right lower back and around the right flank. No spinal tenderness. Full ROM   Neurological: She is alert and oriented to person, place, and time.          Assessment & Plan:  Low back strain. Rest, use Tylenol prn. Switch to moist heat. Recheck prn.  Alysia Penna, MD

## 2016-10-02 ENCOUNTER — Other Ambulatory Visit: Payer: Self-pay | Admitting: Family Medicine

## 2016-10-22 ENCOUNTER — Ambulatory Visit (INDEPENDENT_AMBULATORY_CARE_PROVIDER_SITE_OTHER): Payer: Medicare Other | Admitting: Family Medicine

## 2016-10-22 ENCOUNTER — Encounter: Payer: Self-pay | Admitting: Family Medicine

## 2016-10-22 VITALS — BP 128/70 | HR 71 | Temp 97.7°F | Ht 66.0 in | Wt 152.8 lb

## 2016-10-22 DIAGNOSIS — M1611 Unilateral primary osteoarthritis, right hip: Secondary | ICD-10-CM

## 2016-10-22 DIAGNOSIS — I10 Essential (primary) hypertension: Secondary | ICD-10-CM | POA: Diagnosis not present

## 2016-10-22 DIAGNOSIS — I872 Venous insufficiency (chronic) (peripheral): Secondary | ICD-10-CM | POA: Diagnosis not present

## 2016-10-22 DIAGNOSIS — G8929 Other chronic pain: Secondary | ICD-10-CM

## 2016-10-22 DIAGNOSIS — F411 Generalized anxiety disorder: Secondary | ICD-10-CM | POA: Diagnosis not present

## 2016-10-22 DIAGNOSIS — M5440 Lumbago with sciatica, unspecified side: Secondary | ICD-10-CM

## 2016-10-22 DIAGNOSIS — D3A00 Benign carcinoid tumor of unspecified site: Secondary | ICD-10-CM | POA: Diagnosis not present

## 2016-10-22 LAB — POC URINALSYSI DIPSTICK (AUTOMATED)
Bilirubin, UA: NEGATIVE
GLUCOSE UA: NEGATIVE
Ketones, UA: NEGATIVE
Leukocytes, UA: NEGATIVE
Nitrite, UA: NEGATIVE
Protein, UA: NEGATIVE
RBC UA: NEGATIVE
SPEC GRAV UA: 1.02 (ref 1.010–1.025)
UROBILINOGEN UA: 0.2 U/dL
pH, UA: 6 (ref 5.0–8.0)

## 2016-10-22 LAB — HEPATIC FUNCTION PANEL
ALBUMIN: 4.4 g/dL (ref 3.5–5.2)
ALK PHOS: 82 U/L (ref 39–117)
ALT: 15 U/L (ref 0–35)
AST: 17 U/L (ref 0–37)
Bilirubin, Direct: 0.1 mg/dL (ref 0.0–0.3)
TOTAL PROTEIN: 6.7 g/dL (ref 6.0–8.3)
Total Bilirubin: 0.4 mg/dL (ref 0.2–1.2)

## 2016-10-22 LAB — BASIC METABOLIC PANEL
BUN: 15 mg/dL (ref 6–23)
CALCIUM: 9.9 mg/dL (ref 8.4–10.5)
CO2: 34 meq/L — AB (ref 19–32)
CREATININE: 0.89 mg/dL (ref 0.40–1.20)
Chloride: 102 mEq/L (ref 96–112)
GFR: 65.76 mL/min (ref >60.00)
GLUCOSE: 101 mg/dL — AB (ref 70–99)
Potassium: 3.7 mEq/L (ref 3.5–5.1)
Sodium: 143 mEq/L (ref 135–145)

## 2016-10-22 LAB — TSH: TSH: 1.41 u[IU]/mL (ref 0.35–4.50)

## 2016-10-22 LAB — CBC WITH DIFFERENTIAL/PLATELET
BASOS PCT: 0.8 % (ref 0.0–3.0)
Basophils Absolute: 0 10*3/uL (ref 0.0–0.1)
EOS ABS: 0.1 10*3/uL (ref 0.0–0.7)
Eosinophils Relative: 2 % (ref 0.0–5.0)
HCT: 41.6 % (ref 36.0–46.0)
HEMOGLOBIN: 13.7 g/dL (ref 12.0–15.0)
LYMPHS ABS: 1.6 10*3/uL (ref 0.7–4.0)
Lymphocytes Relative: 34.4 % (ref 12.0–46.0)
MCHC: 33 g/dL (ref 30.0–36.0)
MCV: 92.3 fl (ref 78.0–100.0)
MONO ABS: 0.4 10*3/uL (ref 0.1–1.0)
Monocytes Relative: 7.9 % (ref 3.0–12.0)
NEUTROS ABS: 2.6 10*3/uL (ref 1.4–7.7)
NEUTROS PCT: 54.9 % (ref 43.0–77.0)
PLATELETS: 230 10*3/uL (ref 150.0–400.0)
RBC: 4.51 Mil/uL (ref 3.87–5.11)
RDW: 14.9 % (ref 11.5–15.5)
WBC: 4.8 10*3/uL (ref 4.0–10.5)

## 2016-10-22 LAB — LIPID PANEL
CHOL/HDL RATIO: 3
Cholesterol: 181 mg/dL (ref 0–200)
HDL: 55.7 mg/dL (ref >39.00)
LDL CALC: 107 mg/dL — AB (ref 0–99)
NonHDL: 125.59
TRIGLYCERIDES: 95 mg/dL (ref 0.0–149.0)
VLDL: 19 mg/dL (ref 0.0–40.0)

## 2016-10-22 NOTE — Progress Notes (Signed)
   Subjective:    Patient ID: Karen Watson, female    DOB: Jul 14, 1940, 76 y.o.   MRN: [id number]  HPI Here to follow up on issues. She feels fine. Her BP has been stable at home in the range of 110-120 over 70s. She asks if she can stop taking Amlodidpine at this point. She is due for another mammogram. Her back is doing well. She admits to feeling very stressed by worry about her husband. He has early dementia and his memory is fading.    Review of Systems  Constitutional: Negative.   HENT: Negative.   Eyes: Negative.   Respiratory: Negative.   Cardiovascular: Negative.   Gastrointestinal: Negative.   Genitourinary: Negative for decreased urine volume, difficulty urinating, dyspareunia, dysuria, enuresis, flank pain, frequency, hematuria, pelvic pain and urgency.  Musculoskeletal: Negative.   Skin: Negative.   Neurological: Negative.   Psychiatric/Behavioral: Negative for agitation, behavioral problems, confusion, decreased concentration, dysphoric mood, hallucinations, self-injury, sleep disturbance and suicidal ideas. The patient is nervous/anxious. The patient is not hyperactive.        Objective:   Physical Exam  Constitutional: She is oriented to person, place, and time. She appears well-developed and well-nourished. No distress.  HENT:  Head: Normocephalic and atraumatic.  Right Ear: External ear normal.  Left Ear: External ear normal.  Nose: Nose normal.  Mouth/Throat: Oropharynx is clear and moist. No oropharyngeal exudate.  Eyes: Conjunctivae and EOM are normal. Pupils are equal, round, and reactive to light. No scleral icterus.  Neck: Normal range of motion. Neck supple. No JVD present. No thyromegaly present.  Cardiovascular: Normal rate, regular rhythm, normal heart sounds and intact distal pulses.  Exam reveals no gallop and no friction rub.   No murmur heard. Pulmonary/Chest: Effort normal and breath sounds normal. No respiratory distress. She has no wheezes. She  has no rales. She exhibits no tenderness.  Abdominal: Soft. Bowel sounds are normal. She exhibits no distension and no mass. There is no tenderness. There is no rebound and no guarding.  Musculoskeletal: Normal range of motion. She exhibits no edema or tenderness.  Lymphadenopathy:    She has no cervical adenopathy.  Neurological: She is alert and oriented to person, place, and time. She has normal reflexes. No cranial nerve deficit. She exhibits normal muscle tone. Coordination normal.  Skin: Skin is warm and dry. No rash noted. No erythema.  Psychiatric: She has a normal mood and affect. Her behavior is normal. Judgment and thought content normal.          Assessment & Plan:  Her HTN is stable so we agreed to stop the Amlodipine. She will check her BP daily and give [place] a report in 3 weeks. Get fasting labs today. She will continue to wear compression stockings for the legs.  Alysia Penna, MD

## 2016-11-04 ENCOUNTER — Other Ambulatory Visit: Payer: Self-pay | Admitting: Family Medicine

## 2016-11-04 NOTE — Telephone Encounter (Signed)
This should be taking TID

## 2016-11-04 NOTE — Telephone Encounter (Signed)
There are 2 different directions for this medication, can you clarify?

## 2016-11-11 ENCOUNTER — Ambulatory Visit (INDEPENDENT_AMBULATORY_CARE_PROVIDER_SITE_OTHER): Payer: Medicare Other | Admitting: Family Medicine

## 2016-11-11 ENCOUNTER — Encounter: Payer: Self-pay | Admitting: Family Medicine

## 2016-11-11 VITALS — BP 138/78 | HR 74 | Temp 98.1°F | Ht 66.0 in | Wt 154.0 lb

## 2016-11-11 DIAGNOSIS — G4489 Other headache syndrome: Secondary | ICD-10-CM

## 2016-11-11 DIAGNOSIS — I10 Essential (primary) hypertension: Secondary | ICD-10-CM | POA: Diagnosis not present

## 2016-11-11 MED ORDER — METHYLPREDNISOLONE 4 MG PO TBPK: ORAL_TABLET | ORAL | 0 refills | Status: DC

## 2016-11-11 MED ORDER — AMLODIPINE BESYLATE 5 MG PO TABS: 5.0000 mg | ORAL_TABLET | Freq: Every day | ORAL | 3 refills | Status: DC

## 2016-11-11 NOTE — Progress Notes (Signed)
   Subjective:    Patient ID: Karen Watson, female    DOB: [birth year], 76 y.o.   MRN: [id number]  HPI Here for 8 days of a constant mild pressure like headache which is centered in the top of the head and both temples. No vision changes or nausea. She had tried an experiment of stopping Amlodipine for a few weeks but her BP started creeping up into the 140s over 90s. She then went back on this 2 days ago.    Review of Systems  Constitutional: Negative.   HENT: Negative.   Eyes: Negative.   Respiratory: Negative.   Cardiovascular: Negative.   Neurological: Positive for headaches. Negative for dizziness, tremors, seizures, syncope, facial asymmetry, speech difficulty, weakness, light-headedness and numbness.       Objective:   Physical Exam  Constitutional: She is oriented to person, place, and time. She appears well-developed and well-nourished. No distress.  HENT:  Right Ear: External ear normal.  Left Ear: External ear normal.  Nose: Nose normal.  Mouth/Throat: Oropharynx is clear and moist.  Eyes: Conjunctivae and EOM are normal. Pupils are equal, round, and reactive to light.  No photophobia   Neck: Neck supple. No thyromegaly present.  Cardiovascular: Normal rate, regular rhythm, normal heart sounds and intact distal pulses.   Pulmonary/Chest: Effort normal and breath sounds normal.  Lymphadenopathy:    She has no cervical adenopathy.  Neurological: She is alert and oriented to person, place, and time. No cranial nerve deficit.          Assessment & Plan:  This headache is likely related to sinus pressure. She will take Mucinex bid and also a Medrol dose pack. Recheck prn.  Alysia Penna, MD

## 2016-11-11 NOTE — Patient Instructions (Signed)
WE NOW OFFER   [facility] FAST TRACK!!!  SAME DAY Appointments for ACUTE CARE  Such as: Sprains, Injuries, cuts, abrasions, rashes, muscle pain, joint pain, back pain Colds, flu, sore throats, headache, allergies, cough, fever  Ear pain, sinus and eye infections Abdominal pain, nausea, vomiting, diarrhea, upset stomach Animal/insect bites  3 Easy Ways to Schedule: Walk-In Scheduling Call in scheduling Mychart Sign-up: [email or web address]         

## 2016-12-05 IMAGING — RF DG C-ARM 61-120 MIN
1 series · 2 of 2 positions shown · non-contrast
Comparison: None.

CLINICAL DATA: Status post right hip arthroplasty.

EXAM:
OPERATIVE right HIP (WITH PELVIS IF PERFORMED) 2 VIEWS
TECHNIQUE: Fluoroscopic spot image(s) were submitted for interpretation
post-operatively.
FLUOROSCOPY TIME:  15 seconds.

[Series 1: run · 2 of 2 slices shown]
[im 1/2]
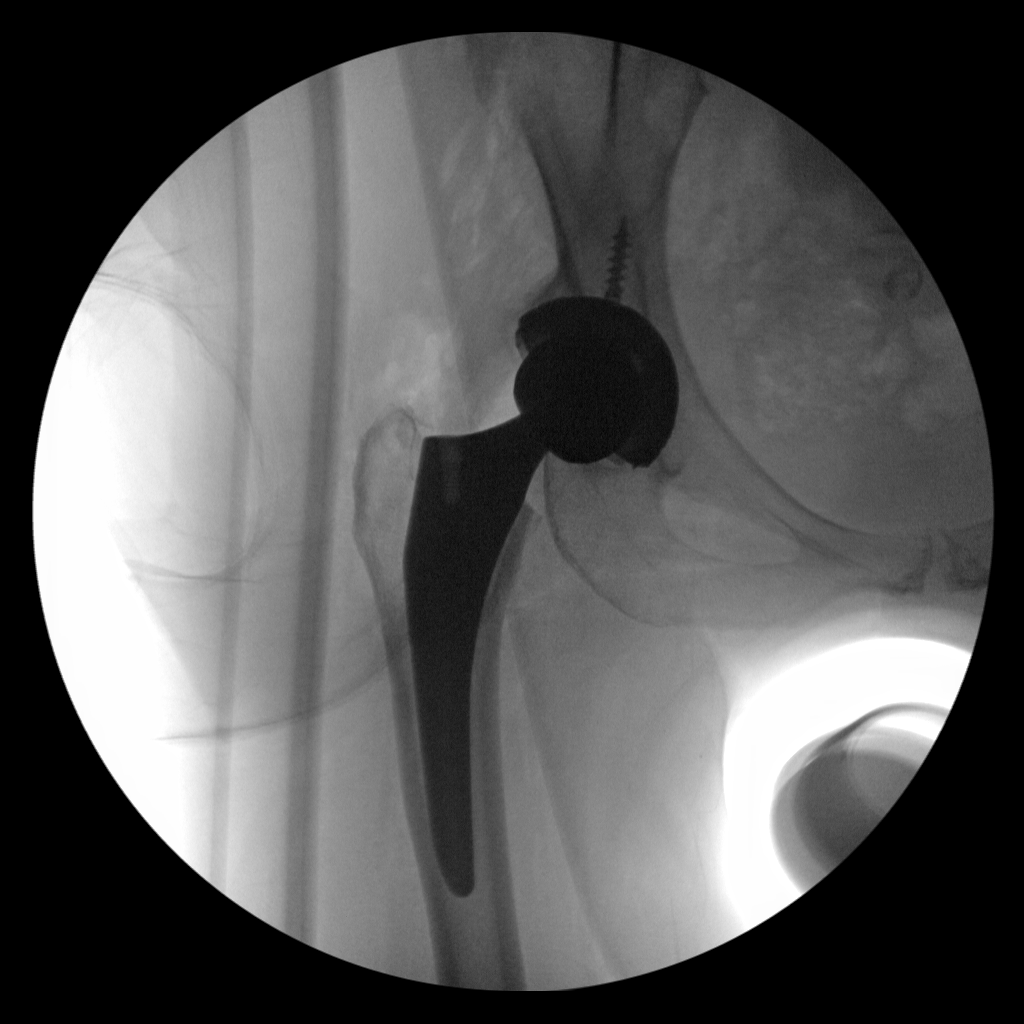
[im 2/2]
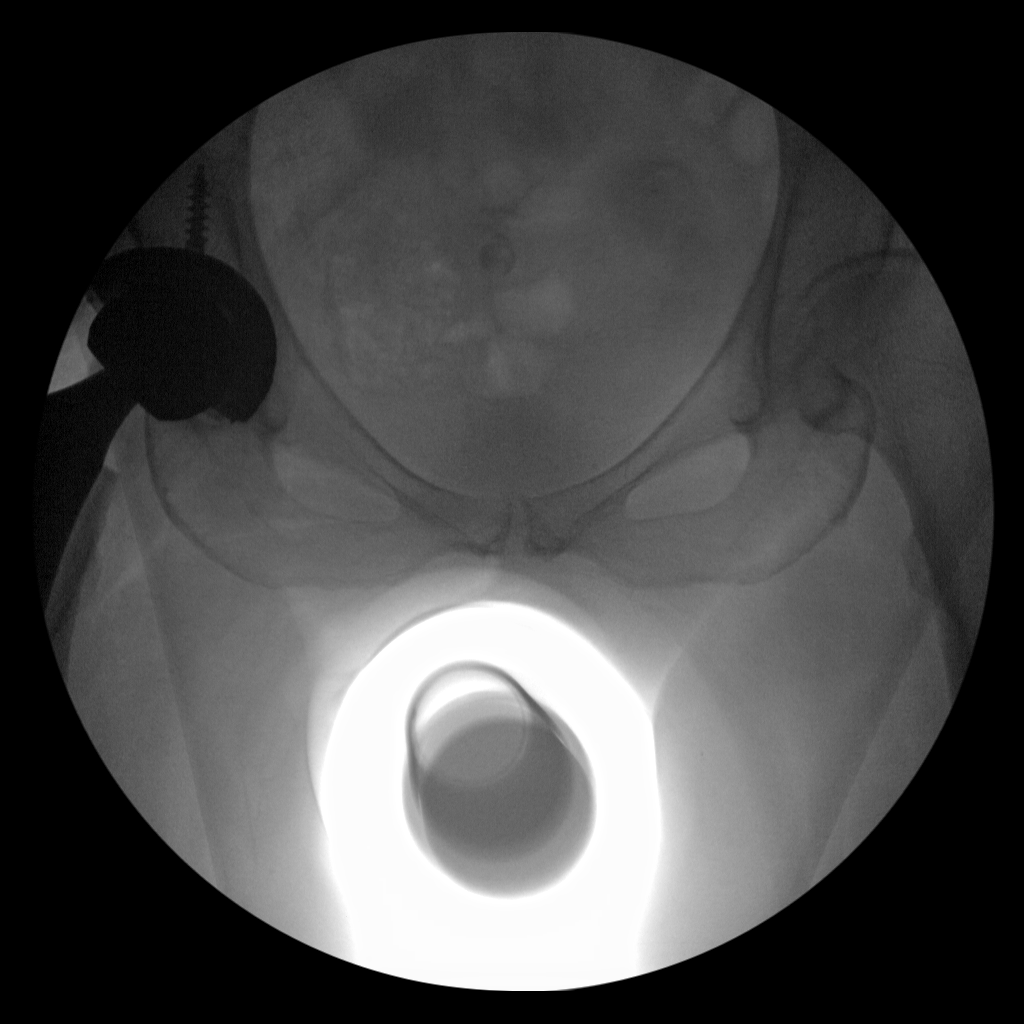

[2 of 2 positions shown; findings below may reference images not displayed]

FINDINGS: Status post right total hip arthroplasty. The femoral and acetabular
components appear to be well situated. No fracture dislocation is
noted expected postoperative changes are seen involving the
surrounding soft tissues.
IMPRESSION: Status post right total hip arthroplasty.

## 2016-12-14 ENCOUNTER — Other Ambulatory Visit: Payer: Self-pay | Admitting: Family Medicine

## 2016-12-31 ENCOUNTER — Telehealth: Payer: Self-pay | Admitting: Family Medicine

## 2016-12-31 NOTE — Telephone Encounter (Signed)
Dental Clinic on 7/20 and need to have a antibiotic due to her having a hip replacement in the past (the clinic requires all pts to have this done if they have any hardware replacements) it is needed for the appointment before and after the visit.  Pharm:  [facility] [place].  Pt state that Dr. Sarajane Jews would know which antibiotic to send in.

## 2017-01-01 MED ORDER — DOXYCYCLINE HYCLATE 100 MG PO TABS: 100.0000 mg | ORAL_TABLET | Freq: Two times a day (BID) | ORAL | 2 refills | Status: DC

## 2017-01-01 NOTE — Telephone Encounter (Signed)
Call in Doxycycline 100 mg bid #14 with 2 rf

## 2017-01-01 NOTE — Telephone Encounter (Signed)
[facility] to [facility] and spoke with pt.  

## 2017-02-27 ENCOUNTER — Other Ambulatory Visit: Payer: Self-pay | Admitting: Family Medicine

## 2017-03-04 ENCOUNTER — Other Ambulatory Visit: Payer: Self-pay | Admitting: Family Medicine

## 2017-04-08 ENCOUNTER — Ambulatory Visit (INDEPENDENT_AMBULATORY_CARE_PROVIDER_SITE_OTHER): Payer: Medicare Other | Admitting: *Deleted

## 2017-04-08 DIAGNOSIS — Z23 Encounter for immunization: Secondary | ICD-10-CM | POA: Diagnosis not present

## 2017-04-26 DIAGNOSIS — D1801 Hemangioma of skin and subcutaneous tissue: Secondary | ICD-10-CM | POA: Diagnosis not present

## 2017-04-26 DIAGNOSIS — L578 Other skin changes due to chronic exposure to nonionizing radiation: Secondary | ICD-10-CM | POA: Diagnosis not present

## 2017-04-26 DIAGNOSIS — D229 Melanocytic nevi, unspecified: Secondary | ICD-10-CM | POA: Diagnosis not present

## 2017-04-26 DIAGNOSIS — L570 Actinic keratosis: Secondary | ICD-10-CM | POA: Diagnosis not present

## 2017-04-26 DIAGNOSIS — L821 Other seborrheic keratosis: Secondary | ICD-10-CM | POA: Diagnosis not present

## 2017-05-25 DIAGNOSIS — D3A00 Benign carcinoid tumor of unspecified site: Secondary | ICD-10-CM | POA: Diagnosis not present

## 2017-05-28 DIAGNOSIS — H527 Unspecified disorder of refraction: Secondary | ICD-10-CM | POA: Diagnosis not present

## 2017-06-07 DIAGNOSIS — M1611 Unilateral primary osteoarthritis, right hip: Secondary | ICD-10-CM | POA: Diagnosis not present

## 2017-06-21 DIAGNOSIS — D3A00 Benign carcinoid tumor of unspecified site: Secondary | ICD-10-CM | POA: Diagnosis not present

## 2017-06-28 DIAGNOSIS — R911 Solitary pulmonary nodule: Secondary | ICD-10-CM | POA: Diagnosis not present

## 2017-07-08 DIAGNOSIS — D3A00 Benign carcinoid tumor of unspecified site: Secondary | ICD-10-CM | POA: Diagnosis not present

## 2017-08-12 DIAGNOSIS — L578 Other skin changes due to chronic exposure to nonionizing radiation: Secondary | ICD-10-CM | POA: Diagnosis not present

## 2017-08-13 ENCOUNTER — Other Ambulatory Visit: Payer: Self-pay | Admitting: Family Medicine

## 2017-08-13 NOTE — Telephone Encounter (Signed)
Last OV 11/11/2016   Last refilled 11/04/2016 disp 270 with 1 refill   No future appt's schedule sent to PCP for approval

## 2017-08-13 NOTE — Telephone Encounter (Signed)
I sent this to [facility]

## 2017-08-26 ENCOUNTER — Encounter: Payer: Self-pay | Admitting: Family Medicine

## 2017-08-26 DIAGNOSIS — K862 Cyst of pancreas: Secondary | ICD-10-CM | POA: Diagnosis not present

## 2017-08-26 DIAGNOSIS — I10 Essential (primary) hypertension: Secondary | ICD-10-CM | POA: Diagnosis not present

## 2017-08-26 DIAGNOSIS — R933 Abnormal findings on diagnostic imaging of other parts of digestive tract: Secondary | ICD-10-CM | POA: Diagnosis not present

## 2017-08-30 ENCOUNTER — Encounter: Payer: Self-pay | Admitting: Family Medicine

## 2017-08-30 ENCOUNTER — Ambulatory Visit (INDEPENDENT_AMBULATORY_CARE_PROVIDER_SITE_OTHER): Payer: Medicare Other | Admitting: Family Medicine

## 2017-08-30 VITALS — BP 140/80 | Temp 98.2°F | Wt 154.6 lb

## 2017-08-30 DIAGNOSIS — T7840XA Allergy, unspecified, initial encounter: Secondary | ICD-10-CM | POA: Diagnosis not present

## 2017-08-30 MED ORDER — DOXYCYCLINE HYCLATE 100 MG PO TABS: ORAL_TABLET | ORAL | 0 refills | Status: DC

## 2017-08-30 NOTE — Progress Notes (Signed)
   Subjective:    Patient ID: Karen Watson, female    DOB: 1940/12/08, 77 y.o.   MRN: [id number]  HPI Here for an apparent allergic reaction to Amoxicillin. On 08-26-17 she was prescribed Amoxicillin to take prior to an upper endoscopy per [place]. She requires antibiotic prophylaxis due to a hip replacement. Unfortunately she has known reactions to penicillin and amoxicillin. She was able to have the procedure but a few hours later she developed a body wide rash with intense itching. She had some nausea but no vomting. No SOB or lip swelling. That night she took  Benadryl which helped. The rash has been slowly fading away since then. She is scheduled for a dental cleaning soon.    Review of Systems  Constitutional: Negative.   Respiratory: Negative.   Cardiovascular: Negative.   Skin: Positive for rash.       Objective:   Physical Exam  Constitutional: She appears well-developed and well-nourished.  Cardiovascular: Normal rate, regular rhythm, normal heart sounds and intact distal pulses.  Pulmonary/Chest: Effort normal and breath sounds normal. No respiratory distress. She has no wheezes. She has no rales.  Skin:  Diffuse red maculopapular rash, including the face and scalp.           Assessment & Plan:  Allergic reaction to Amoxicillin. She can take Zyrtec 10 mg bid for a few days and apply Triamcinolone cream. Alysia Penna, MD

## 2017-09-10 ENCOUNTER — Ambulatory Visit (INDEPENDENT_AMBULATORY_CARE_PROVIDER_SITE_OTHER): Payer: Medicare Other | Admitting: Family Medicine

## 2017-09-10 ENCOUNTER — Encounter: Payer: Self-pay | Admitting: Family Medicine

## 2017-09-10 VITALS — BP 140/82 | HR 74 | Temp 98.1°F | Ht 66.0 in | Wt 159.2 lb

## 2017-09-10 DIAGNOSIS — L509 Urticaria, unspecified: Secondary | ICD-10-CM

## 2017-09-10 MED ORDER — PREDNISONE 10 MG PO TABS
ORAL_TABLET | ORAL | 0 refills | Status: DC
Start: 2017-09-10 — End: 2018-01-20

## 2017-09-10 NOTE — Progress Notes (Signed)
   Subjective:    Patient ID: Karen Watson, female    DOB: 1940-07-08, 77 y.o.   MRN: [id number]  HPI Here for continued rash. She was seen on 08-30-17 for this rash, and it was felt to be an allergic reaction to Amoxicillin she had been given prior to a procedure. That day she was told to take Zyrtec bid and to apply Triamcinolone cream. This helped a little, but she still has body wide rash with itching.    Review of Systems  Constitutional: Negative.   Respiratory: Negative.   Cardiovascular: Negative.   Skin: Positive for rash.       Objective:   Physical Exam  Constitutional: She appears well-developed and well-nourished.  Cardiovascular: Normal rate, regular rhythm, normal heart sounds and intact distal pulses.  Pulmonary/Chest: Effort normal and breath sounds normal. No respiratory distress. She has no wheezes. She has no rales.  Skin:  The entire body below the neck has macular and papular erythema          Assessment & Plan:  Hives. She will continue the Zyrtec and we will add a 16 day taper of Prednisone beginning with 40 mg a day. Recheck prn.  Alysia Penna, MD

## 2017-11-23 ENCOUNTER — Other Ambulatory Visit: Payer: Self-pay | Admitting: Family Medicine

## 2017-11-23 NOTE — Telephone Encounter (Signed)
Last OV 09/10/2017  Sent to PCP to advise medication is NOT listed on pt's current medication list.

## 2017-11-29 DIAGNOSIS — Z7189 Other specified counseling: Secondary | ICD-10-CM | POA: Diagnosis not present

## 2017-11-29 DIAGNOSIS — D3A00 Benign carcinoid tumor of unspecified site: Secondary | ICD-10-CM | POA: Diagnosis not present

## 2017-12-21 ENCOUNTER — Ambulatory Visit (INDEPENDENT_AMBULATORY_CARE_PROVIDER_SITE_OTHER): Payer: Medicare Other | Admitting: Family Medicine

## 2017-12-21 ENCOUNTER — Encounter: Payer: Self-pay | Admitting: Family Medicine

## 2017-12-21 VITALS — BP 120/74 | HR 71 | Temp 98.4°F | Ht 66.0 in | Wt 157.2 lb

## 2017-12-21 DIAGNOSIS — M25512 Pain in left shoulder: Secondary | ICD-10-CM

## 2017-12-21 DIAGNOSIS — E876 Hypokalemia: Secondary | ICD-10-CM | POA: Diagnosis not present

## 2017-12-21 DIAGNOSIS — I10 Essential (primary) hypertension: Secondary | ICD-10-CM

## 2017-12-21 LAB — BASIC METABOLIC PANEL
BUN: 15 mg/dL (ref 6–23)
CALCIUM: 9.9 mg/dL (ref 8.4–10.5)
CO2: 33 meq/L — AB (ref 19–32)
CREATININE: 0.85 mg/dL (ref 0.40–1.20)
Chloride: 103 mEq/L (ref 96–112)
GFR: 69.13 mL/min (ref >60.00)
GLUCOSE: 77 mg/dL (ref 70–99)
Potassium: 3.5 mEq/L (ref 3.5–5.1)
Sodium: 143 mEq/L (ref 135–145)

## 2017-12-21 NOTE — Progress Notes (Signed)
   Subjective:    Patient ID: Karen Watson, female    DOB: Mar 12, 1941, 77 y.o.   MRN: [id number]  HPI Here for 2 issues. First she wants to check her potassium level. When we checked it on 10-22-17 it was in range at 3.7. Then it was checked at Dr. Algis Greenhouse office on 11-29-17 and it was low at 3.0. He told her to take her potassium pill BID for one week and then to go back to once a day. She did so, and now it needs to be checked again. Also she asks to check her left shoulder. About a month ago she developed a severe sharp pain in the anterior shoulder that would appear only when she made certain movements with her arm. Otherwise it felt fine. No prior hx of trauma. However she then did fall at home several days ago and she landed directly on the left shoulder. Since then it has felt great and she cannot reproduce the pain at all.    Review of Systems  Constitutional: Negative.   Respiratory: Negative.   Cardiovascular: Negative.   Musculoskeletal: Positive for arthralgias.  Neurological: Negative.        Objective:   Physical Exam  Constitutional: She is oriented to person, place, and time. She appears well-developed and well-nourished.  Cardiovascular: Normal rate, regular rhythm, normal heart sounds and intact distal pulses.  Pulmonary/Chest: Effort normal and breath sounds normal. No stridor. No respiratory distress. She has no wheezes. She has no rales.  Musculoskeletal:  The left shoulder is completely normal, no tenderness or crepitus, ROM is full with no pain   Neurological: She is alert and oriented to person, place, and time.          Assessment & Plan:  For the hypokalemia, she will have another BMET drawn today. I do not have a good explanation for the mysterious shoulder pain, but a possible one may be a labral tear. She may have repositioned the flap into the proper place and thus solved her pain problem. At any rate, she will follow as needed.  Alysia Penna, MD

## 2017-12-24 MED ORDER — POTASSIUM CHLORIDE CRYS ER 20 MEQ PO TBCR: 20.0000 meq | EXTENDED_RELEASE_TABLET | Freq: Two times a day (BID) | ORAL | 3 refills | Status: DC

## 2017-12-24 NOTE — Addendum Note (Signed)
Addended by: Agnes Lawrence on: 12/24/2017 01:49 PM   Modules accepted: Orders

## 2018-01-12 ENCOUNTER — Encounter: Payer: Medicare Other | Admitting: Family Medicine

## 2018-01-20 ENCOUNTER — Ambulatory Visit (INDEPENDENT_AMBULATORY_CARE_PROVIDER_SITE_OTHER): Payer: Medicare Other | Admitting: Family Medicine

## 2018-01-20 ENCOUNTER — Encounter: Payer: Self-pay | Admitting: Family Medicine

## 2018-01-20 VITALS — BP 140/80 | HR 78 | Temp 97.7°F | Ht 64.5 in | Wt 158.0 lb

## 2018-01-20 DIAGNOSIS — F5101 Primary insomnia: Secondary | ICD-10-CM

## 2018-01-20 DIAGNOSIS — M1611 Unilateral primary osteoarthritis, right hip: Secondary | ICD-10-CM

## 2018-01-20 DIAGNOSIS — M542 Cervicalgia: Secondary | ICD-10-CM

## 2018-01-20 DIAGNOSIS — I872 Venous insufficiency (chronic) (peripheral): Secondary | ICD-10-CM

## 2018-01-20 DIAGNOSIS — I10 Essential (primary) hypertension: Secondary | ICD-10-CM | POA: Diagnosis not present

## 2018-01-20 DIAGNOSIS — G8929 Other chronic pain: Secondary | ICD-10-CM

## 2018-01-20 DIAGNOSIS — F411 Generalized anxiety disorder: Secondary | ICD-10-CM

## 2018-01-20 DIAGNOSIS — F418 Other specified anxiety disorders: Secondary | ICD-10-CM

## 2018-01-20 DIAGNOSIS — M5440 Lumbago with sciatica, unspecified side: Secondary | ICD-10-CM

## 2018-01-20 LAB — POC URINALSYSI DIPSTICK (AUTOMATED)
Bilirubin, UA: NEGATIVE
Blood, UA: NEGATIVE
GLUCOSE UA: NEGATIVE
Ketones, UA: NEGATIVE
Leukocytes, UA: NEGATIVE
Nitrite, UA: NEGATIVE
Protein, UA: NEGATIVE
SPEC GRAV UA: 1.02 (ref 1.010–1.025)
UROBILINOGEN UA: 0.2 U/dL
pH, UA: 8 (ref 5.0–8.0)

## 2018-01-20 LAB — BASIC METABOLIC PANEL
BUN: 15 mg/dL (ref 6–23)
CALCIUM: 10.8 mg/dL — AB (ref 8.4–10.5)
CO2: 30 meq/L (ref 19–32)
Chloride: 101 mEq/L (ref 96–112)
Creatinine, Ser: 0.93 mg/dL (ref 0.40–1.20)
GFR: 62.3 mL/min (ref >60.00)
Glucose, Bld: 95 mg/dL (ref 70–99)
POTASSIUM: 4 meq/L (ref 3.5–5.1)
SODIUM: 141 meq/L (ref 135–145)

## 2018-01-20 LAB — LIPID PANEL
CHOL/HDL RATIO: 4
Cholesterol: 191 mg/dL (ref 0–200)
HDL: 49.8 mg/dL (ref >39.00)
NONHDL: 141.05
TRIGLYCERIDES: 230 mg/dL — AB (ref 0.0–149.0)
VLDL: 46 mg/dL — ABNORMAL HIGH (ref 0.0–40.0)

## 2018-01-20 LAB — CBC WITH DIFFERENTIAL/PLATELET
BASOS PCT: 0.8 % (ref 0.0–3.0)
Basophils Absolute: 0.1 10*3/uL (ref 0.0–0.1)
EOS PCT: 4.3 % (ref 0.0–5.0)
Eosinophils Absolute: 0.3 10*3/uL (ref 0.0–0.7)
HEMATOCRIT: 43.7 % (ref 36.0–46.0)
HEMOGLOBIN: 14.5 g/dL (ref 12.0–15.0)
Lymphocytes Relative: 32.5 % (ref 12.0–46.0)
Lymphs Abs: 2.2 10*3/uL (ref 0.7–4.0)
MCHC: 33.3 g/dL (ref 30.0–36.0)
MCV: 94.8 fl (ref 78.0–100.0)
Monocytes Absolute: 0.5 10*3/uL (ref 0.1–1.0)
Monocytes Relative: 7.8 % (ref 3.0–12.0)
Neutro Abs: 3.6 10*3/uL (ref 1.4–7.7)
Neutrophils Relative %: 54.6 % (ref 43.0–77.0)
Platelets: 257 10*3/uL (ref 150.0–400.0)
RBC: 4.61 Mil/uL (ref 3.87–5.11)
RDW: 14.1 % (ref 11.5–15.5)
WBC: 6.6 10*3/uL (ref 4.0–10.5)

## 2018-01-20 LAB — HEPATIC FUNCTION PANEL
ALK PHOS: 78 U/L (ref 39–117)
ALT: 16 U/L (ref 0–35)
AST: 19 U/L (ref 0–37)
Albumin: 4.6 g/dL (ref 3.5–5.2)
BILIRUBIN DIRECT: 0.1 mg/dL (ref 0.0–0.3)
TOTAL PROTEIN: 7.1 g/dL (ref 6.0–8.3)
Total Bilirubin: 0.4 mg/dL (ref 0.2–1.2)

## 2018-01-20 LAB — TSH: TSH: 2.64 u[IU]/mL (ref 0.35–4.50)

## 2018-01-20 LAB — LDL CHOLESTEROL, DIRECT: Direct LDL: 103 mg/dL

## 2018-01-20 MED ORDER — METHYLPREDNISOLONE 4 MG PO TBPK: ORAL_TABLET | ORAL | 0 refills | Status: DC

## 2018-01-20 NOTE — Progress Notes (Signed)
Subjective:    Patient ID: Karen Watson, female    DOB: Dec 01, 1940, 78 y.o.   MRN: [id number]  HPI Here to follow up on issues. She has been well in general. Her BP is stable.herjoint pains flare up from time to time, though Gabapentin helps. She walks for exercise. She would to get the Shingrix vaccine.    Review of Systems  Constitutional: Negative.  Negative for activity change, appetite change, diaphoresis, fatigue, fever and unexpected weight change.  HENT: Negative.  Negative for congestion, ear pain, hearing loss, nosebleeds, sore throat, tinnitus, trouble swallowing and voice change.   Eyes: Negative.  Negative for photophobia, pain, discharge, redness and visual disturbance.  Respiratory: Negative.  Negative for apnea, cough, choking, chest tightness, shortness of breath, wheezing and stridor.   Cardiovascular: Negative.  Negative for chest pain, palpitations and leg swelling.  Gastrointestinal: Negative.  Negative for abdominal distention, abdominal pain, blood in stool, constipation, diarrhea, nausea, rectal pain and vomiting.  Genitourinary: Negative.  Negative for difficulty urinating, dysuria, enuresis, flank pain, frequency, hematuria, menstrual problem, urgency, vaginal bleeding, vaginal discharge and vaginal pain.  Musculoskeletal: Negative.  Negative for arthralgias, back pain, gait problem, joint swelling, myalgias, neck pain and neck stiffness.  Skin: Negative.  Negative for color change, pallor, rash and wound.  Neurological: Negative.  Negative for dizziness, tremors, seizures, syncope, speech difficulty, weakness, light-headedness, numbness and headaches.  Hematological: Negative for adenopathy. Does not bruise/bleed easily.  Psychiatric/Behavioral: Negative.  Negative for agitation, behavioral problems, confusion, dysphoric mood, hallucinations and sleep disturbance. The patient is not nervous/anxious.        Objective:   Physical Exam  Constitutional: She  appears well-developed and well-nourished. No distress.  HENT:  Head: Normocephalic and atraumatic.  Right Ear: External ear normal.  Left Ear: External ear normal.  Nose: Nose normal.  Mouth/Throat: Oropharynx is clear and moist. No oropharyngeal exudate.  Eyes: Pupils are equal, round, and reactive to light. Conjunctivae and EOM are normal. Right eye exhibits no discharge. Left eye exhibits no discharge. No scleral icterus.  Neck: Normal range of motion. Neck supple. No JVD present. No thyromegaly present.  Cardiovascular: Normal rate, regular rhythm, normal heart sounds and intact distal pulses. Exam reveals no gallop and no friction rub.  No murmur heard. Pulmonary/Chest: Effort normal and breath sounds normal. No stridor. No respiratory distress. She has no wheezes. She has no rales. She exhibits no tenderness. No breast tenderness, discharge or bleeding.  Abdominal: Soft. Normal appearance and bowel sounds are normal. She exhibits no distension, no abdominal bruit, no ascites and no mass. There is no hepatosplenomegaly. There is no tenderness. There is no rigidity, no rebound and no guarding. No hernia.  Genitourinary: Rectum normal. No breast tenderness, discharge or bleeding. Cervix exhibits no motion tenderness, no discharge and no friability. Right adnexum displays no mass, no tenderness and no fullness. Left adnexum displays no mass, no tenderness and no fullness. No erythema, tenderness or bleeding in the vagina.  Musculoskeletal: Normal range of motion. She exhibits no edema or tenderness.  Lymphadenopathy:    She has no cervical adenopathy.  Neurological: She is alert. She has normal reflexes. No cranial nerve deficit. She exhibits normal muscle tone. Coordination normal.  Skin: Skin is warm and dry. No rash noted. She is not diaphoretic. No erythema. No pallor.  Psychiatric: She has a normal mood and affect. Her behavior is normal. Judgment and thought content normal.  Assessment & Plan:  Her HTN is stable. Her osteoarthritis is fairly well controlled. Her anxiety and depression are well controlled. Her insomnia is reasonably controlled. Wrote for her to receive Shingrix at her pharmacy. Get fasting labs today. Alysia Penna, MD

## 2018-01-21 DIAGNOSIS — F418 Other specified anxiety disorders: Secondary | ICD-10-CM | POA: Insufficient documentation

## 2018-01-25 ENCOUNTER — Other Ambulatory Visit: Payer: Self-pay | Admitting: Family Medicine

## 2018-04-04 ENCOUNTER — Ambulatory Visit (INDEPENDENT_AMBULATORY_CARE_PROVIDER_SITE_OTHER): Payer: Medicare Other

## 2018-04-04 DIAGNOSIS — Z23 Encounter for immunization: Secondary | ICD-10-CM | POA: Diagnosis not present

## 2018-04-15 ENCOUNTER — Other Ambulatory Visit: Payer: Self-pay | Admitting: Family Medicine

## 2018-04-19 ENCOUNTER — Ambulatory Visit (INDEPENDENT_AMBULATORY_CARE_PROVIDER_SITE_OTHER): Payer: Medicare Other | Admitting: Family Medicine

## 2018-04-19 ENCOUNTER — Encounter: Payer: Self-pay | Admitting: Family Medicine

## 2018-04-19 VITALS — BP 140/70 | HR 76 | Temp 97.9°F | Resp 12 | Wt 160.4 lb

## 2018-04-19 DIAGNOSIS — J069 Acute upper respiratory infection, unspecified: Secondary | ICD-10-CM | POA: Diagnosis not present

## 2018-04-19 DIAGNOSIS — I10 Essential (primary) hypertension: Secondary | ICD-10-CM | POA: Diagnosis not present

## 2018-04-19 MED ORDER — BENZONATATE 100 MG PO CAPS: 200.0000 mg | ORAL_CAPSULE | Freq: Two times a day (BID) | ORAL | 0 refills | Status: AC | PRN

## 2018-04-19 MED ORDER — AMLODIPINE BESYLATE 5 MG PO TABS: 5.0000 mg | ORAL_TABLET | Freq: Every day | ORAL | 1 refills | Status: DC

## 2018-04-19 MED ORDER — ALBUTEROL SULFATE HFA 108 (90 BASE) MCG/ACT IN AERS: 2.0000 | INHALATION_SPRAY | Freq: Four times a day (QID) | RESPIRATORY_TRACT | 0 refills | Status: DC | PRN

## 2018-04-19 NOTE — Progress Notes (Signed)
ACUTE VISIT  HPI:  Chief Complaint  Patient presents with  . Cough    x 3 day, feels mucus in the chest but cough is nonproductive, slight drainage    Karen Watson is a 77 y.o.female here today complaining of 3 days of respiratory symptoms. Nonproductive cough and "scratchy throat." She is not sure about exacerbating or alleviating factors for cough.  Nasal congestion, mild rhinorrhea and postnasal drainage. She has not noted body aches. Temperature has been 74 F  URI   This is a new problem. The current episode started in the past 7 days. The problem has been unchanged. Associated symptoms include congestion, coughing, headaches and rhinorrhea. Pertinent negatives include no abdominal pain, chest pain, diarrhea, ear pain, nausea, plugged ear sensation, rash, sinus pain, sore throat, swollen glands, vomiting or wheezing. She has tried acetaminophen for the symptoms. The treatment provided mild relief.   Mild frontal pressure headache. No associated visual changes, nausea, or vomiting.  She is reporting history of "bronchitis", states that it usually she gets a prescription for prednisone when she presents with respiratory symptoms. She has not noted wheezing or dyspnea. No history of tobacco use.  No Hx of recent travel. Husband was recently diagnosed and treated for pneumonia, complicated with C. difficile infection. No known insect bite.  Hx of allergies: Denies but she is on Loratadine.  OTC medications for this problem: Tylenol and ricola.  She is also taking benzonatate that was prescribed to her husband.  Hypertension: She is requesting refill on amlodipine 5 mg, she has 1 tablet left. She is supposed to receive mail order but it will take a few days to get medication.  She is not checking BP at home. Last f/u 01/2018.   Review of Systems  Constitutional: Positive for fatigue and fever. Negative for activity change and appetite change.  HENT:  Positive for congestion, postnasal drip, rhinorrhea and sinus pressure. Negative for ear pain, mouth sores, sinus pain, sore throat, trouble swallowing and voice change.   Eyes: Negative for discharge, redness and itching.  Respiratory: Positive for cough. Negative for shortness of breath and wheezing.   Cardiovascular: Negative for chest pain.  Gastrointestinal: Negative for abdominal pain, diarrhea, nausea and vomiting.  Musculoskeletal: Negative for gait problem and myalgias.  Skin: Negative for rash.  Allergic/Immunologic: Positive for environmental allergies.  Neurological: Positive for headaches. Negative for syncope and weakness.      Current Outpatient Medications on File Prior to Visit  Medication Sig Dispense Refill  . acetaminophen (TYLENOL) 500 MG tablet Take 1,000 mg by mouth 3 (three) times daily as needed for mild pain.     Marland Kitchen b complex vitamins tablet Take 1 tablet by mouth daily.      . Calcium Carb-Cholecalciferol (CALCIUM 600 + D PO) Take 600 mg by mouth 2 (two) times daily.    . fish oil-omega-3 fatty acids 1000 MG capsule Take 1 g by mouth daily.     Marland Kitchen gabapentin (NEURONTIN) 300 MG capsule TAKE 1 CAPSULE BY MOUTH 3  TIMES DAILY 270 capsule 1  . loratadine (CLARITIN) 10 MG tablet Take 10 mg by mouth daily.    Marland Kitchen MAGNESIUM-ZINC PO Take 1 tablet by mouth every other day.     . methylPREDNISolone (MEDROL DOSEPAK) 4 MG TBPK tablet As directed 21 tablet 0  . MULTIPLE VITAMIN PO Take 1 tablet by mouth daily.     . potassium chloride SA (K-DUR,KLOR-CON) 20 MEQ tablet Take 1  tablet (20 mEq total) by mouth 2 (two) times daily. 180 tablet 3  . triamterene-hydrochlorothiazide (MAXZIDE-25) 37.5-25 MG tablet TAKE 1 TABLET BY MOUTH  DAILY 90 tablet 1  . vitamin C (ASCORBIC ACID) 500 MG tablet Take 500 mg by mouth daily as needed (in winter months).     . vitamin E 400 UNIT capsule Take 400 Units by mouth daily.       No current facility-administered medications on file prior to  visit.      Past Medical History:  Diagnosis Date  . Arthritis    "thumbs, neck, shoulders, lower back" (06/08/2012)  . Basal cell carcinoma 1995-present   "face and back" (06/08/2012)  . Chronic bronchitis ([place])    last winter  . Chronic kidney disease    states she is stage 3   . Complication of anesthesia 01/2003   developed Hives, vomiting, SOB, chest heaviness after gallbladder surgery was told they believe it was due to either ancef or carbocaine  . Constipation   . Diarrhea   . DJD (degenerative joint disease)    see Dr. Charlestine Night  . Family history of anesthesia complication    daughter had surgery that didn't require vent but she ended up on one but pt has no idea why.  Lestine Mount)    Dr. Erling Cruz; "frequent; w/weather changes" (06/08/2012)  . History of shingles   . Hypertension    takes Amlodipine and Maxzide daily  . Insomnia    not on any meds  . Joint pain   . Joint swelling   . Lung cancer, lower lobe ([place]) 08/1997   "lower right lobe" (06/08/2012)  . 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 Lung nodules    multpile  . Memory loss   . Nocturia   . Pneumonia    hx of about 5 yrs ago  . PONV (postoperative nausea and vomiting)   . Restless leg    takes tonic water at bedtime  . Restless leg   . Vertigo    doesn't take any meds  . Weakness    numbness and tingling right leg   Allergies  Allergen Reactions  . Carbocaine [Mepivacaine Hcl] Hives and Nausea And Vomiting  . Codeine Anaphylaxis, Shortness Of Breath, Nausea And Vomiting and Other (See Comments)    Respiratory distress; Low blood pressure. Pt tolerates percocet.  . Hydrocodone-Homatropine Anaphylaxis, Nausea And Vomiting and Shortness Of Breath    Dangerously low blood pressure, respiratory distress; heaviness in chest  . Metrizamide Hives    Pre-medicated with Benadryl 50mg  PO  . Propoxyphene N-Acetaminophen Anaphylaxis and Nausea And Vomiting     "Darvocet" t; Lightheadedness, confusion  . Robaxin [Methocarbamol] Other (See Comments)    "severe Altzheimer's symptoms"  . Tape Rash and Other (See Comments)    Surgical tape causes rash and big blisters  . Tramadol Other (See Comments)    Low respiratory rate, nausea, chest heaviness  . Vicodin [Hydrocodone-Acetaminophen] Anaphylaxis  . Cefazolin Hives, Nausea And Vomiting and Other (See Comments)    "Ancef"; Also, severe stomach pain  . Iodinated Diagnostic Agents Hives    Pre-medicated with Benadryl 50mg  PO  . Penicillins Hives, Nausea And Vomiting and Other (See Comments)    severe stomach pain, Has patient had a PCN reaction causing immediate rash, facial/tongue/throat swelling, SOB or lightheadedness with hypotension: Yes Has patient had a PCN reaction causing severe rash involving mucus membranes or skin necrosis: No Has  patient had a PCN reaction that required hospitalization No Has patient had a PCN reaction occurring within the last 10 years: Yes If all of the above answers are "NO", then may proceed with Cephalosporin use.   Marland Kitchen Amoxil [Amoxicillin]     Itching and rash over body, N & V, itchy scalp   . Nsaids Other (See Comments)    Bruising; use with caution    Social History   Socioeconomic History  . Marital status: Married    Spouse name: Not on file  . Number of children: Not on file  . Years of education: Not on file  . Highest education level: Not on file  Occupational History  . Not on file  Social Needs  . Financial resource strain: Not on file  . Food insecurity:    Worry: Not on file    Inability: Not on file  . Transportation needs:    Medical: Not on file    Non-medical: Not on file  Tobacco Use  . Smoking status: Never Smoker  . Smokeless tobacco: Never Used  Substance and Sexual Activity  . Alcohol use: No    Alcohol/week: 0.0 standard drinks  . Drug use: No  . Sexual activity: Yes  Lifestyle  . Physical activity:    Days per week: Not  on file    Minutes per session: Not on file  . Stress: Not on file  Relationships  . Social connections:    Talks on phone: Not on file    Gets together: Not on file    Attends religious service: Not on file    Active member of club or organization: Not on file    Attends meetings of clubs or organizations: Not on file    Relationship status: Not on file  Other Topics Concern  . Not on file  Social History Narrative   Married          Vitals:   04/19/18 1144  BP: 140/70  Pulse: 76  Resp: 12  Temp: 97.9 F (36.6 C)  SpO2: 98%   Body mass index is 27.11 kg/m.   Physical Exam  Nursing note and vitals reviewed. Constitutional: She is oriented to person, place, and time. She appears well-developed. She does not appear ill. No distress.  HENT:  Head: Normocephalic and atraumatic.  Right Ear: Tympanic membrane, external ear and ear canal normal.  Left Ear: Tympanic membrane, external ear and ear canal normal.  Nose: Rhinorrhea present. Right sinus exhibits no maxillary sinus tenderness and no frontal sinus tenderness. Left sinus exhibits no maxillary sinus tenderness and no frontal sinus tenderness.  Mouth/Throat: Oropharynx is clear and moist and mucous membranes are normal.  Postnasal drainage.  Eyes: Conjunctivae are normal.  Cardiovascular: Normal rate and regular rhythm.  No murmur heard. Respiratory: Effort normal and breath sounds normal. No stridor. No respiratory distress.  Prolonged expiration.  Lymphadenopathy:       Head (right side): No submandibular adenopathy present.       Head (left side): No submandibular adenopathy present.    She has no cervical adenopathy.  Neurological: She is alert and oriented to person, place, and time. She has normal strength.  Skin: Skin is warm. No rash noted. No erythema.  Psychiatric: She has a normal mood and affect. Her speech is normal.  Well groomed, good eye contact.    ASSESSMENT AND PLAN:   Karen Watson was seen  today for cough.  Diagnoses and all orders for this visit:  URI, acute  Symptoms suggests a viral etiology, I do not think antibiotic treatment is recommended at this time therefore I am recommending symptomatic treatment for now. Instructed to monitor for signs of complications, clearly instructed about warning signs. I also explained that cough and nasal congestion can last a few days and sometimes weeks. F/U as needed.   I do not think prednisone is needed at this time, we discussed some side effects. No wheezing today but prolonged expiration, she may have some bronchospastic component aggravating cough; so albuterol inhaler may help.  -     benzonatate (TESSALON) 100 MG capsule; Take 2 capsules (200 mg total) by mouth 2 (two) times daily as needed for up to 10 days.  Essential hypertension Otherwise adequately controlled. Recommend monitoring BP at home. Amlodipine prescription sent to her local pharmacy. Continue follow-up with PCP.  -     amLODipine (NORVASC) 5 MG tablet; Take 1 tablet (5 mg total) by mouth daily.  Other orders -     albuterol (PROVENTIL HFA;VENTOLIN HFA) 108 (90 Base) MCG/ACT inhaler; Inhale 2 puffs into the lungs every 6 (six) hours as needed for wheezing or shortness of breath.      Betty G. Martinique, MD  [place]. [place] office.

## 2018-04-19 NOTE — Patient Instructions (Addendum)
  [name] I have seen you today for an acute visit.  A few things to remember from today's visit:   No diagnosis found.   If medications prescribed today, they will not be refill upon request, a follow up appointment with PCP will be necessary to discuss continuation of of treatment if appropriate.   I do not think you need prednisone at this time.  Albuterol inh 2 puff every 6 hours for a week then as needed for wheezing or shortness of breath.   viral infections are self-limited and we treat each symptom depending of severity.  Over the counter medications as decongestants and cold medications usually help, they need to be taken with caution if there is a history of high blood pressure or palpitations. Tylenol and/or Ibuprofen also helps with most symptoms (headache, muscle aching, fever,etc) Plenty of fluids. Honey helps with cough. Steam inhalations helps with runny nose, nasal congestion, and may prevent sinus infections. Cough and nasal congestion could last a few days and sometimes weeks. Please follow in not any better in 1-2 weeks or if symptoms get worse.   In general please monitor for signs of worsening symptoms and seek immediate medical attention if any concerning.   I hope you get better soon!

## 2018-04-20 ENCOUNTER — Other Ambulatory Visit: Payer: Self-pay | Admitting: *Deleted

## 2018-04-20 DIAGNOSIS — I10 Essential (primary) hypertension: Secondary | ICD-10-CM

## 2018-04-20 MED ORDER — AMLODIPINE BESYLATE 5 MG PO TABS: 5.0000 mg | ORAL_TABLET | Freq: Every day | ORAL | 1 refills | Status: DC

## 2018-05-09 ENCOUNTER — Ambulatory Visit (INDEPENDENT_AMBULATORY_CARE_PROVIDER_SITE_OTHER): Payer: Medicare Other | Admitting: Family Medicine

## 2018-05-09 ENCOUNTER — Encounter: Payer: Self-pay | Admitting: Family Medicine

## 2018-05-09 VITALS — BP 142/72 | HR 72 | Temp 98.2°F | Wt 159.1 lb

## 2018-05-09 DIAGNOSIS — J069 Acute upper respiratory infection, unspecified: Secondary | ICD-10-CM | POA: Diagnosis not present

## 2018-05-09 NOTE — Progress Notes (Signed)
   Subjective:    Patient ID: Karen Watson, female    DOB: 18-Dec-1940, 77 y.o.   MRN: [id number]  HPI Here for one week of stuffy head, PND., and a dry cough. No fever.    Review of Systems  Constitutional: Negative.   HENT: Positive for congestion and postnasal drip. Negative for sinus pressure, sinus pain and sore throat.   Eyes: Negative.   Respiratory: Positive for cough.        Objective:   Physical Exam  Constitutional: She appears well-developed and well-nourished.  HENT:  Right Ear: External ear normal.  Left Ear: External ear normal.  Nose: Nose normal.  Mouth/Throat: Oropharynx is clear and moist.  Eyes: Conjunctivae are normal.  Neck: No thyromegaly present.  Pulmonary/Chest: Effort normal and breath sounds normal.  Lymphadenopathy:    She has no cervical adenopathy.          Assessment & Plan:  Viral URI. Take Mucinex and drink fluids.  Alysia Penna, MD

## 2018-05-31 DIAGNOSIS — D3A090 Benign carcinoid tumor of the bronchus and lung: Secondary | ICD-10-CM | POA: Diagnosis not present

## 2018-05-31 DIAGNOSIS — Z9889 Other specified postprocedural states: Secondary | ICD-10-CM | POA: Diagnosis not present

## 2018-05-31 DIAGNOSIS — D3A00 Benign carcinoid tumor of unspecified site: Secondary | ICD-10-CM | POA: Diagnosis not present

## 2018-08-16 ENCOUNTER — Other Ambulatory Visit: Payer: Self-pay | Admitting: Family Medicine

## 2018-09-13 ENCOUNTER — Other Ambulatory Visit: Payer: Self-pay | Admitting: Family Medicine

## 2018-09-13 DIAGNOSIS — I10 Essential (primary) hypertension: Secondary | ICD-10-CM

## 2018-11-14 ENCOUNTER — Other Ambulatory Visit: Payer: Self-pay | Admitting: Family Medicine

## 2018-12-13 DIAGNOSIS — Z79899 Other long term (current) drug therapy: Secondary | ICD-10-CM | POA: Diagnosis not present

## 2018-12-13 DIAGNOSIS — D3A090 Benign carcinoid tumor of the bronchus and lung: Secondary | ICD-10-CM | POA: Diagnosis not present

## 2019-01-05 ENCOUNTER — Telehealth: Payer: Self-pay | Admitting: Family Medicine

## 2019-01-05 NOTE — Telephone Encounter (Signed)
Patient would like a handicap sticker.

## 2019-01-09 NOTE — Telephone Encounter (Signed)
Please advise. Ok to fill out form?

## 2019-01-10 DIAGNOSIS — D225 Melanocytic nevi of trunk: Secondary | ICD-10-CM | POA: Diagnosis not present

## 2019-01-10 DIAGNOSIS — Z808 Family history of malignant neoplasm of other organs or systems: Secondary | ICD-10-CM | POA: Diagnosis not present

## 2019-01-10 DIAGNOSIS — L570 Actinic keratosis: Secondary | ICD-10-CM | POA: Diagnosis not present

## 2019-01-10 DIAGNOSIS — D2272 Melanocytic nevi of left lower limb, including hip: Secondary | ICD-10-CM | POA: Diagnosis not present

## 2019-01-10 NOTE — Telephone Encounter (Signed)
Patient is aware to pick this up. Nothing further needed.

## 2019-01-10 NOTE — Telephone Encounter (Signed)
Done, in your folder

## 2019-02-07 ENCOUNTER — Other Ambulatory Visit: Payer: Self-pay | Admitting: Family Medicine

## 2019-02-07 DIAGNOSIS — I10 Essential (primary) hypertension: Secondary | ICD-10-CM

## 2019-03-31 ENCOUNTER — Other Ambulatory Visit: Payer: Self-pay

## 2019-03-31 ENCOUNTER — Ambulatory Visit (INDEPENDENT_AMBULATORY_CARE_PROVIDER_SITE_OTHER): Payer: Medicare Other | Admitting: Family Medicine

## 2019-03-31 ENCOUNTER — Encounter: Payer: Self-pay | Admitting: Family Medicine

## 2019-03-31 VITALS — BP 128/70 | HR 78 | Temp 98.3°F | Wt 154.2 lb

## 2019-03-31 DIAGNOSIS — L0232 Furuncle of buttock: Secondary | ICD-10-CM | POA: Diagnosis not present

## 2019-03-31 DIAGNOSIS — Z23 Encounter for immunization: Secondary | ICD-10-CM | POA: Diagnosis not present

## 2019-03-31 NOTE — Progress Notes (Signed)
   Subjective:    Patient ID: Karen Watson, female    DOB: 14-Feb-1941, 78 y.o.   MRN: [id number]  HPI Here for 6 days of a painful lump at the anus which bleeds slightly from time to time. She last had hemorrhoid trouble many years ago. BMs are regular.    Review of Systems  Constitutional: Negative.   Respiratory: Negative.   Cardiovascular: Negative.   Gastrointestinal: Positive for anal bleeding and rectal pain. Negative for abdominal pain, blood in stool, constipation and diarrhea.       Objective:   Physical Exam Constitutional:      Appearance: Normal appearance.  Cardiovascular:     Rate and Rhythm: Normal rate and regular rhythm.     Pulses: Normal pulses.     Heart sounds: Normal heart sounds.  Pulmonary:     Effort: Pulmonary effort is normal.     Breath sounds: Normal breath sounds.  Genitourinary:    Comments: The anus is normal with no hemorrhoids. There is a small pustular lesion on the right buttock about 3 cm from the anus.  Neurological:     Mental Status: She is alert.           Assessment & Plan:  This is a tiny boil. With gentle pressure I was able to express the fluid out of it. She will keep the area clean. Recheck prn.  Alysia Penna, MD

## 2019-04-06 ENCOUNTER — Ambulatory Visit (INDEPENDENT_AMBULATORY_CARE_PROVIDER_SITE_OTHER): Payer: Medicare Other | Admitting: Family Medicine

## 2019-04-06 ENCOUNTER — Encounter: Payer: Self-pay | Admitting: Family Medicine

## 2019-04-06 ENCOUNTER — Other Ambulatory Visit: Payer: Self-pay

## 2019-04-06 VITALS — BP 110/70 | HR 73 | Temp 98.2°F | Ht 64.5 in | Wt 151.8 lb

## 2019-04-06 DIAGNOSIS — F5101 Primary insomnia: Secondary | ICD-10-CM

## 2019-04-06 DIAGNOSIS — R300 Dysuria: Secondary | ICD-10-CM | POA: Diagnosis not present

## 2019-04-06 DIAGNOSIS — G8929 Other chronic pain: Secondary | ICD-10-CM

## 2019-04-06 DIAGNOSIS — I10 Essential (primary) hypertension: Secondary | ICD-10-CM | POA: Diagnosis not present

## 2019-04-06 DIAGNOSIS — M5440 Lumbago with sciatica, unspecified side: Secondary | ICD-10-CM | POA: Diagnosis not present

## 2019-04-06 DIAGNOSIS — M129 Arthropathy, unspecified: Secondary | ICD-10-CM

## 2019-04-06 DIAGNOSIS — F418 Other specified anxiety disorders: Secondary | ICD-10-CM

## 2019-04-06 DIAGNOSIS — Z0000 Encounter for general adult medical examination without abnormal findings: Secondary | ICD-10-CM | POA: Diagnosis not present

## 2019-04-06 LAB — LIPID PANEL
Cholesterol: 211 mg/dL — ABNORMAL HIGH (ref 0–200)
HDL: 52.6 mg/dL (ref >39.00)
LDL Cholesterol: 130 mg/dL — ABNORMAL HIGH (ref 0–99)
NonHDL: 158.2
Total CHOL/HDL Ratio: 4
Triglycerides: 140 mg/dL (ref 0.0–149.0)
VLDL: 28 mg/dL (ref 0.0–40.0)

## 2019-04-06 LAB — CBC WITH DIFFERENTIAL/PLATELET
Basophils Absolute: 0.1 10*3/uL (ref 0.0–0.1)
Basophils Relative: 0.7 % (ref 0.0–3.0)
Eosinophils Absolute: 0.1 10*3/uL (ref 0.0–0.7)
Eosinophils Relative: 1 % (ref 0.0–5.0)
HCT: 46 % (ref 36.0–46.0)
Hemoglobin: 15.2 g/dL — ABNORMAL HIGH (ref 12.0–15.0)
Lymphocytes Relative: 34.4 % (ref 12.0–46.0)
Lymphs Abs: 2.5 10*3/uL (ref 0.7–4.0)
MCHC: 33 g/dL (ref 30.0–36.0)
MCV: 94.1 fl (ref 78.0–100.0)
Monocytes Absolute: 0.4 10*3/uL (ref 0.1–1.0)
Monocytes Relative: 5.9 % (ref 3.0–12.0)
Neutro Abs: 4.3 10*3/uL (ref 1.4–7.7)
Neutrophils Relative %: 58 % (ref 43.0–77.0)
Platelets: 279 10*3/uL (ref 150.0–400.0)
RBC: 4.89 Mil/uL (ref 3.87–5.11)
RDW: 14.4 % (ref 11.5–15.5)
WBC: 7.4 10*3/uL (ref 4.0–10.5)

## 2019-04-06 LAB — POCT URINALYSIS DIPSTICK

## 2019-04-06 LAB — HEPATIC FUNCTION PANEL
ALT: 16 U/L (ref 0–35)
AST: 19 U/L (ref 0–37)
Albumin: 4.7 g/dL (ref 3.5–5.2)
Alkaline Phosphatase: 93 U/L (ref 39–117)
Bilirubin, Direct: 0.1 mg/dL (ref 0.0–0.3)
Total Bilirubin: 0.7 mg/dL (ref 0.2–1.2)
Total Protein: 7.3 g/dL (ref 6.0–8.3)

## 2019-04-06 LAB — BASIC METABOLIC PANEL
BUN: 17 mg/dL (ref 6–23)
CO2: 30 mEq/L (ref 19–32)
Calcium: 10.5 mg/dL (ref 8.4–10.5)
Chloride: 99 mEq/L (ref 96–112)
Creatinine, Ser: 1.01 mg/dL (ref 0.40–1.20)
GFR: 53.12 mL/min — ABNORMAL LOW (ref >60.00)
Glucose, Bld: 86 mg/dL (ref 70–99)
Potassium: 4.2 mEq/L (ref 3.5–5.1)
Sodium: 139 mEq/L (ref 135–145)

## 2019-04-06 LAB — TSH: TSH: 1.22 u[IU]/mL (ref 0.35–4.50)

## 2019-04-06 MED ORDER — TRIAMCINOLONE ACETONIDE 0.1 % EX CREA: 1.0000 "application " | TOPICAL_CREAM | Freq: Two times a day (BID) | CUTANEOUS | 1 refills | Status: DC

## 2019-04-06 NOTE — Patient Instructions (Signed)
There are no preventive care reminders to display for this patient.  Depression screen [facility] 2/9 03/31/2019 01/20/2018 10/22/2016  Decreased Interest 0 0 0  Down, Depressed, Hopeless 0 0 0  PHQ - 2 Score 0 0 0

## 2019-04-06 NOTE — Progress Notes (Signed)
   Subjective:    Patient ID: Karen Watson, female    DOB: 1940/08/03, 78 y.o.   MRN: [id number]  HPI Here for a well exam. She is doing well in general. Her BP has been stable. Her back pain has been manageable. Her depression and anxiety are stable although she does get stressed dealing with her husband's evolving dementia issues.    Review of Systems  Constitutional: Negative.   HENT: Negative.   Eyes: Negative.   Respiratory: Negative.   Cardiovascular: Negative.   Gastrointestinal: Negative.   Genitourinary: Negative for decreased urine volume, difficulty urinating, dyspareunia, dysuria, enuresis, flank pain, frequency, hematuria, pelvic pain and urgency.  Musculoskeletal: Negative.   Skin: Negative.   Neurological: Negative.   Psychiatric/Behavioral: Negative.        Objective:   Physical Exam Constitutional:      General: She is not in acute distress.    Appearance: She is well-developed.  HENT:     Head: Normocephalic and atraumatic.     Right Ear: External ear normal.     Left Ear: External ear normal.     Nose: Nose normal.     Mouth/Throat:     Pharynx: No oropharyngeal exudate.  Eyes:     General: No scleral icterus.    Conjunctiva/sclera: Conjunctivae normal.     Pupils: Pupils are equal, round, and reactive to light.  Neck:     Musculoskeletal: Normal range of motion and neck supple.     Thyroid: No thyromegaly.     Vascular: No JVD.  Cardiovascular:     Rate and Rhythm: Normal rate and regular rhythm.     Heart sounds: Normal heart sounds. No murmur. No friction rub. No gallop.   Pulmonary:     Effort: Pulmonary effort is normal. No respiratory distress.     Breath sounds: Normal breath sounds. No wheezing or rales.  Chest:     Chest wall: No tenderness.  Abdominal:     General: Bowel sounds are normal. There is no distension.     Palpations: Abdomen is soft. There is no mass.     Tenderness: There is no abdominal tenderness. There is no guarding  or rebound.  Musculoskeletal: Normal range of motion.        General: No tenderness.  Lymphadenopathy:     Cervical: No cervical adenopathy.  Skin:    General: Skin is warm and dry.     Findings: No erythema or rash.  Neurological:     Mental Status: She is alert and oriented to person, place, and time.     Cranial Nerves: No cranial nerve deficit.     Motor: No abnormal muscle tone.     Coordination: Coordination normal.     Deep Tendon Reflexes: Reflexes are normal and symmetric. Reflexes normal.  Psychiatric:        Behavior: Behavior normal.        Thought Content: Thought content normal.        Judgment: Judgment normal.           Assessment & Plan:  Her HTN and depression and back pain and insomnia are stable.  . We discussed diet and exercise. Get fasting labs.  Alysia Penna, MD

## 2019-04-08 LAB — URINE CULTURE
MICRO NUMBER:: 993869
SPECIMEN QUALITY:: ADEQUATE

## 2019-04-10 ENCOUNTER — Other Ambulatory Visit: Payer: Self-pay

## 2019-04-10 LAB — POC URINALSYSI DIPSTICK (AUTOMATED)

## 2019-04-10 MED ORDER — CIPROFLOXACIN HCL 500 MG PO TABS: 500.0000 mg | ORAL_TABLET | Freq: Two times a day (BID) | ORAL | 0 refills | Status: DC

## 2019-05-03 ENCOUNTER — Other Ambulatory Visit: Payer: Self-pay | Admitting: Family Medicine

## 2019-05-03 DIAGNOSIS — I10 Essential (primary) hypertension: Secondary | ICD-10-CM

## 2019-06-14 DIAGNOSIS — Z86012 Personal history of benign carcinoid tumor: Secondary | ICD-10-CM | POA: Diagnosis not present

## 2019-06-14 DIAGNOSIS — R978 Other abnormal tumor markers: Secondary | ICD-10-CM | POA: Diagnosis not present

## 2019-06-14 DIAGNOSIS — D3A090 Benign carcinoid tumor of the bronchus and lung: Secondary | ICD-10-CM | POA: Diagnosis not present

## 2019-07-18 DIAGNOSIS — L570 Actinic keratosis: Secondary | ICD-10-CM | POA: Diagnosis not present

## 2019-10-18 ENCOUNTER — Other Ambulatory Visit: Payer: Self-pay | Admitting: Family Medicine

## 2020-01-30 DIAGNOSIS — L218 Other seborrheic dermatitis: Secondary | ICD-10-CM | POA: Diagnosis not present

## 2020-01-30 DIAGNOSIS — L820 Inflamed seborrheic keratosis: Secondary | ICD-10-CM | POA: Diagnosis not present

## 2020-01-30 DIAGNOSIS — L84 Corns and callosities: Secondary | ICD-10-CM | POA: Diagnosis not present

## 2020-01-30 DIAGNOSIS — Z808 Family history of malignant neoplasm of other organs or systems: Secondary | ICD-10-CM | POA: Diagnosis not present

## 2020-02-07 ENCOUNTER — Encounter: Payer: Self-pay | Admitting: Family Medicine

## 2020-02-07 ENCOUNTER — Other Ambulatory Visit: Payer: Self-pay

## 2020-02-07 ENCOUNTER — Ambulatory Visit (INDEPENDENT_AMBULATORY_CARE_PROVIDER_SITE_OTHER): Payer: Medicare Other | Admitting: Family Medicine

## 2020-02-07 VITALS — BP 118/62 | HR 77 | Temp 98.2°F | Wt 162.8 lb

## 2020-02-07 DIAGNOSIS — R300 Dysuria: Secondary | ICD-10-CM

## 2020-02-07 DIAGNOSIS — N390 Urinary tract infection, site not specified: Secondary | ICD-10-CM | POA: Diagnosis not present

## 2020-02-07 LAB — POCT URINALYSIS DIPSTICK
Bilirubin, UA: NEGATIVE
Glucose, UA: NEGATIVE
Ketones, UA: NEGATIVE
Nitrite, UA: NEGATIVE
Protein, UA: POSITIVE — AB
Spec Grav, UA: >=1.03 — AB (ref 1.010–1.025)
Urobilinogen, UA: 0.2 E.U./dL
pH, UA: 6 (ref 5.0–8.0)

## 2020-02-07 MED ORDER — CIPROFLOXACIN HCL 500 MG PO TABS: 500.0000 mg | ORAL_TABLET | Freq: Two times a day (BID) | ORAL | 0 refills | Status: DC

## 2020-02-07 NOTE — Progress Notes (Signed)
   Subjective:    Patient ID: Karen Watson, female    DOB: 06-03-1941, 79 y.o.   MRN: [id number]  HPI Here for 2 days of low back pain and urinary urgency with burning. No nausea or fever. She drinks plenty of water.    Review of Systems  Constitutional: Negative.   Respiratory: Negative.   Cardiovascular: Negative.   Genitourinary: Positive for dysuria, frequency and urgency. Negative for flank pain and hematuria.       Objective:   Physical Exam Constitutional:      Appearance: Normal appearance. She is not ill-appearing.  Cardiovascular:     Rate and Rhythm: Normal rate and regular rhythm.     Pulses: Normal pulses.     Heart sounds: Normal heart sounds.  Pulmonary:     Effort: Pulmonary effort is normal.     Breath sounds: Normal breath sounds.  Abdominal:     General: Abdomen is flat. Bowel sounds are normal. There is no distension.     Palpations: Abdomen is soft. There is no mass.     Tenderness: There is no abdominal tenderness. There is no guarding or rebound.     Hernia: No hernia is present.  Neurological:     Mental Status: She is alert.           Assessment & Plan:  UTI, treat with Cipro. Culture the sample.  Alysia Penna, MD

## 2020-02-09 LAB — URINE CULTURE
MICRO NUMBER:: 10841957
SPECIMEN QUALITY:: ADEQUATE

## 2020-02-14 ENCOUNTER — Telehealth: Payer: Self-pay | Admitting: Family Medicine

## 2020-02-14 NOTE — Telephone Encounter (Signed)
Patient is taking Cipro for a uti.  Patient states she thinks she needs another 7 days.  If patient needs to leave another urine specimen she will be here today at 2:00 with her husband.   Pharmacy- [facility]. At [place].

## 2020-02-16 MED ORDER — CIPROFLOXACIN HCL 500 MG PO TABS: 500.0000 mg | ORAL_TABLET | Freq: Two times a day (BID) | ORAL | 0 refills | Status: DC

## 2020-02-16 NOTE — Telephone Encounter (Signed)
Call in another 7 days of Cipro

## 2020-02-16 NOTE — Telephone Encounter (Signed)
Rx sent in. Spoke with the patient, she is aware.

## 2020-03-12 ENCOUNTER — Other Ambulatory Visit: Payer: Self-pay | Admitting: Family Medicine

## 2020-03-12 DIAGNOSIS — I10 Essential (primary) hypertension: Secondary | ICD-10-CM

## 2020-04-01 ENCOUNTER — Telehealth: Payer: Self-pay | Admitting: Family Medicine

## 2020-04-01 NOTE — Telephone Encounter (Signed)
This patient has an upcoming appointment on 04/08/20, does she need to come to the lab for a urine? Please advise.

## 2020-04-01 NOTE — Telephone Encounter (Signed)
patient  stated she has 3 UTI in the last 3 months antibiotic is not working please advise

## 2020-04-02 MED ORDER — CIPROFLOXACIN HCL 500 MG PO TABS
500.0000 mg | ORAL_TABLET | Freq: Two times a day (BID) | ORAL | 0 refills | Status: DC
Start: 2020-04-02 — End: 2020-04-04

## 2020-04-02 NOTE — Telephone Encounter (Signed)
The patient was calling back to see if Dr. Sarajane Jews is able to call her in anything for her UTI that has came back.   It's been over 24 hours and she still hasn't heard from anyone.  Please advise

## 2020-04-02 NOTE — Addendum Note (Signed)
Addended by: Alysia Penna A on: 04/02/2020 04:58 PM   Modules accepted: Orders

## 2020-04-02 NOTE — Telephone Encounter (Signed)
I sent in for more Cipro

## 2020-04-03 NOTE — Telephone Encounter (Signed)
Pt is calling in stating that she has had Rx Cipro 3 times and it not working and wanted to see if there is another antibiotic that will help her due to her reoccurring UTI's.  Pharm:  [facility].

## 2020-04-03 NOTE — Telephone Encounter (Signed)
Please advise 

## 2020-04-04 MED ORDER — SULFAMETHOXAZOLE-TRIMETHOPRIM 800-160 MG PO TABS: 1.0000 | ORAL_TABLET | Freq: Two times a day (BID) | ORAL | 0 refills | Status: DC

## 2020-04-04 NOTE — Addendum Note (Signed)
Addended by: Matilde Sprang on: 04/04/2020 08:58 AM   Modules accepted: Orders

## 2020-04-04 NOTE — Telephone Encounter (Signed)
Patient called and advised of the change in medication, she verbalized understanding, [facility]

## 2020-04-04 NOTE — Telephone Encounter (Signed)
I understand. Cancel the Cipro, instead call in Bactrim DS to take BID for 7 days

## 2020-04-08 ENCOUNTER — Other Ambulatory Visit: Payer: Self-pay

## 2020-04-08 ENCOUNTER — Ambulatory Visit (INDEPENDENT_AMBULATORY_CARE_PROVIDER_SITE_OTHER): Payer: Medicare Other | Admitting: Family Medicine

## 2020-04-08 ENCOUNTER — Encounter: Payer: Self-pay | Admitting: Family Medicine

## 2020-04-08 VITALS — BP 130/70 | HR 88 | Temp 98.0°F | Ht 64.5 in | Wt 166.8 lb

## 2020-04-08 DIAGNOSIS — M8949 Other hypertrophic osteoarthropathy, multiple sites: Secondary | ICD-10-CM

## 2020-04-08 DIAGNOSIS — F5101 Primary insomnia: Secondary | ICD-10-CM | POA: Diagnosis not present

## 2020-04-08 DIAGNOSIS — I10 Essential (primary) hypertension: Secondary | ICD-10-CM

## 2020-04-08 DIAGNOSIS — M5440 Lumbago with sciatica, unspecified side: Secondary | ICD-10-CM

## 2020-04-08 DIAGNOSIS — Z23 Encounter for immunization: Secondary | ICD-10-CM

## 2020-04-08 DIAGNOSIS — F418 Other specified anxiety disorders: Secondary | ICD-10-CM | POA: Diagnosis not present

## 2020-04-08 DIAGNOSIS — M159 Polyosteoarthritis, unspecified: Secondary | ICD-10-CM

## 2020-04-08 DIAGNOSIS — G8929 Other chronic pain: Secondary | ICD-10-CM

## 2020-04-08 DIAGNOSIS — M5441 Lumbago with sciatica, right side: Secondary | ICD-10-CM

## 2020-04-08 DIAGNOSIS — M150 Primary generalized (osteo)arthritis: Secondary | ICD-10-CM

## 2020-04-08 MED ORDER — UREA 20 % EX CREA: TOPICAL_CREAM | CUTANEOUS | 0 refills | Status: AC | PRN

## 2020-04-08 NOTE — Progress Notes (Signed)
   Subjective:    Patient ID: Karen Watson, female    DOB: [birth year], 79 y.o.   MRN: [id number]  HPI Here to follow up on issues. She is currently taking Bactrim DS for a UTI, and she says she is feeling much better. The urgency and burning have resolved. Her arthritis is stable and her back pain is stable. Her BP has been controlled at home.    Review of Systems  Constitutional: Negative.   HENT: Negative.   Eyes: Negative.   Respiratory: Negative.   Cardiovascular: Negative.   Gastrointestinal: Negative.   Genitourinary: Negative for decreased urine volume, difficulty urinating, dyspareunia, dysuria, enuresis, flank pain, frequency, hematuria, pelvic pain and urgency.  Musculoskeletal: Positive for arthralgias and back pain.  Skin: Negative.   Neurological: Negative.   Psychiatric/Behavioral: Negative.        Objective:   Physical Exam Constitutional:      General: She is not in acute distress.    Appearance: She is well-developed.  HENT:     Head: Normocephalic and atraumatic.     Right Ear: External ear normal.     Left Ear: External ear normal.     Nose: Nose normal.     Mouth/Throat:     Pharynx: No oropharyngeal exudate.  Eyes:     General: No scleral icterus.    Conjunctiva/sclera: Conjunctivae normal.     Pupils: Pupils are equal, round, and reactive to light.  Neck:     Thyroid: No thyromegaly.     Vascular: No JVD.  Cardiovascular:     Rate and Rhythm: Normal rate and regular rhythm.     Heart sounds: Normal heart sounds. No murmur heard.  No friction rub. No gallop.   Pulmonary:     Effort: Pulmonary effort is normal. No respiratory distress.     Breath sounds: Normal breath sounds. No wheezing or rales.  Chest:     Chest wall: No tenderness.  Abdominal:     General: Bowel sounds are normal. There is no distension.     Palpations: Abdomen is soft. There is no mass.     Tenderness: There is no abdominal tenderness. There is no guarding or rebound.    Musculoskeletal:        General: No tenderness. Normal range of motion.     Cervical back: Normal range of motion and neck supple.  Lymphadenopathy:     Cervical: No cervical adenopathy.  Skin:    General: Skin is warm and dry.     Findings: No erythema or rash.  Neurological:     Mental Status: She is alert and oriented to person, place, and time.     Cranial Nerves: No cranial nerve deficit.     Motor: No abnormal muscle tone.     Coordination: Coordination normal.     Deep Tendon Reflexes: Reflexes are normal and symmetric. Reflexes normal.  Psychiatric:        Behavior: Behavior normal.        Thought Content: Thought content normal.        Judgment: Judgment normal.           Assessment & Plan:  Her HTN is well controlled. Her OA and low back pain are stable. Get fasting labs today. She is past due for a mammogram so I reminded her to set this up. Her depression and anxiety are also stable.  Alysia Penna, MD

## 2020-04-08 NOTE — Addendum Note (Signed)
Addended by: Matilde Sprang on: 04/08/2020 11:02 AM   Modules accepted: Orders

## 2020-04-09 LAB — CBC WITH DIFFERENTIAL/PLATELET
Absolute Monocytes: 439 cells/uL (ref 200–950)
Basophils Absolute: 49 cells/uL (ref 0–200)
Basophils Relative: 0.8 %
Eosinophils Absolute: 92 cells/uL (ref 15–500)
Eosinophils Relative: 1.5 %
HCT: 42.6 % (ref 35.0–45.0)
Hemoglobin: 14.4 g/dL (ref 11.7–15.5)
Lymphs Abs: 1757 cells/uL (ref 850–3900)
MCH: 31.7 pg (ref 27.0–33.0)
MCHC: 33.8 g/dL (ref 32.0–36.0)
MCV: 93.8 fL (ref 80.0–100.0)
MPV: 10.4 fL (ref 7.5–12.5)
Monocytes Relative: 7.2 %
Neutro Abs: 3764 cells/uL (ref 1500–7800)
Neutrophils Relative %: 61.7 %
Platelets: 262 10*3/uL (ref 140–400)
RBC: 4.54 10*6/uL (ref 3.80–5.10)
RDW: 13.6 % (ref 11.0–15.0)
Total Lymphocyte: 28.8 %
WBC: 6.1 10*3/uL (ref 3.8–10.8)

## 2020-04-09 LAB — HEPATIC FUNCTION PANEL
AG Ratio: 1.8 (calc) (ref 1.0–2.5)
ALT: 18 U/L (ref 6–29)
AST: 24 U/L (ref 10–35)
Albumin: 4.6 g/dL (ref 3.6–5.1)
Alkaline phosphatase (APISO): 72 U/L (ref 37–153)
Bilirubin, Direct: 0.1 mg/dL (ref 0.0–0.2)
Globulin: 2.6 g/dL (calc) (ref 1.9–3.7)
Indirect Bilirubin: 0.5 mg/dL (calc) (ref 0.2–1.2)
Total Bilirubin: 0.6 mg/dL (ref 0.2–1.2)
Total Protein: 7.2 g/dL (ref 6.1–8.1)

## 2020-04-09 LAB — BASIC METABOLIC PANEL
BUN/Creatinine Ratio: 13 (calc) (ref 6–22)
BUN: 15 mg/dL (ref 7–25)
CO2: 27 mmol/L (ref 20–32)
Calcium: 10 mg/dL (ref 8.6–10.4)
Chloride: 101 mmol/L (ref 98–110)
Creat: 1.19 mg/dL — ABNORMAL HIGH (ref 0.60–0.93)
Glucose, Bld: 91 mg/dL (ref 65–99)
Potassium: 3.8 mmol/L (ref 3.5–5.3)
Sodium: 139 mmol/L (ref 135–146)

## 2020-04-09 LAB — LIPID PANEL
Cholesterol: 194 mg/dL (ref <200)
HDL: 44 mg/dL — ABNORMAL LOW (ref >=50)
LDL Cholesterol (Calc): 118 mg/dL (calc) — ABNORMAL HIGH
Non-HDL Cholesterol (Calc): 150 mg/dL (calc) — ABNORMAL HIGH (ref <130)
Total CHOL/HDL Ratio: 4.4 (calc) (ref <5.0)
Triglycerides: 199 mg/dL — ABNORMAL HIGH (ref <150)

## 2020-04-09 LAB — TSH: TSH: 1.41 mIU/L (ref 0.40–4.50)

## 2020-05-23 ENCOUNTER — Encounter: Payer: Self-pay | Admitting: Family Medicine

## 2020-05-23 ENCOUNTER — Ambulatory Visit (INDEPENDENT_AMBULATORY_CARE_PROVIDER_SITE_OTHER): Payer: Medicare Other | Admitting: Family Medicine

## 2020-05-23 ENCOUNTER — Other Ambulatory Visit: Payer: Self-pay

## 2020-05-23 VITALS — BP 140/70 | HR 71 | Ht 64.5 in | Wt 165.0 lb

## 2020-05-23 DIAGNOSIS — R42 Dizziness and giddiness: Secondary | ICD-10-CM

## 2020-05-23 DIAGNOSIS — R300 Dysuria: Secondary | ICD-10-CM

## 2020-05-23 LAB — POCT URINALYSIS DIPSTICK
Bilirubin, UA: NEGATIVE
Blood, UA: NEGATIVE
Glucose, UA: NEGATIVE
Ketones, UA: NEGATIVE
Leukocytes, UA: NEGATIVE
Nitrite, UA: NEGATIVE
Protein, UA: NEGATIVE
Spec Grav, UA: 1.025 (ref 1.010–1.025)
Urobilinogen, UA: 0.2 E.U./dL
pH, UA: 6 (ref 5.0–8.0)

## 2020-05-23 MED ORDER — MECLIZINE HCL 25 MG PO TABS: 25.0000 mg | ORAL_TABLET | ORAL | 5 refills | Status: DC | PRN

## 2020-05-23 NOTE — Progress Notes (Signed)
   Subjective:    Patient ID: Karen Watson, female    DOB: 1941-02-19, 79 y.o.   MRN: [id number]  HPI Here for 2 issues. First she wants to make sure the UTI we treated her for last August is gone, and also she has felt mild dizziness off and on for the past 3 weeks. No headache. She has mild dysequilibrium at times. She drinks plenty of water. Her BP has been stable.    Review of Systems  Constitutional: Negative.   Respiratory: Negative.   Cardiovascular: Negative.   Genitourinary: Negative.   Neurological: Positive for dizziness.       Objective:   Physical Exam Constitutional:      Appearance: Normal appearance.  Cardiovascular:     Rate and Rhythm: Normal rate and regular rhythm.     Pulses: Normal pulses.     Heart sounds: Normal heart sounds.  Pulmonary:     Effort: Pulmonary effort is normal.     Breath sounds: Normal breath sounds.  Neurological:     General: No focal deficit present.     Mental Status: She is alert and oriented to person, place, and time.           Assessment & Plan:  Her UTI has resolved. For the dizziness, she will try Meclizine as needed.  Alysia Penna, MD

## 2020-06-17 ENCOUNTER — Telehealth: Payer: Medicare Other | Admitting: Nurse Practitioner

## 2020-06-17 DIAGNOSIS — R300 Dysuria: Secondary | ICD-10-CM

## 2020-06-17 MED ORDER — SULFAMETHOXAZOLE-TRIMETHOPRIM 800-160 MG PO TABS: 1.0000 | ORAL_TABLET | Freq: Two times a day (BID) | ORAL | 0 refills | Status: DC

## 2020-06-17 NOTE — Progress Notes (Signed)
We are sorry that you are not feeling well.  Here is how we plan to help!  Based on what you shared with me it looks like you most likely have a simple urinary tract infection.  A UTI (Urinary Tract Infection) is a bacterial infection of the bladder.  Most cases of urinary tract infections are simple to treat but a key part of your care is to encourage you to drink plenty of fluids and watch your symptoms carefully.  I have prescribed Bactrim DS One tablet twice a day for 5 days.  Your symptoms should gradually improve. Call us if the burning in your urine worsens, you develop worsening fever, back pain or pelvic pain or if your symptoms do not resolve after completing the antibiotic.  Urinary tract infections can be prevented by drinking plenty of water to keep your body hydrated.  Also be sure when you wipe, wipe from front to back and don't hold it in!  If possible, empty your bladder every 4 hours.  Your e-visit answers were reviewed by a board certified advanced clinical practitioner to complete your personal care plan.  Depending on the condition, your plan could have included both over the counter or prescription medications.  If there is a problem please reply  once you have received a response from your provider.  Your safety is important to [place].  If you have drug allergies check your prescription carefully.    You can use MyChart to ask questions about today's visit, request a non-urgent call back, or ask for a work or school excuse for 24 hours related to this e-Visit. If it has been greater than 24 hours you will need to follow up with your provider, or enter a new e-Visit to address those concerns.   You will get an e-mail in the next two days asking about your experience.  I hope that your e-visit has been valuable and will speed your recovery. Thank you for using e-visits.   5-10 minutes spent reviewing and documenting in chart.

## 2020-07-01 ENCOUNTER — Other Ambulatory Visit: Payer: Medicare Other

## 2020-07-01 DIAGNOSIS — Z20822 Contact with and (suspected) exposure to covid-19: Secondary | ICD-10-CM

## 2020-07-03 LAB — SARS-COV-2, NAA 2 DAY TAT

## 2020-07-03 LAB — NOVEL CORONAVIRUS, NAA: SARS-CoV-2, NAA: NOT DETECTED

## 2020-07-17 DIAGNOSIS — D3A090 Benign carcinoid tumor of the bronchus and lung: Secondary | ICD-10-CM | POA: Diagnosis not present

## 2020-07-17 DIAGNOSIS — R978 Other abnormal tumor markers: Secondary | ICD-10-CM | POA: Diagnosis not present

## 2020-07-17 DIAGNOSIS — K869 Disease of pancreas, unspecified: Secondary | ICD-10-CM | POA: Diagnosis not present

## 2020-07-17 DIAGNOSIS — R911 Solitary pulmonary nodule: Secondary | ICD-10-CM | POA: Diagnosis not present

## 2020-07-31 ENCOUNTER — Encounter: Payer: Self-pay | Admitting: Family Medicine

## 2020-07-31 DIAGNOSIS — Z20822 Contact with and (suspected) exposure to covid-19: Secondary | ICD-10-CM | POA: Diagnosis not present

## 2020-07-31 DIAGNOSIS — Z03818 Encounter for observation for suspected exposure to other biological agents ruled out: Secondary | ICD-10-CM | POA: Diagnosis not present

## 2020-07-31 NOTE — Telephone Encounter (Signed)
I think there is a good chance your test was a false negative. I would treat this as if it was Covid. Yes the Benzonatate would be good for both of them to take for the cough. She can have her own supply. Call in Benzonatate 200 mg to take every 6 hours as needed for cough, #60 with 2 rf

## 2020-08-01 ENCOUNTER — Other Ambulatory Visit: Payer: Self-pay

## 2020-08-01 MED ORDER — BENZONATATE 100 MG PO CAPS: 200.0000 mg | ORAL_CAPSULE | Freq: Four times a day (QID) | ORAL | 2 refills | Status: DC

## 2020-08-01 NOTE — Progress Notes (Signed)
be

## 2020-08-05 NOTE — Telephone Encounter (Signed)
I just now saw the message. Molnupiravir is indicated to be started within 5 days of the onset of symptoms, so it is too far out to give it now. Hopefully she is feeling better today

## 2020-08-06 ENCOUNTER — Telehealth: Payer: Self-pay

## 2020-08-06 NOTE — Telephone Encounter (Signed)
Daughter is calling in stating she has some concerned for her mom. Karen Watson called over to [place] she states that she is very "out of it" couldn't recall how to make a phone call. The medication she was prescribed is not helping her, and daughter states mom has been complaining of extreme diarrhea. Daughter is wondering if there is something else Dr.Fry could send in to help. Karen Watson also states she is going to by an oximeter to check O2 levels and will call back to give [place] those levels.

## 2020-08-07 NOTE — Telephone Encounter (Signed)
I am not sure what to do with this message. Does she need an OV?

## 2020-08-07 NOTE — Telephone Encounter (Signed)
Caryn Section at [phone], mailbox was full

## 2020-08-09 ENCOUNTER — Other Ambulatory Visit: Payer: Self-pay

## 2020-08-09 ENCOUNTER — Ambulatory Visit
Admission: EM | Admit: 2020-08-09 | Discharge: 2020-08-09 | Discharge status: Absent without leave | Payer: Medicare Other

## 2020-08-09 NOTE — Telephone Encounter (Signed)
I think right now it is a matter of time. Stay on current regimen. If her breathing becomes more difficult, she may need to go to urgent care

## 2020-08-10 DIAGNOSIS — E860 Dehydration: Secondary | ICD-10-CM | POA: Diagnosis not present

## 2020-08-10 DIAGNOSIS — U071 COVID-19: Secondary | ICD-10-CM | POA: Diagnosis not present

## 2020-08-10 DIAGNOSIS — R0902 Hypoxemia: Secondary | ICD-10-CM | POA: Diagnosis not present

## 2020-08-12 ENCOUNTER — Emergency Department (HOSPITAL_COMMUNITY): Payer: Medicare Other

## 2020-08-12 ENCOUNTER — Emergency Department (HOSPITAL_COMMUNITY)
Admission: EM | Admit: 2020-08-12 | Discharge: 2020-08-12 | Disposition: A | Discharge status: Home / Self Care | Payer: Medicare Other | Attending: Emergency Medicine | Admitting: Emergency Medicine

## 2020-08-12 ENCOUNTER — Other Ambulatory Visit: Payer: Self-pay

## 2020-08-12 ENCOUNTER — Encounter (HOSPITAL_COMMUNITY): Payer: Self-pay

## 2020-08-12 DIAGNOSIS — Z96641 Presence of right artificial hip joint: Secondary | ICD-10-CM | POA: Diagnosis not present

## 2020-08-12 DIAGNOSIS — K449 Diaphragmatic hernia without obstruction or gangrene: Secondary | ICD-10-CM | POA: Diagnosis not present

## 2020-08-12 DIAGNOSIS — I129 Hypertensive chronic kidney disease with stage 1 through stage 4 chronic kidney disease, or unspecified chronic kidney disease: Secondary | ICD-10-CM | POA: Diagnosis not present

## 2020-08-12 DIAGNOSIS — N183 Chronic kidney disease, stage 3 unspecified: Secondary | ICD-10-CM | POA: Diagnosis not present

## 2020-08-12 DIAGNOSIS — K2900 Acute gastritis without bleeding: Secondary | ICD-10-CM | POA: Diagnosis not present

## 2020-08-12 DIAGNOSIS — Z79899 Other long term (current) drug therapy: Secondary | ICD-10-CM | POA: Insufficient documentation

## 2020-08-12 DIAGNOSIS — R059 Cough, unspecified: Secondary | ICD-10-CM | POA: Diagnosis not present

## 2020-08-12 DIAGNOSIS — Z743 Need for continuous supervision: Secondary | ICD-10-CM | POA: Diagnosis not present

## 2020-08-12 DIAGNOSIS — R5383 Other fatigue: Secondary | ICD-10-CM | POA: Diagnosis not present

## 2020-08-12 DIAGNOSIS — R109 Unspecified abdominal pain: Secondary | ICD-10-CM | POA: Diagnosis present

## 2020-08-12 DIAGNOSIS — N1330 Unspecified hydronephrosis: Secondary | ICD-10-CM | POA: Diagnosis not present

## 2020-08-12 DIAGNOSIS — Z85828 Personal history of other malignant neoplasm of skin: Secondary | ICD-10-CM | POA: Diagnosis not present

## 2020-08-12 DIAGNOSIS — E860 Dehydration: Secondary | ICD-10-CM | POA: Diagnosis not present

## 2020-08-12 DIAGNOSIS — U071 COVID-19: Secondary | ICD-10-CM | POA: Diagnosis not present

## 2020-08-12 DIAGNOSIS — Z85118 Personal history of other malignant neoplasm of bronchus and lung: Secondary | ICD-10-CM | POA: Diagnosis not present

## 2020-08-12 DIAGNOSIS — R531 Weakness: Secondary | ICD-10-CM

## 2020-08-12 LAB — URINALYSIS, ROUTINE W REFLEX MICROSCOPIC
Bilirubin Urine: NEGATIVE
Glucose, UA: NEGATIVE mg/dL
Hgb urine dipstick: NEGATIVE
Ketones, ur: NEGATIVE mg/dL
Leukocytes,Ua: NEGATIVE
Nitrite: NEGATIVE
Protein, ur: NEGATIVE mg/dL
Specific Gravity, Urine: 1.005 (ref 1.005–1.030)
pH: 7 (ref 5.0–8.0)

## 2020-08-12 LAB — CBC WITH DIFFERENTIAL/PLATELET
Abs Immature Granulocytes: 0.03 10*3/uL (ref 0.00–0.07)
Basophils Absolute: 0 10*3/uL (ref 0.0–0.1)
Basophils Relative: 0 %
Eosinophils Absolute: 0 10*3/uL (ref 0.0–0.5)
Eosinophils Relative: 1 %
HCT: 41.2 % (ref 36.0–46.0)
Hemoglobin: 13.6 g/dL (ref 12.0–15.0)
Immature Granulocytes: 1 %
Lymphocytes Relative: 24 %
Lymphs Abs: 1.3 10*3/uL (ref 0.7–4.0)
MCH: 30.9 pg (ref 26.0–34.0)
MCHC: 33 g/dL (ref 30.0–36.0)
MCV: 93.6 fL (ref 80.0–100.0)
Monocytes Absolute: 0.5 10*3/uL (ref 0.1–1.0)
Monocytes Relative: 9 %
Neutro Abs: 3.5 10*3/uL (ref 1.7–7.7)
Neutrophils Relative %: 65 %
Platelets: 230 10*3/uL (ref 150–400)
RBC: 4.4 MIL/uL (ref 3.87–5.11)
RDW: 14 % (ref 11.5–15.5)
WBC: 5.4 10*3/uL (ref 4.0–10.5)
nRBC: 0 % (ref 0.0–0.2)

## 2020-08-12 LAB — LIPASE, BLOOD: Lipase: 36 U/L (ref 11–51)

## 2020-08-12 LAB — COMPREHENSIVE METABOLIC PANEL
ALT: 33 U/L (ref 0–44)
AST: 53 U/L — ABNORMAL HIGH (ref 15–41)
Albumin: 3.9 g/dL (ref 3.5–5.0)
Alkaline Phosphatase: 65 U/L (ref 38–126)
Anion gap: 13 (ref 5–15)
BUN: 14 mg/dL (ref 8–23)
CO2: 26 mmol/L (ref 22–32)
Calcium: 9 mg/dL (ref 8.9–10.3)
Chloride: 99 mmol/L (ref 98–111)
Creatinine, Ser: 1.07 mg/dL — ABNORMAL HIGH (ref 0.44–1.00)
GFR, Estimated: 53 mL/min — ABNORMAL LOW (ref >60)
Glucose, Bld: 93 mg/dL (ref 70–99)
Potassium: 3.9 mmol/L (ref 3.5–5.1)
Sodium: 138 mmol/L (ref 135–145)
Total Bilirubin: 1.1 mg/dL (ref 0.3–1.2)
Total Protein: 7.2 g/dL (ref 6.5–8.1)

## 2020-08-12 MED ORDER — METOCLOPRAMIDE HCL 10 MG PO TABS
10.0000 mg | ORAL_TABLET | Freq: Four times a day (QID) | ORAL | 0 refills | Status: DC
Start: 2020-08-12 — End: 2020-09-16

## 2020-08-12 MED ORDER — ONDANSETRON HCL 4 MG/2ML IJ SOLN
4.0000 mg | Freq: Once | INTRAMUSCULAR | Status: AC
  Administered 2020-08-12: 4 mg via INTRAVENOUS
  Filled 2020-08-12: qty 2

## 2020-08-12 MED ORDER — PANTOPRAZOLE SODIUM 20 MG PO TBEC: 40.0000 mg | DELAYED_RELEASE_TABLET | Freq: Every day | ORAL | 0 refills | Status: DC

## 2020-08-12 MED ORDER — DICYCLOMINE HCL 10 MG/ML IM SOLN
20.0000 mg | Freq: Once | INTRAMUSCULAR | Status: AC
  Administered 2020-08-12: 20 mg via INTRAMUSCULAR
  Filled 2020-08-12: qty 2

## 2020-08-12 MED ORDER — METOCLOPRAMIDE HCL 5 MG/ML IJ SOLN
10.0000 mg | Freq: Once | INTRAMUSCULAR | Status: AC
  Administered 2020-08-12: 10 mg via INTRAVENOUS
  Filled 2020-08-12: qty 2

## 2020-08-12 MED ORDER — ONDANSETRON 8 MG PO TBDP: 8.0000 mg | ORAL_TABLET | Freq: Four times a day (QID) | ORAL | 5 refills | Status: DC | PRN

## 2020-08-12 MED ORDER — SODIUM CHLORIDE 0.9 % IV BOLUS
1000.0000 mL | Freq: Once | INTRAVENOUS | Status: AC
  Administered 2020-08-12: 1000 mL via INTRAVENOUS

## 2020-08-12 MED ORDER — DICYCLOMINE HCL 20 MG PO TABS
20.0000 mg | ORAL_TABLET | Freq: Two times a day (BID) | ORAL | 0 refills | Status: DC
Start: 2020-08-12 — End: 2020-09-16

## 2020-08-12 MED ORDER — PANTOPRAZOLE SODIUM 40 MG IV SOLR
40.0000 mg | Freq: Once | INTRAVENOUS | Status: AC
  Administered 2020-08-12: 40 mg via INTRAVENOUS
  Filled 2020-08-12: qty 40

## 2020-08-12 NOTE — ED Provider Notes (Signed)
[facility] DEPT Provider Note   CSN: [id number] Arrival date & time: 08/12/20  1458     History Chief Complaint  Patient presents with  . Covid Positive    Karen Watson is a 80 y.o. female.  HPI     Diagnosed with COVID about 2 weeks ago Abdominal pain when trying to eat or drink, has been going on for about 2 weeks, sharp pain with eating and drinking, but is there all the time. Radiates to the back. Has had cholecystectomy hx. Taking zofran since Saturday, tessalon pearls for cough, tylenol for headache  Feeling dizzy, lightheaded Concerned that kidney function getting worse and dehydration Husband admitted to hospital for COVID 19   Past Medical History:  Diagnosis Date  . Arthritis    "thumbs, neck, shoulders, lower back" (06/08/2012)  . Basal cell carcinoma 1995-present   "face and back" (06/08/2012)  . Chronic bronchitis ([place])    last winter  . Chronic kidney disease    states she is stage 3   . Complication of anesthesia 01/2003   developed Hives, vomiting, SOB, chest heaviness after gallbladder surgery was told they believe it was due to either ancef or carbocaine  . Constipation   . Diarrhea   . DJD (degenerative joint disease)    see Dr. Charlestine Night  . Family history of anesthesia complication    daughter had surgery that didn't require vent but she ended up on one but pt has no idea why.  Lestine Mount)    Dr. Erling Cruz; "frequent; w/weather changes" (06/08/2012)  . History of shingles   . Hypertension    takes Amlodipine and Maxzide daily  . Insomnia    not on any meds  . Joint pain   . Joint swelling   . Lung cancer, lower lobe ([place]) 08/1997   "lower right lobe" (06/08/2012)  . Lung cancer, middle lobe ([place]) 06/1997   "right; inactive since 1999" (06/08/2012)  . Lung nodules    multpile  . Memory loss   . Nocturia   . Pneumonia    hx of about 5 yrs ago  . PONV (postoperative nausea and vomiting)   . Restless leg     takes tonic water at bedtime  . Restless leg   . Vertigo    doesn't take any meds  . Weakness    numbness and tingling right leg    Patient Active Problem List   Diagnosis Date Noted  . Depression with anxiety 01/21/2018  . Chronic venous insufficiency 04/29/2016  . Insomnia 02/12/2016  . Primary localized osteoarthritis of right hip 01/28/2016  . 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 S/P lumbar laminectomy 10/09/2015  . Osteoporosis 07/04/2015  . Arthropathy 04/30/2008  . NECK PAIN 10/21/2007  . Osteoarthritis 06/22/2007  . LEG CRAMPS 06/22/2007  . Essential hypertension 03/03/2007  . LOW BACK PAIN 03/03/2007  . HEADACHE 03/03/2007    Past Surgical History:  Procedure Laterality Date  . ANTERIOR LAT LUMBAR FUSION  06/08/2012   L2-3; L3-4  . ANTERIOR LAT LUMBAR FUSION  06/08/2012   Procedure: ANTERIOR LATERAL LUMBAR FUSION 2 LEVELS;  Surgeon: Eustace Moore, MD;  Location: [facility];  Service: Neurosurgery;  Laterality: Right;  Right Lumbar two-three, lumbar three-four extreme lateral interbody fusion with percutaneous pedicle screws  . BASAL CELL CARCINOMA EXCISION  1995-2011   "off back; @ least a couple/yr; Dr. Tonia Brooms" (06/08/2012)  . BREAST BIOPSY  07/2013   left breast  .  CATARACT EXTRACTION W/ INTRAOCULAR LENS  IMPLANT, BILATERAL  2011   per Dr. Ayesha Rumpf in [place]  . CHOLECYSTECTOMY  01/2003  . colonscopy  08-03-13   per Dr. Hilarie Fredrickson, clear, no repeats needed   . dexa  01/2009   normal  . DILATION AND CURETTAGE OF UTERUS  2000's  . ESOPHAGOGASTRODUODENOSCOPY    . LUMBAR LAMINECTOMY/DECOMPRESSION MICRODISCECTOMY Right 10/09/2015   Procedure: Right Lumbar Five-Sacral One Extraforaminal Foraminotomy with removal of synovial cyst;  Surgeon: Eustace Moore, MD;  Location: [facility];  Service: Neurosurgery;  Laterality: Right;  . LUMBAR PERCUTANEOUS PEDICLE SCREW 1 LEVEL  06/08/2012   Procedure: LUMBAR PERCUTANEOUS PEDICLE SCREW 1 LEVEL;  Surgeon: Eustace Moore, MD;  Location: [facility];  Service: Neurosurgery;  Laterality: Right;  Right Lumbar two-three, lumbar three-four extreme lateral interbody fusion with percutaneous pedicle screws  . LUNG LOBECTOMY  08/1997   RLL "for carcinoid" (06/08/2012)  . TONSILLECTOMY  1947  . TOTAL HIP ARTHROPLASTY Right 01/28/2016   Procedure: RIGHT TOTAL HIP ARTHROPLASTY ANTERIOR APPROACH;  Surgeon: Renette Butters, MD;  Location: [facility];  Service: Orthopedics;  Laterality: Right;  . TUBAL LIGATION  ~ 1976     OB History   No obstetric history on file.     Family History  Problem Relation Age of Onset  . Multiple sclerosis Brother   . Asthma Other   . Coronary artery disease Other   . Diabetes Other   . Hypertension Other   . Melanoma Other   . Osteoporosis Other   . Uterine cancer Other   . Kidney cancer Other   . Colon cancer Neg Hx     Social History   Tobacco Use  . Smoking status: Never Smoker  . Smokeless tobacco: Never Used  Substance Use Topics  . Alcohol use: No    Alcohol/week: 0.0 standard drinks  . Drug use: No    Home Medications Prior to Admission medications   Medication Sig Start Date End Date Taking? Authorizing Provider  dicyclomine (BENTYL) 20 MG tablet Take 1 tablet (20 mg total) by mouth 2 (two) times daily. 08/12/20  Yes Gareth Morgan, MD  metoCLOPramide (REGLAN) 10 MG tablet Take 1 tablet (10 mg total) by mouth every 6 (six) hours. 08/12/20  Yes Gareth Morgan, MD  pantoprazole (PROTONIX) 20 MG tablet Take 2 tablets (40 mg total) by mouth daily for 14 days. 08/12/20 08/26/20 Yes Gareth Morgan, MD  acetaminophen (TYLENOL) 500 MG tablet Take 1,000 mg by mouth 3 (three) times daily as needed for mild pain.     [provider]  amLODipine (NORVASC) 5 MG tablet TAKE 1 TABLET BY MOUTH  DAILY 03/13/20   Laurey Morale, MD  b complex vitamins tablet Take 1 tablet by mouth daily.      [provider]  benzonatate (TESSALON PERLES) 100 MG capsule Take  2 capsules (200 mg total) by mouth every 6 (six) hours. As needed for cough 08/01/20   Laurey Morale, MD  Calcium Carb-Cholecalciferol (CALCIUM 600 + D PO) Take 600 mg by mouth 2 (two) times daily.    [provider]  fish oil-omega-3 fatty acids 1000 MG capsule Take 1 g by mouth daily.     [provider]  gabapentin (NEURONTIN) 300 MG capsule TAKE 1 CAPSULE BY MOUTH 3  TIMES DAILY 03/13/20   Laurey Morale, MD  MAGNESIUM-ZINC PO Take 1 tablet by mouth every other day.     [provider]  meclizine (ANTIVERT) 25 MG tablet Take 1 tablet (25 mg total) by mouth every 4 (four) hours as needed for dizziness. 05/23/20   Laurey Morale, MD  MULTIPLE VITAMIN PO Take 1 tablet by mouth daily.     [provider]  ondansetron (ZOFRAN ODT) 8 MG disintegrating tablet Take 1 tablet (8 mg total) by mouth every 6 (six) hours as needed for nausea or vomiting. 08/12/20   Laurey Morale, MD  potassium chloride SA (KLOR-CON) 20 MEQ tablet TAKE 1 TABLET BY MOUTH  TWICE DAILY 10/19/19   Laurey Morale, MD  sulfamethoxazole-trimethoprim (BACTRIM DS) 800-160 MG tablet Take 1 tablet by mouth 2 (two) times daily. 06/17/20   Hassell Done Mary-Margaret, FNP  triamcinolone cream (KENALOG) 0.1 % Apply 1 application topically 2 (two) times daily. 04/06/19   Laurey Morale, MD  triamterene-hydrochlorothiazide (MAXZIDE-25) 37.5-25 MG tablet TAKE 1 TABLET BY MOUTH  DAILY 03/13/20   Laurey Morale, MD  urea (CARMOL) 20 % cream Apply topically as needed. 04/08/20   Laurey Morale, MD  vitamin C (ASCORBIC ACID) 500 MG tablet Take 500 mg by mouth daily as needed (in winter months).     [provider]  vitamin E 400 UNIT capsule Take 400 Units by mouth daily.      [provider]    Allergies    Carbocaine [mepivacaine hcl], Codeine, Hydrocodone-homatropine, Metrizamide, Propoxyphene n-acetaminophen, Robaxin [methocarbamol], Tape, Tramadol, Vicodin [hydrocodone-acetaminophen], Cefazolin,  Iodinated diagnostic agents, Penicillins, Amoxil [amoxicillin], and Nsaids  Review of Systems   Review of Systems  Constitutional: Positive for activity change, appetite change and fatigue. Negative for fever (99.8 in last few days, had it earlier in course).  HENT: Negative for sore throat. Rhinorrhea: very little.   Eyes: Negative for visual disturbance.  Respiratory: Negative for cough (improved) and shortness of breath (with exertion per daughter).   Cardiovascular: Negative for chest pain.  Gastrointestinal: Positive for abdominal pain and nausea. Negative for diarrhea (not since Saturday, was black with pepto but then stopped with imodium) and vomiting.  Genitourinary: Negative for difficulty urinating.  Musculoskeletal: Negative for back pain and neck pain. Gait problem: fatigue and gen weakness.  Skin: Negative for rash.  Neurological: Positive for light-headedness. Negative for syncope, facial asymmetry, weakness and headaches.    Physical Exam Updated Vital Signs BP 126/69 (BP Location: Right Arm)   Pulse 76   Temp 98.5 F (36.9 C) (Oral)   Resp 18   Ht 5\' 5"  (1.651 m)   Wt 72.6 kg   SpO2 95%   BMI 26.63 kg/m   Physical Exam Vitals and nursing note reviewed.  Constitutional:      General: She is not in acute distress.    Appearance: She is well-developed and well-nourished. She is not diaphoretic.  HENT:     Head: Normocephalic and atraumatic.  Eyes:     Extraocular Movements: EOM normal.     Conjunctiva/sclera: Conjunctivae normal.  Cardiovascular:     Rate and Rhythm: Normal rate and regular rhythm.     Pulses: Intact distal pulses.     Heart sounds: Normal heart sounds. No murmur heard. No friction rub. No gallop.   Pulmonary:     Effort: Pulmonary effort is normal. No respiratory distress.     Breath sounds: Normal breath sounds. No wheezing or rales.  Abdominal:     General: There is no distension.     Palpations: Abdomen is soft.     Tenderness: There  is abdominal tenderness (diffuse). There is no guarding.  Musculoskeletal:        General: No tenderness or edema.     Cervical back: Normal range of motion.  Skin:    General: Skin is warm and dry.     Findings: No erythema or rash.  Neurological:     Mental Status: She is alert and oriented to person, place, and time.     ED Results / Procedures / Treatments   Labs (all labs ordered are listed, but only abnormal results are displayed) Labs Reviewed  COMPREHENSIVE METABOLIC PANEL - Abnormal; Notable for the following components:      Result Value   Creatinine, Ser 1.07 (*)    AST 53 (*)    GFR, Estimated 53 (*)    All other components within normal limits  URINALYSIS, ROUTINE W REFLEX MICROSCOPIC - Abnormal; Notable for the following components:   Color, Urine STRAW (*)    All other components within normal limits  URINE CULTURE  CBC WITH DIFFERENTIAL/PLATELET  LIPASE, BLOOD    EKG None  Radiology CT ABDOMEN PELVIS WO CONTRAST  Result Date: 08/12/2020 CLINICAL DATA:  COVID positive with cough and poor appetite. EXAM: CT ABDOMEN AND PELVIS WITHOUT CONTRAST TECHNIQUE: Multidetector CT imaging of the abdomen and pelvis was performed following the standard protocol without IV contrast. COMPARISON:  None. FINDINGS: Lower chest: Mild to moderate severity infiltrates are seen within the bilateral lung bases. Hepatobiliary: No focal liver abnormality is seen. Status post cholecystectomy. No biliary dilatation. Pancreas: Unremarkable. No pancreatic ductal dilatation or surrounding inflammatory changes. Spleen: Normal in size without focal abnormality. Adrenals/Urinary Tract: Adrenal glands are unremarkable. Kidneys are normal in size. A 2.6 cm x 2.0 cm parapelvic cyst is seen on the left. There is mild bilateral hydronephrosis without evidence of renal calculi. Bladder is unremarkable. Stomach/Bowel: There is a small hiatal hernia. Appendix appears normal. No evidence of bowel wall  thickening, distention, or inflammatory changes. Vascular/Lymphatic: Aortic atherosclerosis. No enlarged abdominal or pelvic lymph nodes. Reproductive: A 1.3 cm x 1.0 cm partially calcified uterine fibroid is seen. The bilateral adnexa are unremarkable. Other: No abdominal wall hernia or abnormality. No abdominopelvic ascites. Musculoskeletal: A total right hip replacement is seen with associated streak artifact and subsequently limited evaluation of the adjacent osseous and soft tissue structures. Metallic density pedicle screws are seen at the levels of L2, L3 and L4 on the right. IMPRESSION: 1. Mild to moderate severity bibasilar infiltrates. 2. Mild bilateral hydronephrosis. 3. Small hiatal hernia. 4. Small partially calcified uterine fibroid. 5. Postoperative changes within the lumbar spine. 6. Aortic atherosclerosis. Aortic Atherosclerosis (ICD10-I70.0). Electronically Signed   By: Virgina Norfolk M.D.   On: 08/12/2020 19:11    Procedures Procedures   Medications Ordered in ED Medications  sodium chloride 0.9 % bolus 1,000 mL (0 mLs Intravenous Stopped 08/12/20 1954)  dicyclomine (BENTYL) injection 20 mg (20 mg Intramuscular Given 08/12/20 1639)  pantoprazole (PROTONIX) injection 40 mg (40 mg Intravenous Given 08/12/20 1741)  ondansetron (ZOFRAN) injection 4 mg (4 mg Intravenous Given 08/12/20 1737)  metoCLOPramide (REGLAN) injection 10 mg (10 mg Intravenous Given 08/12/20 2150)    ED Course  I have reviewed the triage vital signs and the nursing notes.  Pertinent labs & imaging results that were available during my care of the patient were reviewed by me and considered in my medical decision making (see chart for details).    MDM Rules/Calculators/A&P  80 year old female with a history of CKD, hypertension, lung cancer, basal cell carcinoma, presents with concern for generalized weakness, abdominal pain, nausea, decreased appetite, in setting of being diagnosed  with COVID 2 weeks ago.  Given abdominal tenderness on exam, CT abdomen pelvis was ordered which showed no evidence of small bowel obstruction, diverticulitis, or other acute abnormalities.  Do not feel history and exam are consistent with mesenteric ischemia, AAA.  She has history of cholecystectomy and has no evidence of cholangitis.  Most likely, she has gastritis related to COVID-19 infection causing abdominal pain.  Labs without acute findings, no anemia, no AKI or electrolyte abnormalities.  No sign of UTI.  Given IV fluids and nausea medications.    Ambulated with normal oxygen saturation. Discussed that CT abdomen does show bibasilar infiltrates which in this clinical scenario likely suggest COVID 19 pneumonia and have low clinical suspicion for other bacterial pneumonia and given no dyspnea, normal oxygenation, do not feel CXR will alter care. She is out of window of care for monoclonal ab, remdesivir.    Recommend continued supportive care, PCP follow up.  Given reglan, PPI, bentyl rx and discussed reasons to return. Patient discharged in stable condition with understanding of reasons to return.   Final Clinical Impression(s) / ED Diagnoses Final diagnoses:  COVID-19  Generalized weakness  Other acute gastritis without hemorrhage    Rx / DC Orders ED Discharge Orders         Ordered    metoCLOPramide (REGLAN) 10 MG tablet  Every 6 hours        08/12/20 2200    pantoprazole (PROTONIX) 20 MG tablet  Daily        08/12/20 2200    dicyclomine (BENTYL) 20 MG tablet  2 times daily        08/12/20 2200           Gareth Morgan, MD 08/13/20 0045

## 2020-08-12 NOTE — Telephone Encounter (Signed)
Done

## 2020-08-12 NOTE — ED Notes (Signed)
Pt placed on purewick at 75 mmHg.

## 2020-08-12 NOTE — ED Notes (Signed)
Patient sat up on side of bed and ambulate in room O2 sats.96-98%

## 2020-08-12 NOTE — ED Triage Notes (Signed)
Patient BIB [facility] for Covid + symptoms (cough, poor appetite and to check kidney functions). EMS reports family contacted PCP and was advised to being to ED. Patient was diagnosed with Covid 2 weeks ago.Patient's husband  was admitted to [facility] on Saturday for dehydration. Patient reports a mild productive cough and not eating as much as she should be. Patient denies any sob, dyspnea, chest pains or fever.

## 2020-08-14 LAB — URINE CULTURE: Culture: NO GROWTH

## 2020-08-19 ENCOUNTER — Encounter: Payer: Self-pay | Admitting: Family Medicine

## 2020-08-19 ENCOUNTER — Telehealth: Payer: Medicare Other | Admitting: Family Medicine

## 2020-08-19 ENCOUNTER — Telehealth (INDEPENDENT_AMBULATORY_CARE_PROVIDER_SITE_OTHER): Payer: Medicare Other | Admitting: Family Medicine

## 2020-08-19 VITALS — Temp 98.5°F | Wt 154.0 lb

## 2020-08-19 DIAGNOSIS — U071 COVID-19: Secondary | ICD-10-CM | POA: Diagnosis not present

## 2020-08-19 DIAGNOSIS — K219 Gastro-esophageal reflux disease without esophagitis: Secondary | ICD-10-CM | POA: Diagnosis not present

## 2020-08-19 NOTE — Progress Notes (Signed)
   Subjective:    Patient ID: Karen Watson, female    DOB: 1940/11/08, 80 y.o.   MRN: [id number]  HPI Virtual Visit via Telephone Note  I connected with the patient on 08/19/20 at  2:30 PM EST by telephone and verified that I am speaking with the correct person using two identifiers.   I discussed the limitations, risks, security and privacy concerns of performing an evaluation and management service by telephone and the availability of in person appointments. I also discussed with the patient that there may be a patient responsible charge related to this service. The patient expressed understanding and agreed to proceed.  Location patient: home Location provider: work or home office Participants present for the call: patient, provider Patient did not have a visit in the prior 7 days to address this/these issue(s).   History of Present Illness: Here to follow up on Covid pneumonia. She tested positive for the Covid virus (along with her husband) on 07-30-20. She had coughing, wheezing, and SOB. No chest pain. She was seen in the ED on 08-12-20 when she presented with abdominal pain. Her tests were unremarkable, and she was felt to have GERD. She was started on Pantoprazole 20 mg BID, and she feels better. The abdominal pain has resolved. She is eating regularly. She still has a dry cough, but this is slowly improving. Benzonatate helps. Her oxygen sats at home today are at 95%.    Observations/Objective: Patient sounds cheerful and well on the phone. I do not appreciate any SOB. Speech and thought processing are grossly intact. Patient reported vitals:  Assessment and Plan: She is slowly recovering from Covid pneumonia. She is also recovering from GERD. She Karen stay on her current medications and follow up as needed.  Alysia Penna, MD   Follow Up Instructions:     [phone] [phone] 11-20 [id number] 21-30 I did not refer this patient for an OV in the next 24 hours for this/these  issue(s).  I discussed the assessment and treatment plan with the patient. The patient was provided an opportunity to ask questions and all were answered. The patient agreed with the plan and demonstrated an understanding of the instructions.   The patient was advised to call back or seek an in-person evaluation if the symptoms worsen or if the condition fails to improve as anticipated.  I provided 25 minutes of non-face-to-face time during this encounter.   Alysia Penna, MD    Review of Systems     Objective:   Physical Exam        Assessment & Plan:

## 2020-09-16 ENCOUNTER — Ambulatory Visit (INDEPENDENT_AMBULATORY_CARE_PROVIDER_SITE_OTHER): Payer: Medicare Other | Admitting: Family Medicine

## 2020-09-16 ENCOUNTER — Other Ambulatory Visit: Payer: Self-pay

## 2020-09-16 ENCOUNTER — Encounter: Payer: Self-pay | Admitting: Family Medicine

## 2020-09-16 VITALS — BP 128/76 | HR 69 | Temp 97.8°F | Wt 157.2 lb

## 2020-09-16 DIAGNOSIS — J1282 Pneumonia due to coronavirus disease 2019: Secondary | ICD-10-CM

## 2020-09-16 DIAGNOSIS — Z8616 Personal history of COVID-19: Secondary | ICD-10-CM

## 2020-09-16 DIAGNOSIS — U071 COVID-19: Secondary | ICD-10-CM

## 2020-09-16 NOTE — Progress Notes (Signed)
   Subjective:    Patient ID: Karen Watson, female    DOB: 01/04/1941, 80 y.o.   MRN: [id number]  HPI Here to follow up Covid pneumonia. She went to urgent care on 08-09-20 for a cough and she tested positive. She then wen to the ED on 08-12-20 and received IV fluids. Since then she feels better and th cough has resolved. She still has fatigue however and low energy. Her appetite is good.    Review of Systems  Constitutional: Positive for fatigue.  Respiratory: Negative.   Cardiovascular: Negative.   Gastrointestinal: Negative.   Genitourinary: Negative.   Neurological: Negative.        Objective:   Physical Exam Constitutional:      Appearance: Normal appearance.  Cardiovascular:     Rate and Rhythm: Normal rate and regular rhythm.     Pulses: Normal pulses.     Heart sounds: Normal heart sounds.  Pulmonary:     Effort: Pulmonary effort is normal. No respiratory distress.     Breath sounds: Normal breath sounds. No stridor. No wheezing, rhonchi or rales.  Lymphadenopathy:     Cervical: No cervical adenopathy.  Neurological:     Mental Status: She is alert.           Assessment & Plan:  She is recovering from Covid pneumonia. She seems to be doing well. The fatigue should resolve over time. Recheck as needed. Alysia Penna, MD

## 2020-10-02 ENCOUNTER — Other Ambulatory Visit: Payer: Self-pay | Admitting: Family Medicine

## 2020-10-18 DIAGNOSIS — K862 Cyst of pancreas: Secondary | ICD-10-CM | POA: Diagnosis not present

## 2020-10-18 DIAGNOSIS — J984 Other disorders of lung: Secondary | ICD-10-CM | POA: Diagnosis not present

## 2020-10-18 DIAGNOSIS — I700 Atherosclerosis of aorta: Secondary | ICD-10-CM | POA: Diagnosis not present

## 2020-10-18 DIAGNOSIS — N281 Cyst of kidney, acquired: Secondary | ICD-10-CM | POA: Diagnosis not present

## 2020-10-18 DIAGNOSIS — I2510 Atherosclerotic heart disease of native coronary artery without angina pectoris: Secondary | ICD-10-CM | POA: Diagnosis not present

## 2020-10-18 DIAGNOSIS — K869 Disease of pancreas, unspecified: Secondary | ICD-10-CM | POA: Diagnosis not present

## 2020-10-18 DIAGNOSIS — Z9049 Acquired absence of other specified parts of digestive tract: Secondary | ICD-10-CM | POA: Diagnosis not present

## 2020-10-18 DIAGNOSIS — Z86012 Personal history of benign carcinoid tumor: Secondary | ICD-10-CM | POA: Diagnosis not present

## 2020-10-18 DIAGNOSIS — R918 Other nonspecific abnormal finding of lung field: Secondary | ICD-10-CM | POA: Diagnosis not present

## 2020-10-21 ENCOUNTER — Other Ambulatory Visit: Payer: Self-pay

## 2020-10-21 ENCOUNTER — Ambulatory Visit (INDEPENDENT_AMBULATORY_CARE_PROVIDER_SITE_OTHER): Payer: Medicare Other

## 2020-10-21 VITALS — BP 162/72 | HR 78 | Temp 98.0°F | Ht 65.0 in | Wt 157.0 lb

## 2020-10-21 DIAGNOSIS — Z780 Asymptomatic menopausal state: Secondary | ICD-10-CM

## 2020-10-21 DIAGNOSIS — Z0000 Encounter for general adult medical examination without abnormal findings: Secondary | ICD-10-CM | POA: Diagnosis not present

## 2020-10-21 NOTE — Patient Instructions (Signed)
[name] , Thank you for taking time to come for your Medicare Wellness Visit. I appreciate your ongoing commitment to your health goals. Please review the following plan we discussed and let me know if I can assist you in the future.   Screening recommendations/referrals: Colonoscopy: current no longer due  Mammogram: no longer due to age  Bone Density: referral completed 10/21/2020 Recommended yearly ophthalmology/optometry visit for glaucoma screening and checkup Recommended yearly dental visit for hygiene and checkup  Vaccinations: Influenza vaccine: current due fall 2022 Pneumococcal vaccine: completed series  Tdap vaccine: due with injury  Shingles vaccine: will obtain local pharmacy     Advanced directives: will provide copies   Conditions/risks identified: none   Next appointment: none    Preventive Care 74 Years and Older, Female Preventive care refers to lifestyle choices and visits with your health care provider that can promote health and wellness. What does preventive care include?  A yearly physical exam. This is also called an annual well check.  Dental exams once or twice a year.  Routine eye exams. Ask your health care provider how often you should have your eyes checked.  Personal lifestyle choices, including:  Daily care of your teeth and gums.  Regular physical activity.  Eating a healthy diet.  Avoiding tobacco and drug use.  Limiting alcohol use.  Practicing safe sex.  Taking low-dose aspirin every day.  Taking vitamin and mineral supplements as recommended by your health care provider. What happens during an annual well check? The services and screenings done by your health care provider during your annual well check will depend on your age, overall health, lifestyle risk factors, and family history of disease. Counseling  Your health care provider may ask you questions about your:  Alcohol use.  Tobacco use.  Drug use.  Emotional  well-being.  Home and relationship well-being.  Sexual activity.  Eating habits.  History of falls.  Memory and ability to understand (cognition).  Work and work Statistician.  Reproductive health. Screening  You may have the following tests or measurements:  Height, weight, and BMI.  Blood pressure.  Lipid and cholesterol levels. These may be checked every 5 years, or more frequently if you are over 58 years old.  Skin check.  Lung cancer screening. You may have this screening every year starting at age 36 if you have a 30-pack-year history of smoking and currently smoke or have quit within the past 15 years.  Fecal occult blood test (FOBT) of the stool. You may have this test every year starting at age 34.  Flexible sigmoidoscopy or colonoscopy. You may have a sigmoidoscopy every 5 years or a colonoscopy every 10 years starting at age 84.  Hepatitis C blood test.  Hepatitis B blood test.  Sexually transmitted disease (STD) testing.  Diabetes screening. This is done by checking your blood sugar (glucose) after you have not eaten for a while (fasting). You may have this done every 1-3 years.  Bone density scan. This is done to screen for osteoporosis. You may have this done starting at age 4.  Mammogram. This may be done every 1-2 years. Talk to your health care provider about how often you should have regular mammograms. Talk with your health care provider about your test results, treatment options, and if necessary, the need for more tests. Vaccines  Your health care provider may recommend certain vaccines, such as:  Influenza vaccine. This is recommended every year.  Tetanus, diphtheria, and acellular pertussis (Tdap,  Td) vaccine. You may need a Td booster every 10 years.  Zoster vaccine. You may need this after age 20.  Pneumococcal 13-valent conjugate (PCV13) vaccine. One dose is recommended after age 70.  Pneumococcal polysaccharide (PPSV23) vaccine. One  dose is recommended after age 52. Talk to your health care provider about which screenings and vaccines you need and how often you need them. This information is not intended to replace advice given to you by your health care provider. Make sure you discuss any questions you have with your health care provider. Document Released: 07/05/2015 Document Revised: 02/26/2016 Document Reviewed: 04/09/2015 Elsevier Interactive Patient Education  2017 [employer] Prevention in the Home Falls can cause injuries. They can happen to people of all ages. There are many things you can do to make your home safe and to help prevent falls. What can I do on the outside of my home?  Regularly fix the edges of walkways and driveways and fix any cracks.  Remove anything that might make you trip as you walk through a door, such as a raised step or threshold.  Trim any bushes or trees on the path to your home.  Use bright outdoor lighting.  Clear any walking paths of anything that might make someone trip, such as rocks or tools.  Regularly check to see if handrails are loose or broken. Make sure that both sides of any steps have handrails.  Any raised decks and porches should have guardrails on the edges.  Have any leaves, snow, or ice cleared regularly.  Use sand or salt on walking paths during winter.  Clean up any spills in your garage right away. This includes oil or grease spills. What can I do in the bathroom?  Use night lights.  Install grab bars by the toilet and in the tub and shower. Do not use towel bars as grab bars.  Use non-skid mats or decals in the tub or shower.  If you need to sit down in the shower, use a plastic, non-slip stool.  Keep the floor dry. Clean up any water that spills on the floor as soon as it happens.  Remove soap buildup in the tub or shower regularly.  Attach bath mats securely with double-sided non-slip rug tape.  Do not have throw rugs and other  things on the floor that can make you trip. What can I do in the bedroom?  Use night lights.  Make sure that you have a light by your bed that is easy to reach.  Do not use any sheets or blankets that are too big for your bed. They should not hang down onto the floor.  Have a firm chair that has side arms. You can use this for support while you get dressed.  Do not have throw rugs and other things on the floor that can make you trip. What can I do in the kitchen?  Clean up any spills right away.  Avoid walking on wet floors.  Keep items that you use a lot in easy-to-reach places.  If you need to reach something above you, use a strong step stool that has a grab bar.  Keep electrical cords out of the way.  Do not use floor polish or wax that makes floors slippery. If you must use wax, use non-skid floor wax.  Do not have throw rugs and other things on the floor that can make you trip. What can I do with my stairs?  Do not  leave any items on the stairs.  Make sure that there are handrails on both sides of the stairs and use them. Fix handrails that are broken or loose. Make sure that handrails are as long as the stairways.  Check any carpeting to make sure that it is firmly attached to the stairs. Fix any carpet that is loose or worn.  Avoid having throw rugs at the top or bottom of the stairs. If you do have throw rugs, attach them to the floor with carpet tape.  Make sure that you have a light switch at the top of the stairs and the bottom of the stairs. If you do not have them, ask someone to add them for you. What else can I do to help prevent falls?  Wear shoes that:  Do not have high heels.  Have rubber bottoms.  Are comfortable and fit you well.  Are closed at the toe. Do not wear sandals.  If you use a stepladder:  Make sure that it is fully opened. Do not climb a closed stepladder.  Make sure that both sides of the stepladder are locked into place.  Ask  someone to hold it for you, if possible.  Clearly mark and make sure that you can see:  Any grab bars or handrails.  First and last steps.  Where the edge of each step is.  Use tools that help you move around (mobility aids) if they are needed. These include:  Canes.  Walkers.  Scooters.  Crutches.  Turn on the lights when you go into a dark area. Replace any light bulbs as soon as they burn out.  Set up your furniture so you have a clear path. Avoid moving your furniture around.  If any of your floors are uneven, fix them.  If there are any pets around you, be aware of where they are.  Review your medicines with your doctor. Some medicines can make you feel dizzy. This can increase your chance of falling. Ask your doctor what other things that you can do to help prevent falls. This information is not intended to replace advice given to you by your health care provider. Make sure you discuss any questions you have with your health care provider. Document Released: 04/04/2009 Document Revised: 11/14/2015 Document Reviewed: 07/13/2014 Elsevier Interactive Patient Education  2017 [employer].

## 2020-10-21 NOTE — Progress Notes (Signed)
Subjective:   Karen Watson is a 80 y.o. female who presents for an Initial Medicare Annual Wellness Visit.  Review of Systems    n/a Cardiac Risk Factors include: advanced age (>75men, >58 women);hypertension;dyslipidemia     Objective:    Today's Vitals   10/21/20 1322  BP: (!) 162/72  Pulse: 78  Temp: 98 F (36.7 C)  SpO2: 98%  Weight: 157 lb (71.2 kg)  Height: 5\' 5"  (1.651 m)   Body mass index is 26.13 kg/m.  Advanced Directives 10/21/2020 08/12/2020 01/17/2016 10/03/2015 06/08/2012 06/08/2012 06/02/2012  Does Patient Have a Medical Advance Directive? Yes Yes Yes Yes - Patient has advance directive, copy in chart Patient has advance directive, copy in chart  Type of Advance Directive [place];Living will Out of facility DNR (pink MOST or yellow form) - [place];Living will - - Living will  Does patient want to make changes to medical advance directive? No - Patient declined Yes (ED - Information included in AVS) - No - Patient declined - - -  Copy of [place] in Chart? No - copy requested - No - copy requested Yes - - -  Pre-existing out of facility DNR order (yellow form or pink MOST form) - - - - No - No    Current Medications (verified) Outpatient Encounter Medications as of 10/21/2020  Medication Sig  . acetaminophen (TYLENOL) 500 MG tablet Take 1,000 mg by mouth 3 (three) times daily as needed for mild pain.   Marland Kitchen amLODipine (NORVASC) 5 MG tablet TAKE 1 TABLET BY MOUTH  DAILY  . b complex vitamins tablet Take 1 tablet by mouth daily.  . Calcium Carb-Cholecalciferol (CALCIUM 600 + D PO) Take 600 mg by mouth 2 (two) times daily.  . fish oil-omega-3 fatty acids 1000 MG capsule Take 1 g by mouth daily.  Marland Kitchen gabapentin (NEURONTIN) 300 MG capsule TAKE 1 CAPSULE BY MOUTH 3  TIMES DAILY  . MAGNESIUM-ZINC PO Take 1 tablet by mouth every other day.   . meclizine (ANTIVERT) 25 MG tablet Take 1 tablet (25 mg total) by mouth  every 4 (four) hours as needed for dizziness.  . Melatonin 10 MG CAPS Take 10 mg by mouth.  . MULTIPLE VITAMIN PO Take 1 tablet by mouth daily.  . potassium chloride SA (KLOR-CON) 20 MEQ tablet TAKE 1 TABLET BY MOUTH  TWICE DAILY  . triamcinolone cream (KENALOG) 0.1 % Apply 1 application topically 2 (two) times daily.  Marland Kitchen triamterene-hydrochlorothiazide (MAXZIDE-25) 37.5-25 MG tablet TAKE 1 TABLET BY MOUTH  DAILY  . urea (CARMOL) 20 % cream Apply topically as needed.  . vitamin C (ASCORBIC ACID) 500 MG tablet Take 500 mg by mouth daily as needed (in winter months).  . vitamin E 400 UNIT capsule Take 400 Units by mouth daily.  . pantoprazole (PROTONIX) 20 MG tablet Take 2 tablets (40 mg total) by mouth daily for 14 days.  . [DISCONTINUED] sulfamethoxazole-trimethoprim (BACTRIM DS) 800-160 MG tablet Take 1 tablet by mouth 2 (two) times daily.   No facility-administered encounter medications on file as of 10/21/2020.    Allergies (verified) Carbocaine [mepivacaine hcl], Codeine, Hydrocodone bit-homatrop mbr, Metrizamide, Propoxyphene n-acetaminophen, Robaxin [methocarbamol], Tape, Tramadol, Vicodin [hydrocodone-acetaminophen], Cefazolin, Iodinated diagnostic agents, Penicillins, Amoxil [amoxicillin], and Nsaids   History: Past Medical History:  Diagnosis Date  . Arthritis    "thumbs, neck, shoulders, lower back" (06/08/2012)  . Basal cell carcinoma 1995-present   "face and back" (06/08/2012)  .  Chronic bronchitis ([place])    last winter  . Chronic kidney disease    states she is stage 3   . Complication of anesthesia 01/2003   developed Hives, vomiting, SOB, chest heaviness after gallbladder surgery was told they believe it was due to either ancef or carbocaine  . Constipation   . Diarrhea   . DJD (degenerative joint disease)    see Dr. Charlestine Night  . Family history of anesthesia complication    daughter had surgery that didn't require vent but she ended up on one but pt has no idea why.  Lestine Mount)    Dr. Erling Cruz; "frequent; w/weather changes" (06/08/2012)  . History of shingles   . Hypertension    takes Amlodipine and Maxzide daily  . Insomnia    not on any meds  . Joint pain   . Joint swelling   . Lung cancer, lower lobe ([place]) 08/1997   "lower right lobe" (06/08/2012)  . 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 Lung nodules    multpile  . Memory loss   . Nocturia   . Pneumonia    hx of about 5 yrs ago  . PONV (postoperative nausea and vomiting)   . Restless leg    takes tonic water at bedtime  . Restless leg   . Vertigo    doesn't take any meds  . Weakness    numbness and tingling right leg   Past Surgical History:  Procedure Laterality Date  . ANTERIOR LAT LUMBAR FUSION  06/08/2012   L2-3; L3-4  . ANTERIOR LAT LUMBAR FUSION  06/08/2012   Procedure: ANTERIOR LATERAL LUMBAR FUSION 2 LEVELS;  Surgeon: Eustace Moore, MD;  Location: [facility];  Service: Neurosurgery;  Laterality: Right;  Right Lumbar two-three, lumbar three-four extreme lateral interbody fusion with percutaneous pedicle screws  . BASAL CELL CARCINOMA EXCISION  1995-2011   "off back; @ least a couple/yr; Dr. Tonia Brooms" (06/08/2012)  . BREAST BIOPSY  07/2013   left breast  . CATARACT EXTRACTION W/ INTRAOCULAR LENS  IMPLANT, BILATERAL  2011   per Dr. Ayesha Rumpf in [place]  . CHOLECYSTECTOMY  01/2003  . colonscopy  08-03-13   per Dr. Hilarie Fredrickson, clear, no repeats needed   . dexa  01/2009   normal  . DILATION AND CURETTAGE OF UTERUS  2000's  . ESOPHAGOGASTRODUODENOSCOPY    . LUMBAR LAMINECTOMY/DECOMPRESSION MICRODISCECTOMY Right 10/09/2015   Procedure: Right Lumbar Five-Sacral One Extraforaminal Foraminotomy with removal of synovial cyst;  Surgeon: Eustace Moore, MD;  Location: [facility];  Service: Neurosurgery;  Laterality: Right;  . LUMBAR PERCUTANEOUS PEDICLE SCREW 1 LEVEL  06/08/2012   Procedure: LUMBAR PERCUTANEOUS PEDICLE SCREW 1 LEVEL;   Surgeon: Eustace Moore, MD;  Location: [facility];  Service: Neurosurgery;  Laterality: Right;  Right Lumbar two-three, lumbar three-four extreme lateral interbody fusion with percutaneous pedicle screws  . LUNG LOBECTOMY  08/1997   RLL "for carcinoid" (06/08/2012)  . TONSILLECTOMY  1947  . TOTAL HIP ARTHROPLASTY Right 01/28/2016   Procedure: RIGHT TOTAL HIP ARTHROPLASTY ANTERIOR APPROACH;  Surgeon: Renette Butters, MD;  Location: [facility];  Service: Orthopedics;  Laterality: Right;  . TUBAL LIGATION  ~ 1976   Family History  Problem Relation Age of Onset  . Multiple sclerosis Brother   . Asthma Other   . Coronary artery disease Other   . Diabetes Other   . Hypertension Other   . Melanoma Other   .  Osteoporosis Other   . Uterine cancer Other   . Kidney cancer Other   . Colon cancer Neg Hx    Social History   Socioeconomic History  . Marital status: Married    Spouse name: Not on file  . Number of children: Not on file  . Years of education: Not on file  . Highest education level: Not on file  Occupational History  . Not on file  Tobacco Use  . Smoking status: Never Smoker  . Smokeless tobacco: Never Used  Substance and Sexual Activity  . Alcohol use: No    Alcohol/week: 0.0 standard drinks  . Drug use: No  . Sexual activity: Yes  Other Topics Concern  . Not on file  Social History Narrative   Married         Social Determinants of Health   Financial Resource Strain: Low Risk   . Difficulty of Paying Living Expenses: Not hard at all  Food Insecurity: No Food Insecurity  . Worried About Charity fundraiser in the Last Year: Never true  . Ran Out of Food in the Last Year: Never true  Transportation Needs: No Transportation Needs  . Lack of Transportation (Medical): No  . Lack of Transportation (Non-Medical): No  Physical Activity: Inactive  . Days of Exercise per Week: 0 days  . Minutes of Exercise per Session: 0 min  Stress: No Stress Concern Present  . Feeling  of Stress : Not at all  Social Connections: Socially Integrated  . Frequency of Communication with Friends and Family: More than three times a week  . Frequency of Social Gatherings with Friends and Family: More than three times a week  . Attends Religious Services: 1 to 4 times per year  . Active Member of Clubs or Organizations: Yes  . Attends Archivist Meetings: 1 to 4 times per year  . Marital Status: Married    Tobacco Counseling Counseling given: Not Answered   Clinical Intake:  Pre-visit preparation completed: Yes  Pain : No/denies pain     Nutritional Risks: None Diabetes: No  How often do you need to have someone help you when you read instructions, pamphlets, or other written materials from your doctor or pharmacy?: 1 - Never What is the last grade level you completed in school?: college  Diabetic?no  Interpreter Needed?: No  Information entered by :: L.Lillie Portner, LPN   Activities of Daily Living In your present state of health, do you have any difficulty performing the following activities: 10/21/2020  Hearing? N  Vision? N  Difficulty concentrating or making decisions? N  Walking or climbing stairs? N  Dressing or bathing? N  Doing errands, shopping? N  Preparing Food and eating ? N  Using the Toilet? N  In the past six months, have you accidently leaked urine? N  Do you have problems with loss of bowel control? N  Managing your Medications? N  Managing your Finances? N  Housekeeping or managing your Housekeeping? N  Some recent data might be hidden    Patient Care Team: Laurey Morale, MD as PCP - General  Indicate any recent Medical Services you may have received from other than [facility] providers in the past year (date may be approximate).     Assessment:   This is a routine wellness examination for Meg.  Hearing/Vision screen  Hearing Screening   125Hz  250Hz  500Hz  1000Hz  2000Hz  3000Hz  4000Hz  6000Hz  8000Hz   Right ear:  Left ear:           Vision Screening Comments: Annual eye exam   Dietary issues and exercise activities discussed: Current Exercise Habits: The patient does not participate in regular exercise at present, Exercise limited by: None identified  Goals Addressed   None    Depression Screen PHQ 2/9 Scores 10/21/2020 04/08/2020 03/31/2019 01/20/2018 10/22/2016 07/04/2015 05/25/2014  PHQ - 2 Score 0 0 0 0 0 0 0  PHQ- 9 Score - 0 - - - - -  Exception Documentation - Medical reason - - - - -    Fall Risk Fall Risk  10/21/2020 04/08/2020 03/31/2019 01/20/2018 10/22/2016  Falls in the past year? 0 1 0 Yes No  Number falls in past yr: 0 0 0 2 or more -  Injury with Fall? 0 0 0 No -  Risk for fall due to : - - - - -  Follow up - - Falls evaluation completed - -    FALL RISK PREVENTION PERTAINING TO THE HOME:  Any stairs in or around the home? No  If so, are there any without handrails? Yes  Home free of loose throw rugs in walkways, pet beds, electrical cords, etc? Yes  Adequate lighting in your home to reduce risk of falls? Yes   ASSISTIVE DEVICES UTILIZED TO PREVENT FALLS:  Life alert? No  Use of a cane, walker or w/c? No  Grab bars in the bathroom? Yes  Shower chair or bench in shower? Yes  Elevated toilet seat or a handicapped toilet? Yes   TIMED UP AND GO:  Was the test performed? Yes .  Length of time to ambulate 10 feet: 6 sec.   Gait steady and fast without use of assistive device  Cognitive Function:     Normal cognitive status assessed by direct observation by this Nurse Health Advisor. No abnormalities found.      Immunizations Immunization History  Administered Date(s) Administered  . Fluad Quad(high Dose 65+) 03/31/2019, 04/08/2020  . Influenza Split 03/23/2011, 04/18/2012  . Influenza Whole 04/22/2006, 03/25/2007, 03/23/2008, 04/03/2009, 04/28/2010  . Influenza, High Dose Seasonal PF 02/27/2015, 03/31/2016, 04/08/2017, 04/04/2018  . Influenza,inj,Quad PF,6+ Mos  05/11/2013, 03/05/2014  . Pneumococcal Conjugate-13 05/25/2014  . Pneumococcal Polysaccharide-23 04/22/2001, 04/18/2012  . Td 04/28/2010    TDAP status: Due, Education has been provided regarding the importance of this vaccine. Advised may receive this vaccine at local pharmacy or Health Dept. Aware to provide a copy of the vaccination record if obtained from local pharmacy or Health Dept. Verbalized acceptance and understanding.  Flu Vaccine status: Up to date  Pneumococcal vaccine status: Up to date  Covid-19 vaccine status: Completed vaccines  Qualifies for Shingles Vaccine? Yes   Zostavax completed No   Shingrix Completed?: No.    Education has been provided regarding the importance of this vaccine. Patient has been advised to call insurance company to determine out of pocket expense if they have not yet received this vaccine. Advised may also receive vaccine at local pharmacy or Health Dept. Verbalized acceptance and understanding.  Screening Tests Health Maintenance  Topic Date Due  . Hepatitis C Screening  Never done  . COVID-19 Vaccine (1) Never done  . TETANUS/TDAP  04/28/2020  . INFLUENZA VACCINE  01/20/2021  . DEXA SCAN  Completed  . PNA vac Low Risk Adult  Completed  . HPV VACCINES  Aged Out    Health Maintenance  Health Maintenance Due  Topic Date Due  . Hepatitis C  Screening  Never done  . COVID-19 Vaccine (1) Never done  . TETANUS/TDAP  04/28/2020    Colorectal cancer screening: No longer required.   Mammogram status: No longer required due to age.  Bone Density status: Completed 08/15/2015. Results reflect: Bone density results: OSTEOPENIA. Repeat every 5 years.  Lung Cancer Screening: (Low Dose CT Chest recommended if Age 80-80 years, 30 pack-year currently smoking OR have quit w/in 15years.) does not qualify.   Lung Cancer Screening Referral: n/a  Additional Screening:  Hepatitis C Screening: does qualify  Vision Screening: Recommended annual  ophthalmology exams for early detection of glaucoma and other disorders of the eye. Is the patient up to date with their annual eye exam?  Yes  Who is the provider or what is the name of the office in which the patient attends annual eye exams? Dr. Humberto Seals If pt is not established with a provider, would they like to be referred to a provider to establish care? No .   Dental Screening: Recommended annual dental exams for proper oral hygiene  Community Resource Referral / Chronic Care Management: CRR required this visit?  No   CCM required this visit?  No      Plan:     I have personally reviewed and noted the following in the patient's chart:   . Medical and social history . Use of alcohol, tobacco or illicit drugs  . Current medications and supplements including opioid prescriptions. Patient is not currently taking opioid prescriptions. . Functional ability and status . Nutritional status . Physical activity . Advanced directives . List of other physicians . Hospitalizations, surgeries, and ER visits in previous 12 months . Vitals . Screenings to include cognitive, depression, and falls . Referrals and appointments  In addition, I have reviewed and discussed with patient certain preventive protocols, quality metrics, and best practice recommendations. A written personalized care plan for preventive services as well as general preventive health recommendations were provided to patient.     Randel Pigg, LPN   07/24/252   Nurse Notes: none

## 2020-10-25 ENCOUNTER — Encounter: Payer: Self-pay | Admitting: Family Medicine

## 2020-11-04 ENCOUNTER — Other Ambulatory Visit: Payer: Self-pay

## 2020-11-04 ENCOUNTER — Ambulatory Visit (INDEPENDENT_AMBULATORY_CARE_PROVIDER_SITE_OTHER)
Admission: RE | Admit: 2020-11-04 | Discharge: 2020-11-04 | Disposition: A | Discharge status: Home / Self Care | Payer: Medicare Other | Source: Ambulatory Visit | Attending: Family Medicine | Admitting: Family Medicine

## 2020-11-04 DIAGNOSIS — Z780 Asymptomatic menopausal state: Secondary | ICD-10-CM

## 2020-11-14 ENCOUNTER — Telehealth: Payer: Self-pay | Admitting: Family Medicine

## 2020-11-14 NOTE — Telephone Encounter (Signed)
Patient informed of the results

## 2020-11-14 NOTE — Telephone Encounter (Signed)
Patient is calling and is requesting a call back regarding her bone density test, please advise. CB is [phone]

## 2020-12-23 ENCOUNTER — Other Ambulatory Visit: Payer: Self-pay | Admitting: Family Medicine

## 2020-12-27 ENCOUNTER — Telehealth: Payer: Medicare Other | Admitting: Physician Assistant

## 2020-12-27 ENCOUNTER — Encounter: Payer: Self-pay | Admitting: Physician Assistant

## 2020-12-27 DIAGNOSIS — Z20822 Contact with and (suspected) exposure to covid-19: Secondary | ICD-10-CM | POA: Diagnosis not present

## 2020-12-27 DIAGNOSIS — U071 COVID-19: Secondary | ICD-10-CM

## 2020-12-27 MED ORDER — MOLNUPIRAVIR EUA 200MG CAPSULE: 4.0000 | ORAL_CAPSULE | Freq: Two times a day (BID) | ORAL | 0 refills | Status: AC

## 2020-12-27 NOTE — Patient Instructions (Signed)
Hello Karen "Jeannie",  You are being placed in the home monitoring program for COVID-19 (commonly known as Coronavirus).  This is because you are suspected to have the virus or are known to have the virus.  If you are unsure which group you fall into call your clinic.    As part of this program, you'll answer a daily questionnaire in the MyChart mobile app. You'll receive a notification through the MyChart app when the questionnaire is available. When you log in to MyChart, you'll see the tasks in your To Do activity.       Clinicians will see any answers that are concerning and take appropriate steps.  If at any point you are having a medical emergency, call 911.  If otherwise concerned call your clinic instead of coming into the clinic or hospital.  To keep from spreading the disease you should: Stay home and limit contact with other people as much as possible.  Wash your hands frequently. Cover your coughs and sneezes with a tissue, and throw used tissues in the trash.   Clean and disinfect frequently touched surfaces and objects.    Take care of yourself by: Staying home Resting Drinking fluids Take fever-reducing medications (Tylenol/Acetaminophen and Ibuprofen)  For more information on the disease go to the Centers for Disease Control and Prevention website     You are being prescribed MOLNUPIRAVIR for COVID-19 infection.   Please call the pharmacy or go through the drive through vs going inside if you are picking up the mediation yourself to prevent further spread. If prescribed to a [facility] affiliated pharmacy, a pharmacist will bring the medication out to your car.   ADMINISTRATION INSTRUCTIONS: Take with or without food. Swallow the tablets whole. Don't chew, crush, or break the medications because it might not work as well  For each dose of the medication, you should be taking FOUR tablets at one time, TWICE a day   Finish your full five-day course of Molnupiravir  even if you feel better before you're done. Stopping this medication too early can make it less effective to prevent severe illness related to [place].    Molnupiravir is prescribed for YOU ONLY. Don't share it with others, even if they have similar symptoms as you. This medication might not be right for everyone.   Make sure to take steps to protect yourself and others while you're taking this medication in order to get well soon and to prevent others from getting sick with COVID-19.   **If you are of childbearing potential (any gender) - it is advised to not get pregnant while taking this medication and recommended that condoms are used for female partners the next 3 months after taking the medication out of extreme caution    COMMON SIDE EFFECTS: Diarrhea Nausea  Dizziness    If your COVID-19 symptoms get worse, get medical help right away. Call 911 if you experience symptoms such as worsening cough, trouble breathing, chest pain that doesn't go away, confusion, a hard time staying awake, and pale or blue-colored skin. This medication won't prevent all COVID-19 cases from getting worse.   Can take to lessen severity: Vit C 500mg  twice daily Quercertin 250-500mg  twice daily Zinc 75-100mg  daily Melatonin 3-6 mg at bedtime Vit D3 1000-2000 IU daily Aspirin 81 mg daily with food Optional: Famotidine 20mg  daily Also can add tylenol/ibuprofen as needed for fevers and body aches May add Mucinex or Mucinex DM as needed for cough/congestion  10 Things You Can  Do to Manage Your COVID-19 Symptoms at Home If you have possible or confirmed COVID-19 Stay home except to get medical care. Monitor your symptoms carefully. If your symptoms get worse, call your healthcare provider immediately. Get rest and stay hydrated. If you have a medical appointment, call the healthcare provider ahead of time and tell them that you have or may have COVID-19. For medical emergencies, call 911 and notify the  dispatch personnel that you have or may have COVID-19. Cover your cough and sneezes with a tissue or use the inside of your elbow. Wash your hands often with soap and water for at least 20 seconds or clean your hands with an alcohol-based hand sanitizer that contains at least 60% alcohol. As much as possible, stay in a specific room and away from other people in your home. Also, you should use a separate bathroom, if available. If you need to be around other people in or outside of the home, wear a mask. Avoid sharing personal items with other people in your household, like dishes, towels, and bedding. Clean all surfaces that are touched often, like counters, tabletops, and doorknobs. Use household cleaning sprays or wipes according to the label instructions. [email or web address] 01/05/2020 This information is not intended to replace advice given to you by your health care provider. Make sure you discuss any questions you have with your healthcare provider. Document Revised: 07/26/2020 Document Reviewed: 07/26/2020 Elsevier Patient Education  [place].

## 2020-12-27 NOTE — Progress Notes (Signed)
Karen Watson, Karen Watson are scheduled for a virtual visit with your provider today.    Just as we do with appointments in the office, we must obtain your consent to participate.  Your consent will be active for this visit and any virtual visit you may have with one of our providers in the next 365 days.    If you have a MyChart account, I can also send a copy of this consent to you electronically.  All virtual visits are billed to your insurance company just like a traditional visit in the office.  As this is a virtual visit, video technology does not allow for your provider to perform a traditional examination.  This may limit your provider's ability to fully assess your condition.  If your provider identifies any concerns that need to be evaluated in person or the need to arrange testing such as labs, EKG, etc, we will make arrangements to do so.    Although advances in technology are sophisticated, we cannot ensure that it will always work on either your end or our end.  If the connection with a video visit is poor, we may have to switch to a telephone visit.  With either a video or telephone visit, we are not always able to ensure that we have a secure connection.   I need to obtain your verbal consent now.   Are you willing to proceed with your visit today?   Karen Watson has provided verbal consent on 12/27/2020 for a virtual visit (video or telephone).   Mar Daring, PA-C 12/27/2020  5:10 PM  Virtual Visit Consent   Karen Watson, you are scheduled for a virtual visit with a [facility] provider today.     Just as with appointments in the office, your consent must be obtained to participate.  Your consent will be active for this visit and any virtual visit you may have with one of our providers in the next 365 days.     If you have a MyChart account, a copy of this consent can be sent to you electronically.  All virtual visits are billed to your insurance company just like a traditional  visit in the office.    As this is a virtual visit, video technology does not allow for your provider to perform a traditional examination.  This may limit your provider's ability to fully assess your condition.  If your provider identifies any concerns that need to be evaluated in person or the need to arrange testing (such as labs, EKG, etc.), we will make arrangements to do so.     Although advances in technology are sophisticated, we cannot ensure that it will always work on either your end or our end.  If the connection with a video visit is poor, the visit may have to be switched to a telephone visit.  With either a video or telephone visit, we are not always able to ensure that we have a secure connection.     I need to obtain your verbal consent now.   Are you willing to proceed with your visit today?    Karen Watson has provided verbal consent on 12/27/2020 for a virtual visit (video or telephone).   Mar Daring, PA-C   Date: 12/27/2020 5:10 PM   Virtual Visit via Video Note   I, Mar Daring, connected with  Karen Watson  ([id number], 12-23-1940) on 12/27/20 at  4:45 PM EDT by a video-enabled telemedicine application  and verified that I am speaking with the correct person using two identifiers.  Location: Patient: Virtual Visit Location Patient: Home Provider: Virtual Visit Location Provider: Home Office   I discussed the limitations of evaluation and management by telemedicine and the availability of in person appointments. The patient expressed understanding and agreed to proceed.    History of Present Illness: Karen Watson is a 80 y.o. who identifies as a Karen who was assigned Karen at birth, and is being seen today for covid 19, tested positive today, 12/27/20.  HPI: URI  This is a new problem. The current episode started yesterday. The problem has been unchanged. Associated symptoms include congestion, coughing, diarrhea, headaches, sinus pain and a sore  throat (scratchy and sore). Pertinent negatives include no nausea or rhinorrhea. She has tried acetaminophen (ricola dual action cough drops) for the symptoms.     Problems:  Patient Active Problem List   Diagnosis Date Noted   Gastroesophageal reflux disease 08/19/2020   COVID-19 virus infection 08/19/2020   Depression with anxiety 01/21/2018   Chronic venous insufficiency 04/29/2016   Insomnia 02/12/2016   Primary localized osteoarthritis of right hip 01/28/2016   Primary osteoarthritis of right hip 01/07/2016   S/P lumbar laminectomy 10/09/2015   Osteoporosis 07/04/2015   Arthropathy 04/30/2008   NECK PAIN 10/21/2007   Osteoarthritis 06/22/2007   LEG CRAMPS 06/22/2007   Essential hypertension 03/03/2007   LOW BACK PAIN 03/03/2007   HEADACHE 03/03/2007    Allergies:  Allergies  Allergen Reactions   Carbocaine [Mepivacaine Hcl] Hives and Nausea And Vomiting   Codeine Anaphylaxis, Shortness Of Breath, Nausea And Vomiting and Other (See Comments)    Respiratory distress; Low blood pressure. Pt tolerates percocet.   Hydrocodone Bit-Homatrop Mbr Anaphylaxis, Nausea And Vomiting and Shortness Of Breath    Dangerously low blood pressure, respiratory distress; heaviness in chest   Metrizamide Hives    Pre-medicated with Benadryl 50mg  PO   Propoxyphene N-Acetaminophen Anaphylaxis and Nausea And Vomiting    "Darvocet" t; Lightheadedness, confusion   Robaxin [Methocarbamol] Other (See Comments)    "severe Altzheimer's symptoms"   Tape Rash and Other (See Comments)    Surgical tape causes rash and big blisters   Tramadol Other (See Comments)    Low respiratory rate, nausea, chest heaviness   Vicodin [Hydrocodone-Acetaminophen] Anaphylaxis   Cefazolin Hives, Nausea And Vomiting and Other (See Comments)    "Ancef"; Also, severe stomach pain   Iodinated Diagnostic Agents Hives    Pre-medicated with Benadryl 50mg  PO   Penicillins Hives, Nausea And Vomiting and Other (See Comments)     severe stomach pain, Has patient had a PCN reaction causing immediate rash, facial/tongue/throat swelling, SOB or lightheadedness with hypotension: Yes Has patient had a PCN reaction causing severe rash involving mucus membranes or skin necrosis: No Has patient had a PCN reaction that required hospitalization No Has patient had a PCN reaction occurring within the last 10 years: Yes If all of the above answers are "NO", then may proceed with Cephalosporin use.    Amoxil [Amoxicillin]     Itching and rash over body, N & V, itchy scalp    Nsaids Other (See Comments)    Bruising; use with caution   Medications:  Current Outpatient Medications:    molnupiravir EUA 200 mg CAPS, Take 4 capsules (800 mg total) by mouth 2 (two) times daily for 5 days., Disp: 40 capsule, Rfl: 0   acetaminophen (TYLENOL) 500 MG tablet, Take 1,000 mg by mouth 3 (  three) times daily as needed for mild pain. , Disp: , Rfl:    amLODipine (NORVASC) 5 MG tablet, TAKE 1 TABLET BY MOUTH  DAILY, Disp: 90 tablet, Rfl: 3   b complex vitamins tablet, Take 1 tablet by mouth daily., Disp: , Rfl:    Calcium Carb-Cholecalciferol (CALCIUM 600 + D PO), Take 600 mg by mouth 2 (two) times daily., Disp: , Rfl:    fish oil-omega-3 fatty acids 1000 MG capsule, Take 1 g by mouth daily., Disp: , Rfl:    gabapentin (NEURONTIN) 300 MG capsule, TAKE 1 CAPSULE BY MOUTH 3  TIMES DAILY, Disp: 270 capsule, Rfl: 3   MAGNESIUM-ZINC PO, Take 1 tablet by mouth every other day. , Disp: , Rfl:    meclizine (ANTIVERT) 25 MG tablet, Take 1 tablet (25 mg total) by mouth every 4 (four) hours as needed for dizziness., Disp: 60 tablet, Rfl: 5   Melatonin 10 MG CAPS, Take 10 mg by mouth., Disp: , Rfl:    MULTIPLE VITAMIN PO, Take 1 tablet by mouth daily., Disp: , Rfl:    pantoprazole (PROTONIX) 20 MG tablet, Take 2 tablets (40 mg total) by mouth daily for 14 days., Disp: 28 tablet, Rfl: 0   potassium chloride SA (KLOR-CON) 20 MEQ tablet, TAKE 1 TABLET BY MOUTH   TWICE DAILY, Disp: 180 tablet, Rfl: 1   triamcinolone cream (KENALOG) 0.1 %, Apply 1 application topically 2 (two) times daily., Disp: 45 g, Rfl: 1   triamterene-hydrochlorothiazide (MAXZIDE-25) 37.5-25 MG tablet, TAKE 1 TABLET BY MOUTH  DAILY, Disp: 90 tablet, Rfl: 1   urea (CARMOL) 20 % cream, Apply topically as needed., Disp: 85 g, Rfl: 0   vitamin C (ASCORBIC ACID) 500 MG tablet, Take 500 mg by mouth daily as needed (in winter months)., Disp: , Rfl:    vitamin E 400 UNIT capsule, Take 400 Units by mouth daily., Disp: , Rfl:   Observations/Objective: Patient is well-developed, well-nourished in no acute distress.  Resting comfortably at home.  Head is normocephalic, atraumatic.  No labored breathing. Speech is clear and coherent with logical content.  Patient is alert and oriented at baseline.   Assessment and Plan: 1. COVID-19 - MyChart COVID-19 home monitoring program; Future - molnupiravir EUA 200 mg CAPS; Take 4 capsules (800 mg total) by mouth 2 (two) times daily for 5 days.  Dispense: 40 capsule; Refill: 0 - Continue OTC symptomatic management of choice - Will send OTC vitamins and supplement information through AVS - Molnupiravir prescribed for patient as above - Patient enrolled in MyChart symptom monitoring - Push fluids - Rest as needed - Discussed return precautions and when to seek in-person evaluation, sent via AVS as well  Follow Up Instructions: I discussed the assessment and treatment plan with the patient. The patient was provided an opportunity to ask questions and all were answered. The patient agreed with the plan and demonstrated an understanding of the instructions.  A copy of instructions were sent to the patient via MyChart.  The patient was advised to call back or seek an in-person evaluation if the symptoms worsen or if the condition fails to improve as anticipated.  Time:  I spent 16 minutes with the patient via telehealth technology discussing the above  problems/concerns.    Mar Daring, PA-C

## 2020-12-30 ENCOUNTER — Telehealth: Payer: Self-pay

## 2020-12-30 NOTE — Telephone Encounter (Signed)
Patient called, left VM to return the call to speak to a TN about her worsening symptoms reported on [facility] [phone]) or call her PCP office to discuss/follow up.     This patient reported symptoms of new or worsening diarrhea, appetite, cough, or weakness in MyChart today.

## 2021-01-05 ENCOUNTER — Encounter: Payer: Self-pay | Admitting: Family Medicine

## 2021-01-06 ENCOUNTER — Other Ambulatory Visit: Payer: Self-pay

## 2021-01-06 MED ORDER — SULFAMETHOXAZOLE-TRIMETHOPRIM 800-160 MG PO TABS: 1.0000 | ORAL_TABLET | Freq: Two times a day (BID) | ORAL | 0 refills | Status: DC

## 2021-01-06 NOTE — Telephone Encounter (Signed)
No need for a sample, please call in Bactrim DS BID for 7 days

## 2021-02-18 DIAGNOSIS — Z85828 Personal history of other malignant neoplasm of skin: Secondary | ICD-10-CM | POA: Diagnosis not present

## 2021-02-18 DIAGNOSIS — L820 Inflamed seborrheic keratosis: Secondary | ICD-10-CM | POA: Diagnosis not present

## 2021-02-18 DIAGNOSIS — L570 Actinic keratosis: Secondary | ICD-10-CM | POA: Diagnosis not present

## 2021-02-18 DIAGNOSIS — L821 Other seborrheic keratosis: Secondary | ICD-10-CM | POA: Diagnosis not present

## 2021-02-18 DIAGNOSIS — Z808 Family history of malignant neoplasm of other organs or systems: Secondary | ICD-10-CM | POA: Diagnosis not present

## 2021-03-03 ENCOUNTER — Other Ambulatory Visit: Payer: Self-pay

## 2021-03-03 ENCOUNTER — Ambulatory Visit (INDEPENDENT_AMBULATORY_CARE_PROVIDER_SITE_OTHER): Payer: Medicare Other | Admitting: Family Medicine

## 2021-03-03 ENCOUNTER — Encounter: Payer: Self-pay | Admitting: Family Medicine

## 2021-03-03 VITALS — BP 122/78 | HR 72 | Temp 98.6°F | Wt 160.0 lb

## 2021-03-03 DIAGNOSIS — H6123 Impacted cerumen, bilateral: Secondary | ICD-10-CM | POA: Diagnosis not present

## 2021-03-03 NOTE — Progress Notes (Signed)
   Subjective:    Patient ID: Karen Watson, female    DOB: [birth year], 80 y.o.   MRN: [id number]  HPI She has noticed some trouble hearing out of both ears for a few weeks, and for the past 5 days she has had some pain in the left ear. No fever or sinus pain or ST.    Review of Systems  Constitutional: Negative.   HENT:  Positive for ear pain and hearing loss. Negative for congestion, ear discharge, sinus pressure and sore throat.   Eyes: Negative.   Respiratory: Negative.        Objective:   Physical Exam Constitutional:      Appearance: Normal appearance.  HENT:     Right Ear: There is impacted cerumen.     Left Ear: There is impacted cerumen.     Nose: Nose normal.     Mouth/Throat:     Pharynx: Oropharynx is clear.  Eyes:     Conjunctiva/sclera: Conjunctivae normal.  Cardiovascular:     Rate and Rhythm: Normal rate and regular rhythm.     Pulses: Normal pulses.     Heart sounds: Normal heart sounds.  Pulmonary:     Effort: Pulmonary effort is normal.     Breath sounds: Normal breath sounds.  Lymphadenopathy:     Cervical: No cervical adenopathy.  Neurological:     Mental Status: She is alert.          Assessment & Plan:  Cerumen impactions. After informed consent was obtained we irrigated both ear canals with water. She tolerated this well. Afterwards the ear canals were clear, and she could hear normally.  Alysia Penna, MD

## 2021-03-18 ENCOUNTER — Other Ambulatory Visit: Payer: Self-pay | Admitting: Family Medicine

## 2021-03-18 DIAGNOSIS — I10 Essential (primary) hypertension: Secondary | ICD-10-CM

## 2021-04-08 ENCOUNTER — Telehealth: Payer: Self-pay | Admitting: *Deleted

## 2021-04-08 NOTE — Chronic Care Management (AMB) (Signed)
  Chronic Care Management   Note  04/08/2021 Name: [name] MRN: [id number] DOB: [birth year]  Karen Watson is a 80 y.o. year old female who is a primary care patient of Laurey Morale, MD. I reached out to [name] by phone today in response to a referral sent by Karen Watson's PCP.  Ms. Tiller was given information about Chronic Care Management services today including:  CCM service includes personalized support from designated clinical staff supervised by her physician, including individualized plan of care and coordination with other care providers 24/7 contact phone numbers for assistance for urgent and routine care needs. Service will only be billed when office clinical staff spend 20 minutes or more in a month to coordinate care. Only one practitioner may furnish and bill the service in a calendar month. The patient may stop CCM services at any time (effective at the end of the month) by phone call to the office staff. The patient is responsible for co-pay (up to 20% after annual deductible is met) if co-pay is required by the individual health plan.   Patient agreed to services and verbal consent obtained.   Follow up plan: Telephone appointment with care management team member scheduled for:04/22/21  [facility]  Direct Dial: [phone]

## 2021-04-14 ENCOUNTER — Telehealth: Payer: Medicare Other

## 2021-04-22 ENCOUNTER — Ambulatory Visit (INDEPENDENT_AMBULATORY_CARE_PROVIDER_SITE_OTHER): Payer: Medicare Other

## 2021-04-22 ENCOUNTER — Telehealth: Payer: Medicare Other

## 2021-04-22 DIAGNOSIS — G8929 Other chronic pain: Secondary | ICD-10-CM

## 2021-04-22 DIAGNOSIS — I10 Essential (primary) hypertension: Secondary | ICD-10-CM

## 2021-04-22 DIAGNOSIS — M159 Polyosteoarthritis, unspecified: Secondary | ICD-10-CM

## 2021-04-22 NOTE — Chronic Care Management (AMB) (Signed)
Chronic Care Management   CCM RN Visit Note  04/22/2021 Name: Karen Watson MRN: [id number] DOB: Dec 08, 1940  Subjective: Karen Watson is a 80 y.o. year old female who is a primary care patient of Laurey Morale, MD. The care management team was consulted for assistance with disease management and care coordination needs.    Engaged with patient by telephone for initial visit in response to provider referral for case management and/or care coordination services.   Consent to Services:  The patient was given the following information about Chronic Care Management services today, agreed to services, and gave verbal consent: 1. CCM service includes personalized support from designated clinical staff supervised by the primary care provider, including individualized plan of care and coordination with other care providers 2. 24/7 contact phone numbers for assistance for urgent and routine care needs. 3. Service will only be billed when office clinical staff spend 20 minutes or more in a month to coordinate care. 4. Only one practitioner may furnish and bill the service in a calendar month. 5.The patient may stop CCM services at any time (effective at the end of the month) by phone call to the office staff. 6. The patient will be responsible for cost sharing (co-pay) of up to 20% of the service fee (after annual deductible is met). Patient agreed to services and consent obtained.  Patient agreed to services and verbal consent obtained.   Assessment: Review of patient past medical history, allergies, medications, health status, including review of consultants reports, laboratory and other test data, was performed as part of comprehensive evaluation and provision of chronic care management services.   SDOH (Social Determinants of Health) assessments and interventions performed:  SDOH Interventions    Flowsheet Row Most Recent Value  SDOH Interventions   Food Insecurity Interventions Intervention Not  Indicated  Financial Strain Interventions Intervention Not Indicated  Housing Interventions Intervention Not Indicated  Physical Activity Interventions Intervention Not Indicated  Stress Interventions Intervention Not Indicated  Transportation Interventions Intervention Not Indicated        CCM Care Plan  Allergies  Allergen Reactions   Carbocaine [Mepivacaine Hcl] Hives and Nausea And Vomiting   Codeine Anaphylaxis, Shortness Of Breath, Nausea And Vomiting and Other (See Comments)    Respiratory distress; Low blood pressure. Pt tolerates percocet.   Hydrocodone Bit-Homatrop Mbr Anaphylaxis, Nausea And Vomiting and Shortness Of Breath    Dangerously low blood pressure, respiratory distress; heaviness in chest   Metrizamide Hives    Pre-medicated with Benadryl 23m PO   Propoxyphene N-Acetaminophen Anaphylaxis and Nausea And Vomiting    "Darvocet" t; Lightheadedness, confusion   Robaxin [Methocarbamol] Other (See Comments)    "severe Altzheimer's symptoms"   Tape Rash and Other (See Comments)    Surgical tape causes rash and big blisters   Tramadol Other (See Comments)    Low respiratory rate, nausea, chest heaviness   Vicodin [Hydrocodone-Acetaminophen] Anaphylaxis   Cefazolin Hives, Nausea And Vomiting and Other (See Comments)    "Ancef"; Also, severe stomach pain   Iodinated Diagnostic Agents Hives    Pre-medicated with Benadryl 549mPO   Penicillins Hives, Nausea And Vomiting and Other (See Comments)    severe stomach pain, Has patient had a PCN reaction causing immediate rash, facial/tongue/throat swelling, SOB or lightheadedness with hypotension: Yes Has patient had a PCN reaction causing severe rash involving mucus membranes or skin necrosis: No Has patient had a PCN reaction that required hospitalization No Has patient had a PCN reaction occurring  within the last 10 years: Yes If all of the above answers are "NO", then may proceed with Cephalosporin use.    Amoxil  [Amoxicillin]     Itching and rash over body, N & V, itchy scalp    Nsaids Other (See Comments)    Bruising; use with caution    Outpatient Encounter Medications as of 04/22/2021  Medication Sig   acetaminophen (TYLENOL) 500 MG tablet Take 1,000 mg by mouth 3 (three) times daily as needed for mild pain.    amLODipine (NORVASC) 5 MG tablet TAKE 1 TABLET BY MOUTH  DAILY   b complex vitamins tablet Take 1 tablet by mouth daily.   Calcium Carb-Cholecalciferol (CALCIUM 600 + D PO) Take 600 mg by mouth 2 (two) times daily.   fish oil-omega-3 fatty acids 1000 MG capsule Take 1 g by mouth daily.   gabapentin (NEURONTIN) 300 MG capsule TAKE 1 CAPSULE BY MOUTH 3  TIMES DAILY   MAGNESIUM-ZINC PO Take 1 tablet by mouth every other day.    meclizine (ANTIVERT) 25 MG tablet Take 1 tablet (25 mg total) by mouth every 4 (four) hours as needed for dizziness.   Melatonin 10 MG CAPS Take 10 mg by mouth.   MULTIPLE VITAMIN PO Take 1 tablet by mouth daily.   pantoprazole (PROTONIX) 20 MG tablet Take 2 tablets (40 mg total) by mouth daily for 14 days.   potassium chloride SA (KLOR-CON) 20 MEQ tablet TAKE 1 TABLET BY MOUTH  TWICE DAILY   triamcinolone cream (KENALOG) 0.1 % Apply 1 application topically 2 (two) times daily.   triamterene-hydrochlorothiazide (MAXZIDE-25) 37.5-25 MG tablet TAKE 1 TABLET BY MOUTH  DAILY   urea (CARMOL) 20 % cream Apply topically as needed.   vitamin C (ASCORBIC ACID) 500 MG tablet Take 500 mg by mouth daily as needed (in winter months).   vitamin E 400 UNIT capsule Take 400 Units by mouth daily.   No facility-administered encounter medications on file as of 04/22/2021.    Patient Active Problem List   Diagnosis Date Noted   Gastroesophageal reflux disease 08/19/2020   COVID-19 virus infection 08/19/2020   Depression with anxiety 01/21/2018   Chronic venous insufficiency 04/29/2016   Insomnia 02/12/2016   Primary localized osteoarthritis of right hip 01/28/2016   Primary  osteoarthritis of right hip 01/07/2016   S/P lumbar laminectomy 10/09/2015   Osteoporosis 07/04/2015   Arthropathy 04/30/2008   NECK PAIN 10/21/2007   Osteoarthritis 06/22/2007   LEG CRAMPS 06/22/2007   Essential hypertension 03/03/2007   LOW BACK PAIN 03/03/2007   HEADACHE 03/03/2007    Conditions to be addressed/monitored:HTN, HLD, and Osteoarthritis  Care Plan : RN Care Manager Plan of Care  Updates made by Dimitri Ped, RN since 04/22/2021 12:00 AM     Problem: Chronic Disease Management and Care Coordination Needs (HTN,osteoarthritis and HLD)   Priority: High     Long-Range Goal: Establish Plan of Care for Chronic Disease Management Needs (HTN, Osteoarthris and HLD)   Start Date: 04/22/2021  Expected End Date: 10/19/2021  This Visit's Progress: On track  Priority: High  Note:   Current Barriers:  Chronic Disease Management support and education needs related to HTN, HLD, and Osteoarthritis Pt states that she does not check her B/P at home unless she feels bad.  States her lower back pain varies depending on her activity.  States she cares for her husband who has Alzheimer's.  States he is  not difficult to care for at this time.  Denies needing LCSW for caregiver stress.  States her children help if needed.  States she and her husband play golf 3 times a week for exercise.  States she did have frequent UTI's but that is improved with the help of cranberry extract and more fluids. States she tries to eat healthy and cooks at home most of the time  [facility] Clinical Goal(s):  Patient will verbalize understanding of plan for management of HTN, HLD, and Osteoarthritis verbalize basic understanding of  HTN, HLD, and Osteoarthritis disease process and self health management plan   take all medications exactly as prescribed and will call provider for medication related questions attend all scheduled medical appointments: Flu vaccination 04/30/21 demonstrate Improved adherence to  prescribed treatment plan for HTN, HLD, and Osteoarthritis as evidenced by readings within limits and adherence to plan of care continue to work with RN Care Manager to address care management and care coordination needs related to  HTN, HLD, and Osteoarthritis will demonstrate ongoing self health care management ability    through collaboration with RN Care manager, provider, and care team.   Interventions: 1:1 collaboration with primary care provider regarding development and update of comprehensive plan of care as evidenced by provider attestation and co-signature Inter-disciplinary care team collaboration (see longitudinal plan of care) Evaluation of current treatment plan related to  self management and patient's adherence to plan as established by provider  Pain Interventions: Pain assessment performed Medications reviewed Reviewed provider established plan for pain management; Discussed importance of adherence to all scheduled medical appointments; Counseled on the importance of reporting any/all new or changed pain symptoms or management strategies to pain management provider; Advised patient to report to care team affect of pain on daily activities; Discussed use of relaxation techniques and/or diversional activities to assist with pain reduction (distraction, imagery, relaxation, massage, acupressure, TENS, heat, and cold application; Reviewed with patient prescribed pharmacological and nonpharmacological pain relief strategies; Screening for signs and symptoms of depression related to chronic disease state;  Assessed social determinant of health barriers;   Hypertension Interventions: Last practice recorded BP readings:  BP Readings from Last 3 Encounters:  03/03/21 122/78  10/21/20 (!) 162/72  09/16/20 128/76  Most recent eGFR/CrCl: No results found for: EGFR  No components found for: CRCL  Evaluation of current treatment plan related to hypertension self management and  patient's adherence to plan as established by provider; Provided education to patient re: stroke prevention, s/s of heart attack and stroke; Reviewed medications with patient and discussed importance of compliance; Counseled on the importance of exercise goals with target of 150 minutes per week Discussed plans with patient for ongoing care management follow up and provided patient with direct contact information for care management team; Advised patient, providing education and rationale, to monitor blood pressure daily and record, calling PCP for findings outside established parameters;  Provided education on prescribed diet low sodium low fat;  Hyperlipidemia Interventions: Medication review performed; medication list updated in electronic medical record.  Provider established cholesterol goals reviewed; Counseled on importance of regular laboratory monitoring as prescribed; Reviewed importance of limiting foods high in cholesterol; Reviewed exercise goals and target of 150 minutes per week;  Patient Goals/Self-Care Activities: Patient will self administer medications as prescribed Patient will attend all scheduled provider appointments Patient will call pharmacy for medication refills Patient will attend church or other social activities Patient will call provider office for new concerns or questions check blood pressure weekly - choose a place to take my blood pressure (home,  clinic or office, retail store) - write blood pressure results in a log or diary  Follow Up Plan:  Telephone follow up appointment with care management team member scheduled for:  06/26/21 at 3: 30 PM The patient has been provided with contact information for the care management team and has been advised to call with any health related questions or concerns.       Plan:Telephone follow up appointment with care management team member scheduled for:  06/26/21 The patient has been provided with contact information  for the care management team and has been advised to call with any health related questions or concerns.  Peter Garter RN, Jackquline Denmark, CDE Care Management Coordinator [facility] [phone], Mobile [phone]

## 2021-04-22 NOTE — Patient Instructions (Addendum)
Visit Information  Hypertension, Adult Hypertension is another name for high blood pressure. High blood pressure forces your heart to work harder to pump blood. This can cause problems over time. There are two numbers in a blood pressure reading. There is a top number (systolic) over a bottom number (diastolic). It is best to have a blood pressure that is below 120/80. Healthy choices can help lower your blood pressure, or you may need medicine to help lower it. What are the causes? The cause of this condition is not known. Some conditions may be related to high blood pressure. What increases the risk? Smoking. Having type 2 diabetes mellitus, high cholesterol, or both. Not getting enough exercise or physical activity. Being overweight. Having too much fat, sugar, calories, or salt (sodium) in your diet. Drinking too much alcohol. Having long-term (chronic) kidney disease. Having a family history of high blood pressure. Age. Risk increases with age. Race. You may be at higher risk if you are African American. Gender. Men are at higher risk than women before age 18. After age 19, women are at higher risk than men. Having obstructive sleep apnea. Stress. What are the signs or symptoms? High blood pressure may not cause symptoms. Very high blood pressure (hypertensive crisis) may cause: Headache. Feelings of worry or nervousness (anxiety). Shortness of breath. Nosebleed. A feeling of being sick to your stomach (nausea). Throwing up (vomiting). Changes in how you see. Very bad chest pain. Seizures. How is this treated? This condition is treated by making healthy lifestyle changes, such as: Eating healthy foods. Exercising more. Drinking less alcohol. Your health care provider may prescribe medicine if lifestyle changes are not enough to get your blood pressure under control, and if: Your top number is above 130. Your bottom number is above 80. Your personal target blood pressure  may vary. Follow these instructions at home: Eating and drinking  If told, follow the DASH eating plan. To follow this plan: Fill one half of your plate at each meal with fruits and vegetables. Fill one fourth of your plate at each meal with whole grains. Whole grains include whole-wheat pasta, brown rice, and whole-grain bread. Eat or drink low-fat dairy products, such as skim milk or low-fat yogurt. Fill one fourth of your plate at each meal with low-fat (lean) proteins. Low-fat proteins include fish, chicken without skin, eggs, beans, and tofu. Avoid fatty meat, cured and processed meat, or chicken with skin. Avoid pre-made or processed food. Eat less than 1,500 mg of salt each day. Do not drink alcohol if: Your doctor tells you not to drink. You are pregnant, may be pregnant, or are planning to become pregnant. If you drink alcohol: Limit how much you use to: 0-1 drink a day for women. 0-2 drinks a day for men. Be aware of how much alcohol is in your drink. In the [place] one drink equals one 12 oz bottle of beer (355 mL), one 5 oz glass of wine (148 mL), or one 1 oz glass of hard liquor (44 mL). Lifestyle  Work with your doctor to stay at a healthy weight or to lose weight. Ask your doctor what the best weight is for you. Get at least 30 minutes of exercise most days of the week. This may include walking, swimming, or biking. Get at least 30 minutes of exercise that strengthens your muscles (resistance exercise) at least 3 days a week. This may include lifting weights or doing Pilates. Do not use any products that contain nicotine  or tobacco, such as cigarettes, e-cigarettes, and chewing tobacco. If you need help quitting, ask your doctor. Check your blood pressure at home as told by your doctor. Keep all follow-up visits as told by your doctor. This is important. Medicines Take over-the-counter and prescription medicines only as told by your doctor. Follow directions carefully. Do  not skip doses of blood pressure medicine. The medicine does not work as well if you skip doses. Skipping doses also puts you at risk for problems. Ask your doctor about side effects or reactions to medicines that you should watch for. Contact a doctor if you: Think you are having a reaction to the medicine you are taking. Have headaches that keep coming back (recurring). Feel dizzy. Have swelling in your ankles. Have trouble with your vision. Get help right away if you: Get a very bad headache. Start to feel mixed up (confused). Feel weak or numb. Feel faint. Have very bad pain in your: Chest. Belly (abdomen). Throw up more than once. Have trouble breathing. Summary Hypertension is another name for high blood pressure. High blood pressure forces your heart to work harder to pump blood. For most people, a normal blood pressure is less than 120/80. Making healthy choices can help lower blood pressure. If your blood pressure does not get lower with healthy choices, you may need to take medicine. This information is not intended to replace advice given to you by your health care provider. Make sure you discuss any questions you have with your health care provider. Document Revised: 02/16/2018 Document Reviewed: 02/16/2018 Elsevier Patient Education  [place]. Chronic Pain, Adult Chronic pain is a type of pain that lasts or keeps coming back for at least 3-6 months. You may have headaches, pain in the abdomen, or pain in other areas of the body. Chronic pain may be related to an illness, such as fibromyalgia or complex regional pain syndrome. Chronic pain may also be related to an injury or a health condition. Sometimes, the cause of chronic pain is not known. Chronic pain can make it hard for you to do daily activities. If not treated, chronic pain can lead to anxiety and depression. Treatment depends on the cause and severity of your pain. You may need to work with a pain  specialist to come up with a treatment plan. The plan may include medicine, counseling, and physical therapy. Many people benefit from a combination of two or more types of treatment to control their pain. Follow these instructions at home: Medicines Take over-the-counter and prescription medicines only as told by your health care provider. Ask your health care provider if the medicine prescribed to you: Requires you to avoid driving or using machinery. Can cause constipation. You may need to take these actions to prevent or treat constipation: Drink enough fluid to keep your urine pale yellow. Take over-the-counter or prescription medicines. Eat foods that are high in fiber, such as beans, whole grains, and fresh fruits and vegetables. Limit foods that are high in fat and processed sugars, such as fried or sweet foods. Treatment plan Follow your treatment plan as told by your health care provider. This may include: Gentle, regular exercise. Eating a healthy diet that includes foods such as vegetables, fruits, fish, and lean meats. Cognitive or behavioral therapy that changes the way you think or act in response to the pain. This may help improve how you feel. Working with a physical therapist. Meditation, yoga, acupuncture, or massage therapy. Aroma, color, light, or sound therapy.  Local electrical stimulation. The electrical pulses help to relieve pain by temporarily stopping the nerve impulses that cause you to feel pain. Injections. These deliver numbing or pain-relieving medicines into the spine or the area of pain.  Lifestyle  Ask your health care provider whether you should keep a pain diary. Your health care provider will tell you what information to write in the diary. This may include when you have pain, what the pain feels like, and how medicines and other behaviors or treatments help to reduce the pain. Consider talking with a mental health care provider about how to manage  chronic pain. Consider joining a chronic pain support group. Try to control or lower your stress levels. Talk with your health care provider about ways to do this. General instructions Learn as much as you can about how to manage your chronic pain. Ask your health care provider if an intensive pain rehabilitation program or a chronic pain specialist would be helpful. Check your pain level as told by your health care provider. Ask your health care provider if you should use a pain scale. It is up to you to get the results of any tests that were done. Ask your health care provider, or the department that is doing the tests, when your results will be ready. Keep all follow-up visits as told by your health care provider. This is important. Contact a health care provider if: Your pain gets worse, or you have new pain. You have trouble sleeping. You have trouble doing your normal activities. Your pain is not controlled with treatment. You have side effects from pain medicine. You feel weak. You notice any other changes that show that your condition is getting worse. Get help right away if: You lose feeling or have numbness in your body. You lose control of bowel or bladder function. Your pain suddenly gets much worse. You develop shaking or chills. You develop confusion. You develop chest pain. You have trouble breathing or shortness of breath. You pass out. You have thoughts about hurting yourself or others. If you ever feel like you may hurt yourself or others, or have thoughts about taking your own life, get help right away. Go to your nearest emergency department or: Call your local emergency services (911 in the [place] Call a suicide crisis helpline, such as the [facility] at (763)185-4682. This is open 24 hours a day in the [place] Text the Crisis Text Line at 3037868163 (in the [place].). Summary Chronic pain is a type of pain that lasts or keeps coming back for at  least 3-6 months. Chronic pain may be related to an illness, injury, or other health condition. Sometimes, the cause of chronic pain is not known. Treatment depends on the cause and severity of your pain. Many people benefit from a combination of two or more types of treatment to control their pain. Follow your treatment plan as told by your health care provider. This information is not intended to replace advice given to you by your health care provider. Make sure you discuss any questions you have with your health care provider. Document Revised: 02/23/2019 Document Reviewed: 02/23/2019 Elsevier Patient Education  2022 [place].  PATIENT GOALS/PLAN OF CARE:  Care Plan : RN Care Manager Plan of Care  Updates made by Dimitri Ped, RN since 04/22/2021 12:00 AM     Problem: Chronic Disease Management and Care Coordination Needs (HTN,osteoarthritis and HLD)   Priority: High     Long-Range Goal: Establish  Plan of Care for Chronic Disease Management Needs (HTN, Osteoarthris and HLD)   Start Date: 04/22/2021  Expected End Date: 10/19/2021  This Visit's Progress: On track  Priority: High  Note:   Current Barriers:  Chronic Disease Management support and education needs related to HTN, HLD, and Osteoarthritis Pt states that she does not check her B/P at home unless she feels bad.  States her lower back pain varies depending on her activity.  States she cares for her husband who has Alzheimer's.  States he is  not difficult to care for at this time.  Denies needing LCSW for caregiver stress.  States her children help if needed.  States she and her husband play golf 3 times a week for exercise.  States she did have frequent UTI's but that is improved with the help of cranberry extract and more fluids. States she tries to eat healthy and cooks at home most of the time  [facility] Clinical Goal(s):  Patient will verbalize understanding of plan for management of HTN, HLD, and  Osteoarthritis verbalize basic understanding of  HTN, HLD, and Osteoarthritis disease process and self health management plan   take all medications exactly as prescribed and will call provider for medication related questions attend all scheduled medical appointments: Flu vaccination 04/30/21 demonstrate Improved adherence to prescribed treatment plan for HTN, HLD, and Osteoarthritis as evidenced by readings within limits and adherence to plan of care continue to work with RN Care Manager to address care management and care coordination needs related to  HTN, HLD, and Osteoarthritis will demonstrate ongoing self health care management ability    through collaboration with RN Care manager, provider, and care team.   Interventions: 1:1 collaboration with primary care provider regarding development and update of comprehensive plan of care as evidenced by provider attestation and co-signature Inter-disciplinary care team collaboration (see longitudinal plan of care) Evaluation of current treatment plan related to  self management and patient's adherence to plan as established by provider  Pain Interventions: Pain assessment performed Medications reviewed Reviewed provider established plan for pain management; Discussed importance of adherence to all scheduled medical appointments; Counseled on the importance of reporting any/all new or changed pain symptoms or management strategies to pain management provider; Advised patient to report to care team affect of pain on daily activities; Discussed use of relaxation techniques and/or diversional activities to assist with pain reduction (distraction, imagery, relaxation, massage, acupressure, TENS, heat, and cold application; Reviewed with patient prescribed pharmacological and nonpharmacological pain relief strategies; Screening for signs and symptoms of depression related to chronic disease state;  Assessed social determinant of health barriers;    Hypertension Interventions: Last practice recorded BP readings:  BP Readings from Last 3 Encounters:  03/03/21 122/78  10/21/20 (!) 162/72  09/16/20 128/76  Most recent eGFR/CrCl: No results found for: EGFR  No components found for: CRCL  Evaluation of current treatment plan related to hypertension self management and patient's adherence to plan as established by provider; Provided education to patient re: stroke prevention, s/s of heart attack and stroke; Reviewed medications with patient and discussed importance of compliance; Counseled on the importance of exercise goals with target of 150 minutes per week Discussed plans with patient for ongoing care management follow up and provided patient with direct contact information for care management team; Advised patient, providing education and rationale, to monitor blood pressure daily and record, calling PCP for findings outside established parameters;  Provided education on prescribed diet low sodium low fat;  Hyperlipidemia  Interventions: Medication review performed; medication list updated in electronic medical record.  Provider established cholesterol goals reviewed; Counseled on importance of regular laboratory monitoring as prescribed; Reviewed importance of limiting foods high in cholesterol; Reviewed exercise goals and target of 150 minutes per week;  Patient Goals/Self-Care Activities: Patient will self administer medications as prescribed Patient will attend all scheduled provider appointments Patient will call pharmacy for medication refills Patient will attend church or other social activities Patient will call provider office for new concerns or questions check blood pressure weekly - choose a place to take my blood pressure (home, clinic or office, retail store) - write blood pressure results in a log or diary  Follow Up Plan:  Telephone follow up appointment with care management team member scheduled for:  06/26/21 at  3: 30 PM The patient has been provided with contact information for the care management team and has been advised to call with any health related questions or concerns.       Consent to CCM Services: Ms. Breitenstein was given information about Chronic Care Management services including:  CCM service includes personalized support from designated clinical staff supervised by her physician, including individualized plan of care and coordination with other care providers 24/7 contact phone numbers for assistance for urgent and routine care needs. Service will only be billed when office clinical staff spend 20 minutes or more in a month to coordinate care. Only one practitioner may furnish and bill the service in a calendar month. The patient may stop CCM services at any time (effective at the end of the month) by phone call to the office staff. The patient will be responsible for cost sharing (co-pay) of up to 20% of the service fee (after annual deductible is met).  Patient agreed to services and verbal consent obtained.   Patient verbalizes understanding of instructions provided today and agrees to view in [place].   Telephone follow up appointment with care management team member scheduled for: 06/26/21 at 3:30 PM [place], [facility], CDE Care Management Coordinator [facility] [phone], Mobile [phone]

## 2021-04-30 ENCOUNTER — Other Ambulatory Visit: Payer: Self-pay

## 2021-04-30 ENCOUNTER — Ambulatory Visit (INDEPENDENT_AMBULATORY_CARE_PROVIDER_SITE_OTHER): Payer: Medicare Other

## 2021-04-30 DIAGNOSIS — Z23 Encounter for immunization: Secondary | ICD-10-CM | POA: Diagnosis not present

## 2021-05-12 ENCOUNTER — Encounter: Payer: Medicare Other | Admitting: Family Medicine

## 2021-05-12 ENCOUNTER — Encounter: Payer: Self-pay | Admitting: Family Medicine

## 2021-05-14 ENCOUNTER — Encounter: Payer: Medicare Other | Admitting: Family Medicine

## 2021-05-21 DIAGNOSIS — M159 Polyosteoarthritis, unspecified: Secondary | ICD-10-CM

## 2021-05-21 DIAGNOSIS — I10 Essential (primary) hypertension: Secondary | ICD-10-CM

## 2021-05-27 ENCOUNTER — Ambulatory Visit (INDEPENDENT_AMBULATORY_CARE_PROVIDER_SITE_OTHER): Payer: Medicare Other | Admitting: Family Medicine

## 2021-05-27 ENCOUNTER — Encounter: Payer: Self-pay | Admitting: Family Medicine

## 2021-05-27 VITALS — BP 130/78 | HR 86 | Temp 97.6°F | Ht 65.0 in | Wt 157.5 lb

## 2021-05-27 DIAGNOSIS — F418 Other specified anxiety disorders: Secondary | ICD-10-CM | POA: Diagnosis not present

## 2021-05-27 DIAGNOSIS — R739 Hyperglycemia, unspecified: Secondary | ICD-10-CM | POA: Diagnosis not present

## 2021-05-27 DIAGNOSIS — F5101 Primary insomnia: Secondary | ICD-10-CM

## 2021-05-27 DIAGNOSIS — G8929 Other chronic pain: Secondary | ICD-10-CM

## 2021-05-27 DIAGNOSIS — I872 Venous insufficiency (chronic) (peripheral): Secondary | ICD-10-CM

## 2021-05-27 DIAGNOSIS — M810 Age-related osteoporosis without current pathological fracture: Secondary | ICD-10-CM | POA: Diagnosis not present

## 2021-05-27 DIAGNOSIS — M159 Polyosteoarthritis, unspecified: Secondary | ICD-10-CM

## 2021-05-27 DIAGNOSIS — I10 Essential (primary) hypertension: Secondary | ICD-10-CM | POA: Diagnosis not present

## 2021-05-27 DIAGNOSIS — M5440 Lumbago with sciatica, unspecified side: Secondary | ICD-10-CM

## 2021-05-27 DIAGNOSIS — K219 Gastro-esophageal reflux disease without esophagitis: Secondary | ICD-10-CM

## 2021-05-27 LAB — CBC WITH DIFFERENTIAL/PLATELET
Basophils Absolute: 0.1 10*3/uL (ref 0.0–0.1)
Basophils Relative: 0.8 % (ref 0.0–3.0)
Eosinophils Absolute: 0.2 10*3/uL (ref 0.0–0.7)
Eosinophils Relative: 2.1 % (ref 0.0–5.0)
HCT: 45.6 % (ref 36.0–46.0)
Hemoglobin: 14.8 g/dL (ref 12.0–15.0)
Lymphocytes Relative: 28.6 % (ref 12.0–46.0)
Lymphs Abs: 2.1 10*3/uL (ref 0.7–4.0)
MCHC: 32.3 g/dL (ref 30.0–36.0)
MCV: 93.4 fl (ref 78.0–100.0)
Monocytes Absolute: 0.5 10*3/uL (ref 0.1–1.0)
Monocytes Relative: 6.6 % (ref 3.0–12.0)
Neutro Abs: 4.6 10*3/uL (ref 1.4–7.7)
Neutrophils Relative %: 61.9 % (ref 43.0–77.0)
Platelets: 244 10*3/uL (ref 150.0–400.0)
RBC: 4.88 Mil/uL (ref 3.87–5.11)
RDW: 14.1 % (ref 11.5–15.5)
WBC: 7.4 10*3/uL (ref 4.0–10.5)

## 2021-05-27 LAB — BASIC METABOLIC PANEL
BUN: 16 mg/dL (ref 6–23)
CO2: 31 mEq/L (ref 19–32)
Calcium: 10.2 mg/dL (ref 8.4–10.5)
Chloride: 100 mEq/L (ref 96–112)
Creatinine, Ser: 0.86 mg/dL (ref 0.40–1.20)
GFR: 64.29 mL/min (ref >60.00)
Glucose, Bld: 77 mg/dL (ref 70–99)
Potassium: 3.9 mEq/L (ref 3.5–5.1)
Sodium: 139 mEq/L (ref 135–145)

## 2021-05-27 LAB — HEPATIC FUNCTION PANEL
ALT: 18 U/L (ref 0–35)
AST: 19 U/L (ref 0–37)
Albumin: 4.6 g/dL (ref 3.5–5.2)
Alkaline Phosphatase: 67 U/L (ref 39–117)
Bilirubin, Direct: 0.1 mg/dL (ref 0.0–0.3)
Total Bilirubin: 0.5 mg/dL (ref 0.2–1.2)
Total Protein: 7.5 g/dL (ref 6.0–8.3)

## 2021-05-27 LAB — LIPID PANEL
Cholesterol: 182 mg/dL (ref 0–200)
HDL: 50.6 mg/dL (ref >39.00)
LDL Cholesterol: 106 mg/dL — ABNORMAL HIGH (ref 0–99)
NonHDL: 131.06
Total CHOL/HDL Ratio: 4
Triglycerides: 127 mg/dL (ref 0.0–149.0)
VLDL: 25.4 mg/dL (ref 0.0–40.0)

## 2021-05-27 LAB — HEMOGLOBIN A1C: Hgb A1c MFr Bld: 6.2 % (ref 4.6–6.5)

## 2021-05-27 LAB — TSH: TSH: 1.24 u[IU]/mL (ref 0.35–5.50)

## 2021-05-27 NOTE — Progress Notes (Signed)
   Subjective:    Patient ID: Karen Watson, female    DOB: 03-May-1941, 80 y.o.   MRN: [id number]  HPI Here with her husband to follow up on issues. She is doing well. Her depresion and anxiety are well controlled and she sleeps well. Her HTN and OA are stable. Her low back pain is stable.    Review of Systems  Constitutional: Negative.   HENT: Negative.    Eyes: Negative.   Respiratory: Negative.    Cardiovascular: Negative.   Gastrointestinal: Negative.   Genitourinary:  Negative for decreased urine volume, difficulty urinating, dyspareunia, dysuria, enuresis, flank pain, frequency, hematuria, pelvic pain and urgency.  Musculoskeletal:  Positive for back pain.  Skin: Negative.   Neurological: Negative.  Negative for headaches.  Psychiatric/Behavioral: Negative.        Objective:   Physical Exam Constitutional:      General: She is not in acute distress.    Appearance: Normal appearance. She is well-developed.  HENT:     Head: Normocephalic and atraumatic.     Right Ear: External ear normal.     Left Ear: External ear normal.     Nose: Nose normal.     Mouth/Throat:     Pharynx: No oropharyngeal exudate.  Eyes:     General: No scleral icterus.    Conjunctiva/sclera: Conjunctivae normal.     Pupils: Pupils are equal, round, and reactive to light.  Neck:     Thyroid: No thyromegaly.     Vascular: No JVD.  Cardiovascular:     Rate and Rhythm: Normal rate and regular rhythm.     Heart sounds: Normal heart sounds. No murmur heard.   No friction rub. No gallop.  Pulmonary:     Effort: Pulmonary effort is normal. No respiratory distress.     Breath sounds: Normal breath sounds. No wheezing or rales.  Chest:     Chest wall: No tenderness.  Abdominal:     General: Bowel sounds are normal. There is no distension.     Palpations: Abdomen is soft. There is no mass.     Tenderness: There is no abdominal tenderness. There is no guarding or rebound.  Musculoskeletal:         General: No tenderness. Normal range of motion.     Cervical back: Normal range of motion and neck supple.  Lymphadenopathy:     Cervical: No cervical adenopathy.  Skin:    General: Skin is warm and dry.     Findings: No erythema or rash.  Neurological:     Mental Status: She is alert and oriented to person, place, and time.     Cranial Nerves: No cranial nerve deficit.     Motor: No abnormal muscle tone.     Coordination: Coordination normal.     Deep Tendon Reflexes: Reflexes are normal and symmetric. Reflexes normal.  Psychiatric:        Behavior: Behavior normal.        Thought Content: Thought content normal.        Judgment: Judgment normal.          Assessment & Plan:  Her HTN and GERD and OA and low back pain are well controlled. Her depression and anxiety and insomnia are doing well. We will get fasting labs to check lipids, etc.  We spent a total of ( 33  ) minutes reviewing records and discussing these issues.  Alysia Penna, MD

## 2021-06-01 ENCOUNTER — Telehealth: Payer: Medicare Other | Admitting: Nurse Practitioner

## 2021-06-01 DIAGNOSIS — R399 Unspecified symptoms and signs involving the genitourinary system: Secondary | ICD-10-CM | POA: Diagnosis not present

## 2021-06-01 MED ORDER — SULFAMETHOXAZOLE-TRIMETHOPRIM 800-160 MG PO TABS: 1.0000 | ORAL_TABLET | Freq: Two times a day (BID) | ORAL | 0 refills | Status: AC

## 2021-06-01 MED ORDER — NITROFURANTOIN MONOHYD MACRO 100 MG PO CAPS: 100.0000 mg | ORAL_CAPSULE | Freq: Two times a day (BID) | ORAL | 0 refills | Status: DC

## 2021-06-01 NOTE — Addendum Note (Signed)
Addended by: Cari Caraway on: 06/01/2021 01:42 PM   Modules accepted: Orders

## 2021-06-01 NOTE — Progress Notes (Signed)
E-Visit for Urinary Problems  We are sorry that you are not feeling well.  Here is how we plan to help!  Based on what you shared with me it looks like you most likely have a simple urinary tract infection.  A UTI (Urinary Tract Infection) is a bacterial infection of the bladder.  Most cases of urinary tract infections are simple to treat but a key part of your care is to encourage you to drink plenty of fluids and watch your symptoms carefully.  I have prescribed MacroBid 100 mg twice a day for 5 days.  Your symptoms should gradually improve. Call us if the burning in your urine worsens, you develop worsening fever, back pain or pelvic pain or if your symptoms do not resolve after completing the antibiotic.  I recommend a follow-up with your primary care provider for a urine culture if symptoms persist.   Urinary tract infections can be prevented by drinking plenty of water to keep your body hydrated.  Also be sure when you wipe, wipe from front to back and don't hold it in!  If possible, empty your bladder every 4 hours.  HOME CARE Drink plenty of fluids Compete the full course of the antibiotics even if the symptoms resolve Remember, when you need to go.go. Holding in your urine can increase the likelihood of getting a UTI! GET HELP RIGHT AWAY IF: You cannot urinate You get a high fever Worsening back pain occurs You see blood in your urine You feel sick to your stomach or throw up You feel like you are going to pass out  MAKE SURE YOU  Understand these instructions. Will watch your condition. Will get help right away if you are not doing well or get worse.   Thank you for choosing an e-visit.  Your e-visit answers were reviewed by a board certified advanced clinical practitioner to complete your personal care plan. Depending upon the condition, your plan could have included both over the counter or prescription medications.  Please review your pharmacy choice. Make sure the  pharmacy is open so you can pick up prescription now. If there is a problem, you may contact your provider through [employer] and have the prescription routed to another pharmacy.  Your safety is important to [place]. If you have drug allergies check your prescription carefully.   For the next 24 hours you can use MyChart to ask questions about today's visit, request a non-urgent call back, or ask for a work or school excuse. You will get an email in the next two days asking about your experience. I hope that your e-visit has been valuable and will speed your recovery.   I have spent at least 5 minutes reviewing and documenting in the patient's chart.

## 2021-06-12 ENCOUNTER — Other Ambulatory Visit: Payer: Self-pay | Admitting: Family Medicine

## 2021-06-30 ENCOUNTER — Ambulatory Visit (INDEPENDENT_AMBULATORY_CARE_PROVIDER_SITE_OTHER): Payer: Medicare Other

## 2021-06-30 DIAGNOSIS — M159 Polyosteoarthritis, unspecified: Secondary | ICD-10-CM

## 2021-06-30 DIAGNOSIS — I10 Essential (primary) hypertension: Secondary | ICD-10-CM

## 2021-06-30 NOTE — Patient Instructions (Signed)
Visit Information  Thank you for taking time to visit with me today. Please don't hesitate to contact me if I can be of assistance to you before our next scheduled telephone appointment.  Following are the goals we discussed today:  Take all medications as prescribed Attend all scheduled provider appointments Call pharmacy for medication refills 3-7 days in advance of running out of medications Perform all self care activities independently  Perform IADL's (shopping, preparing meals, housekeeping, managing finances) independently Call provider office for new concerns or questions  check blood pressure weekly choose a place to take my blood pressure (home, clinic or office, retail store) take blood pressure log to all doctor appointments keep all doctor appointments eat more whole grains, fruits and vegetables, lean meats and healthy fats limit salt intake to 2300mg /day adhere to prescribed diet: low sodium low fat develop an exercise routine  Our next appointment is by telephone on 09/23/21 at 3 PM  Please call the care guide team at [phone] if you need to cancel or reschedule your appointment.   If you are experiencing a Mental Health or [place] or need someone to talk to, please call the Suicide and Crisis Lifeline: 988 call the [place] National Suicide Prevention Lifeline: 616-090-0788 or TTY: 731-061-3341 TTY 201 038 5117) to talk to a trained counselor go to [facility] [address], [place] [phone]) call 911   Patient verbalizes understanding of instructions provided today and agrees to view in [place].  Peter Garter RN, Jackquline Denmark, CDE Care Management Coordinator [facility] [phone], [place] [phone]

## 2021-06-30 NOTE — Chronic Care Management (AMB) (Signed)
Chronic Care Management   CCM RN Visit Note  06/30/2021 Name: Karen Watson MRN: [id number] DOB: 10-19-1940  Subjective: Karen Watson is a 81 y.o. year old female who is a primary care patient of Laurey Morale, MD. The care management team was consulted for assistance with disease management and care coordination needs.    Engaged with patient by telephone for follow up visit in response to provider referral for case management and/or care coordination services.   Consent to Services:  The patient was given information about Chronic Care Management services, agreed to services, and gave verbal consent prior to initiation of services.  Please see initial visit note for detailed documentation.   Patient agreed to services and verbal consent obtained.   Assessment: Review of patient past medical history, allergies, medications, health status, including review of consultants reports, laboratory and other test data, was performed as part of comprehensive evaluation and provision of chronic care management services.   SDOH (Social Determinants of Health) assessments and interventions performed:    CCM Care Plan  Allergies  Allergen Reactions   Carbocaine [Mepivacaine Hcl] Hives and Nausea And Vomiting   Codeine Anaphylaxis, Shortness Of Breath, Nausea And Vomiting and Other (See Comments)    Respiratory distress; Low blood pressure. Pt tolerates percocet.   Hydrocodone Bit-Homatrop Mbr Anaphylaxis, Nausea And Vomiting and Shortness Of Breath    Dangerously low blood pressure, respiratory distress; heaviness in chest   Metrizamide Hives    Pre-medicated with Benadryl 75m PO   Propoxyphene N-Acetaminophen Anaphylaxis and Nausea And Vomiting    "Darvocet" t; Lightheadedness, confusion   Robaxin [Methocarbamol] Other (See Comments)    "severe Altzheimer's symptoms"   Tape Rash and Other (See Comments)    Surgical tape causes rash and big blisters   Tramadol Other (See Comments)     Low respiratory rate, nausea, chest heaviness   Vicodin [Hydrocodone-Acetaminophen] Anaphylaxis   Cefazolin Hives, Nausea And Vomiting and Other (See Comments)    "Ancef"; Also, severe stomach pain   Iodinated Contrast Media Hives    Pre-medicated with Benadryl 550mPO   Penicillins Hives, Nausea And Vomiting and Other (See Comments)    severe stomach pain, Has patient had a PCN reaction causing immediate rash, facial/tongue/throat swelling, SOB or lightheadedness with hypotension: Yes Has patient had a PCN reaction causing severe rash involving mucus membranes or skin necrosis: No Has patient had a PCN reaction that required hospitalization No Has patient had a PCN reaction occurring within the last 10 years: Yes If all of the above answers are "NO", then may proceed with Cephalosporin use.    Amoxil [Amoxicillin]     Itching and rash over body, N & V, itchy scalp    Nsaids Other (See Comments)    Bruising; use with caution    Outpatient Encounter Medications as of 06/30/2021  Medication Sig   acetaminophen (TYLENOL) 500 MG tablet Take 1,000 mg by mouth 3 (three) times daily as needed for mild pain.    amLODipine (NORVASC) 5 MG tablet TAKE 1 TABLET BY MOUTH  DAILY   b complex vitamins tablet Take 1 tablet by mouth daily.   Calcium Carb-Cholecalciferol (CALCIUM 600 + D PO) Take 600 mg by mouth 2 (two) times daily.   fish oil-omega-3 fatty acids 1000 MG capsule Take 1 g by mouth daily.   gabapentin (NEURONTIN) 300 MG capsule TAKE 1 CAPSULE BY MOUTH 3  TIMES DAILY   MAGNESIUM-ZINC PO Take 1 tablet by mouth every other  day.    meclizine (ANTIVERT) 25 MG tablet Take 1 tablet (25 mg total) by mouth every 4 (four) hours as needed for dizziness.   Melatonin 10 MG CAPS Take 10 mg by mouth.   MULTIPLE VITAMIN PO Take 1 tablet by mouth daily.   nitrofurantoin, macrocrystal-monohydrate, (MACROBID) 100 MG capsule Take 1 capsule (100 mg total) by mouth 2 (two) times daily.   potassium chloride SA  (KLOR-CON) 20 MEQ tablet TAKE 1 TABLET BY MOUTH  TWICE DAILY   triamcinolone cream (KENALOG) 0.1 % Apply 1 application topically 2 (two) times daily.   triamterene-hydrochlorothiazide (MAXZIDE-25) 37.5-25 MG tablet TAKE 1 TABLET BY MOUTH  DAILY   urea (CARMOL) 20 % cream Apply topically as needed.   vitamin C (ASCORBIC ACID) 500 MG tablet Take 500 mg by mouth daily as needed (in winter months).   vitamin E 400 UNIT capsule Take 400 Units by mouth daily.   No facility-administered encounter medications on file as of 06/30/2021.    Patient Active Problem List   Diagnosis Date Noted   Gastroesophageal reflux disease 08/19/2020   COVID-19 virus infection 08/19/2020   Depression with anxiety 01/21/2018   Chronic venous insufficiency 04/29/2016   Insomnia 02/12/2016   Primary localized osteoarthritis of right hip 01/28/2016   Primary osteoarthritis of right hip 01/07/2016   S/P lumbar laminectomy 10/09/2015   Osteoporosis 07/04/2015   Arthropathy 04/30/2008   NECK PAIN 10/21/2007   Osteoarthritis 06/22/2007   LEG CRAMPS 06/22/2007   Essential hypertension 03/03/2007   LOW BACK PAIN 03/03/2007   HEADACHE 03/03/2007    Conditions to be addressed/monitored:HTN, HLD, and Osteoarthritis  Care Plan : RN Care Manager Plan of Care  Updates made by Dimitri Ped, RN since 06/30/2021 12:00 AM     Problem: Chronic Disease Management and Care Coordination Needs (HTN,osteoarthritis and HLD)   Priority: High     Long-Range Goal: Establish Plan of Care for Chronic Disease Management Needs (HTN, Osteoarthris and HLD)   Start Date: 04/22/2021  Expected End Date: 10/19/2021  Recent Progress: On track  Priority: High  Note:   Current Barriers:  Chronic Disease Management support and education needs related to HTN, HLD, and Osteoarthritis Pt states that she has not  been checking her B/P at home unless she feels bad.  States her lower back pain varies depending on her activity.  States she cares  for her husband who has Alzheimer's.  States he is  not difficult to care for at this time.  Denies needing LCSW for caregiver stress.  States her children help if needed.  States she and her husband play golf 3 times a week for exercise.  States she had a recent UTI but that is improved with antibiotics. States she is drinking cranberry juice, taking  cranberry extract and drinking more fluids. States she tries to eat healthy and cooks at home most of the time  [facility] Clinical Goal(s):  Patient will verbalize understanding of plan for management of HTN, HLD, and Osteoarthritis as evidenced by voiced adherence to plan of care verbalize basic understanding of  HTN, HLD, and Osteoarthritis disease process and self health management plan as evidenced by voiced understanding and teach back take all medications exactly as prescribed and will call provider for medication related questions as evidenced by dispense report and pt verbalization attend all scheduled medical appointments: no scheduled provider visit scheduled as evidenced by medical records demonstrate Ongoing adherence to prescribed treatment plan for HTN, HLD, and Osteoarthritis as evidenced  by readings within limits and adherence to plan of care continue to work with RN Care Manager to address care management and care coordination needs related to  HTN, HLD, and Osteoarthritis as evidenced by adherence to CM Team Scheduled appointments through collaboration with RN Care manager, provider, and care team.   Interventions: 1:1 collaboration with primary care provider regarding development and update of comprehensive plan of care as evidenced by provider attestation and co-signature Inter-disciplinary care team collaboration (see longitudinal plan of care) Evaluation of current treatment plan related to  self management and patient's adherence to plan as established by provider  Pain Interventions:  (Status:  Goal on track:  Yes.) Long Term  Goal Pain assessment performed Medications reviewed Reviewed provider established plan for pain management Discussed importance of adherence to all scheduled medical appointments Advised patient to report to care team affect of pain on daily activities Discussed use of relaxation techniques and/or diversional activities to assist with pain reduction (distraction, imagery, relaxation, massage, acupressure, TENS, heat, and cold application Reviewed to keep active to help with arthritis    Hyperlipidemia Interventions:  (Status:  Goal on track:  Yes.) Long Term Goal Medication review performed; medication list updated in electronic medical record.  Provider established cholesterol goals reviewed Counseled on importance of regular laboratory monitoring as prescribed Reviewed importance of limiting foods high in cholesterol Reviewed exercise goals and target of 150 minutes per week Reviewed ways to lower lipids without taking a statin  Hypertension Interventions:  (Status:  Goal on track:  Yes.) Long Term Goal Last practice recorded BP readings:  BP Readings from Last 3 Encounters:  05/27/21 130/78  03/03/21 122/78  10/21/20 (!) 162/72  Most recent eGFR/CrCl: No results found for: EGFR  No components found for: CRCL  Evaluation of current treatment plan related to hypertension self management and patient's adherence to plan as established by provider Provided education to patient re: stroke prevention, s/s of heart attack and stroke Counseled on the importance of exercise goals with target of 150 minutes per week Advised patient, providing education and rationale, to monitor blood pressure daily and record, calling PCP for findings outside established parameters Provided education on prescribed diet low sodium low fat    Patient Goals/Self-Care Activities: Take all medications as prescribed Attend all scheduled provider appointments Call pharmacy for medication refills 3-7 days in  advance of running out of medications Perform all self care activities independently  Perform IADL's (shopping, preparing meals, housekeeping, managing finances) independently Call provider office for new concerns or questions  check blood pressure weekly choose a place to take my blood pressure (home, clinic or office, retail store) take blood pressure log to all doctor appointments keep all doctor appointments eat more whole grains, fruits and vegetables, lean meats and healthy fats limit salt intake to 2370m/day adhere to prescribed diet: low sodium low fat develop an exercise routine  Follow Up Plan:  Telephone follow up appointment with care management team member scheduled for:  09/23/21 The patient has been provided with contact information for the care management team and has been advised to call with any health related questions or concerns.       Plan:Telephone follow up appointment with care management team member scheduled for:  09/23/21 The patient has been provided with contact information for the care management team and has been advised to call with any health related questions or concerns.  MPeter GarterRN, BJackquline Denmark CDE Care Management Coordinator [facility] [phone] [place] [phone]

## 2021-07-17 DIAGNOSIS — D3A090 Benign carcinoid tumor of the bronchus and lung: Secondary | ICD-10-CM | POA: Diagnosis not present

## 2021-07-17 DIAGNOSIS — Z9889 Other specified postprocedural states: Secondary | ICD-10-CM | POA: Diagnosis not present

## 2021-07-17 DIAGNOSIS — C7B8 Other secondary neuroendocrine tumors: Secondary | ICD-10-CM | POA: Diagnosis not present

## 2021-07-17 DIAGNOSIS — C7A00 Malignant carcinoid tumor of unspecified site: Secondary | ICD-10-CM | POA: Diagnosis not present

## 2021-07-22 DIAGNOSIS — M159 Polyosteoarthritis, unspecified: Secondary | ICD-10-CM

## 2021-07-22 DIAGNOSIS — I10 Essential (primary) hypertension: Secondary | ICD-10-CM | POA: Diagnosis not present

## 2021-07-28 ENCOUNTER — Telehealth: Payer: Self-pay | Admitting: Family Medicine

## 2021-07-28 ENCOUNTER — Other Ambulatory Visit: Payer: Self-pay

## 2021-07-28 DIAGNOSIS — R42 Dizziness and giddiness: Secondary | ICD-10-CM

## 2021-07-28 MED ORDER — MECLIZINE HCL 25 MG PO TABS: 25.0000 mg | ORAL_TABLET | ORAL | 1 refills | Status: DC | PRN

## 2021-07-28 NOTE — Telephone Encounter (Signed)
Refill sent to [facility].   Message sent to patient via my chart that refill has been sent.

## 2021-07-28 NOTE — Telephone Encounter (Signed)
Patient called in requesting a refill for meclizine (ANTIVERT) 25 MG tablet [[id number]] to be sent to her pharmacy.  The pharmacy is [id number] - [place], [place] - [address]  [place], [place] [address]   Patient could be contacted at [phone].  Please advise.

## 2021-09-01 ENCOUNTER — Other Ambulatory Visit: Payer: Self-pay | Admitting: Family Medicine

## 2021-09-01 DIAGNOSIS — I10 Essential (primary) hypertension: Secondary | ICD-10-CM

## 2021-09-23 ENCOUNTER — Ambulatory Visit (INDEPENDENT_AMBULATORY_CARE_PROVIDER_SITE_OTHER): Payer: Medicare Other

## 2021-09-23 DIAGNOSIS — I10 Essential (primary) hypertension: Secondary | ICD-10-CM

## 2021-09-23 DIAGNOSIS — M810 Age-related osteoporosis without current pathological fracture: Secondary | ICD-10-CM

## 2021-09-23 DIAGNOSIS — M159 Polyosteoarthritis, unspecified: Secondary | ICD-10-CM

## 2021-09-23 NOTE — Chronic Care Management (AMB) (Signed)
?Chronic Care Management  ? ?CCM RN Visit Note ? ?09/23/2021 ?Name: Karen Watson MRN: [id number] DOB: Jan 28, 1941 ? ?Subjective: ?Karen Watson is a 81 y.o. year old female who is a primary care patient of Laurey Morale, MD. The care management team was consulted for assistance with disease management and care coordination needs.   ? ?Engaged with patient by telephone for follow up visit in response to provider referral for case management and/or care coordination services.  ? ?Consent to Services:  ?The patient was given information about Chronic Care Management services, agreed to services, and gave verbal consent prior to initiation of services.  Please see initial visit note for detailed documentation.  ? ?Patient agreed to services and verbal consent obtained.  ? ?Assessment: Review of patient past medical history, allergies, medications, health status, including review of consultants reports, laboratory and other test data, was performed as part of comprehensive evaluation and provision of chronic care management services.  ? ?SDOH (Social Determinants of Health) assessments and interventions performed:   ? ?CCM Care Plan ? ?Allergies  ?Allergen Reactions  ? Carbocaine [Mepivacaine Hcl] Hives and Nausea And Vomiting  ? Codeine Anaphylaxis, Shortness Of Breath, Nausea And Vomiting and Other (See Comments)  ?  Respiratory distress; Low blood pressure. Pt tolerates percocet.  ? Hydrocodone Bit-Homatrop Mbr Anaphylaxis, Nausea And Vomiting and Shortness Of Breath  ?  Dangerously low blood pressure, respiratory distress; heaviness in chest  ? Metrizamide Hives  ?  Pre-medicated with Benadryl 50mg  PO  ? Propoxyphene N-Acetaminophen Anaphylaxis and Nausea And Vomiting  ?  "Darvocet" t; Lightheadedness, confusion  ? Robaxin [Methocarbamol] Other (See Comments)  ?  "severe Altzheimer's symptoms"  ? Tape Rash and Other (See Comments)  ?  Surgical tape causes rash and big blisters  ? Tramadol Other (See Comments)  ?   Low respiratory rate, nausea, chest heaviness  ? Vicodin [Hydrocodone-Acetaminophen] Anaphylaxis  ? Cefazolin Hives, Nausea And Vomiting and Other (See Comments)  ?  "Ancef"; Also, severe stomach pain  ? Iodinated Contrast Media Hives  ?  Pre-medicated with Benadryl 50mg  PO  ? Penicillins Hives, Nausea And Vomiting and Other (See Comments)  ?  severe stomach pain, Has patient had a PCN reaction causing immediate rash, facial/tongue/throat swelling, SOB or lightheadedness with hypotension: Yes ?Has patient had a PCN reaction causing severe rash involving mucus membranes or skin necrosis: No ?Has patient had a PCN reaction that required hospitalization No ?Has patient had a PCN reaction occurring within the last 10 years: Yes ?If all of the above answers are "NO", then may proceed with Cephalosporin use. ?  ? Amoxil [Amoxicillin]   ?  Itching and rash over body, N & V, itchy scalp   ? Nsaids Other (See Comments)  ?  Bruising; use with caution  ? ? ?Outpatient Encounter Medications as of 09/23/2021  ?Medication Sig  ? acetaminophen (TYLENOL) 500 MG tablet Take 1,000 mg by mouth 3 (three) times daily as needed for mild pain.   ? amLODipine (NORVASC) 5 MG tablet TAKE 1 TABLET BY MOUTH  DAILY  ? b complex vitamins tablet Take 1 tablet by mouth daily.  ? Calcium Carb-Cholecalciferol (CALCIUM 600 + D PO) Take 600 mg by mouth 2 (two) times daily.  ? fish oil-omega-3 fatty acids 1000 MG capsule Take 1 g by mouth daily.  ? gabapentin (NEURONTIN) 300 MG capsule TAKE 1 CAPSULE BY MOUTH 3  TIMES DAILY  ? MAGNESIUM-ZINC PO Take 1 tablet by mouth every other  day.   ? meclizine (ANTIVERT) 25 MG tablet Take 1 tablet (25 mg total) by mouth every 4 (four) hours as needed for dizziness.  ? Melatonin 10 MG CAPS Take 10 mg by mouth.  ? MULTIPLE VITAMIN PO Take 1 tablet by mouth daily.  ? nitrofurantoin, macrocrystal-monohydrate, (MACROBID) 100 MG capsule Take 1 capsule (100 mg total) by mouth 2 (two) times daily.  ? potassium chloride SA  (KLOR-CON M) 20 MEQ tablet TAKE 1 TABLET BY MOUTH  TWICE DAILY  ? triamcinolone cream (KENALOG) 0.1 % Apply 1 application topically 2 (two) times daily.  ? triamterene-hydrochlorothiazide (MAXZIDE-25) 37.5-25 MG tablet TAKE 1 TABLET BY MOUTH DAILY  ? urea (CARMOL) 20 % cream Apply topically as needed.  ? vitamin C (ASCORBIC ACID) 500 MG tablet Take 500 mg by mouth daily as needed (in winter months).  ? vitamin E 400 UNIT capsule Take 400 Units by mouth daily.  ? ?No facility-administered encounter medications on file as of 09/23/2021.  ? ? ?Patient Active Problem List  ? Diagnosis Date Noted  ? Gastroesophageal reflux disease 08/19/2020  ? COVID-19 virus infection 08/19/2020  ? Depression with anxiety 01/21/2018  ? Chronic venous insufficiency 04/29/2016  ? Insomnia 02/12/2016  ? Primary localized osteoarthritis of right hip 01/28/2016  ? Primary osteoarthritis of right hip 01/07/2016  ? S/P lumbar laminectomy 10/09/2015  ? Osteoporosis 07/04/2015  ? Arthropathy 04/30/2008  ? NECK PAIN 10/21/2007  ? Osteoarthritis 06/22/2007  ? LEG CRAMPS 06/22/2007  ? Essential hypertension 03/03/2007  ? LOW BACK PAIN 03/03/2007  ? HEADACHE 03/03/2007  ? ? ?Conditions to be addressed/monitored:HTN, HLD, and Osteoarthritis ? ?Care Plan : RN Care Manager Plan of Care  ?Updates made by Dimitri Ped, RN since 09/23/2021 12:00 AM  ?  ? ?Problem: Chronic Disease Management and Care Coordination Needs (HTN,osteoarthritis and HLD)   ?Priority: High  ?  ? ?Long-Range Goal: Establish Plan of Care for Chronic Disease Management Needs (HTN, Osteoarthris and HLD)   ?Start Date: 04/22/2021  ?Expected End Date: 10/19/2021  ?Recent Progress: On track  ?Priority: High  ?Note:   ?Current Barriers:  ?Chronic Disease Management support and education needs related to HTN, HLD, and Osteoarthritis ?Pt states that she has not  been checking her B/P at home regularly but it was 133/78 the last time she checked it.  States her lower back pain varies  depending on her activity but it has not been bothering her very much recently.  States she cares for her husband who has Alzheimer's.  States he is  not difficult to care for at this time.  Denies needing LCSW for caregiver stress.  States her children help if needed.  States she and her husband continue to play golf 3 times a week for exercise.  States she tries to eat healthy and cooks at home most of the time ? ?RNCM Clinical Goal(s):  ?Patient will verbalize understanding of plan for management of HTN, HLD, and Osteoarthritis as evidenced by voiced adherence to plan of care ?verbalize basic understanding of  HTN, HLD, and Osteoarthritis disease process and self health management plan as evidenced by voiced understanding and teach back ?take all medications exactly as prescribed and will call provider for medication related questions as evidenced by dispense report and pt verbalization ?attend all scheduled medical appointments: no scheduled provider visit scheduled as evidenced by medical records ?demonstrate Ongoing adherence to prescribed treatment plan for HTN, HLD, and Osteoarthritis as evidenced by readings within limits and adherence to  plan of care ?continue to work with RN Care Manager to address care management and care coordination needs related to  HTN, HLD, and Osteoarthritis as evidenced by adherence to CM Team Scheduled appointments through collaboration with RN Care manager, provider, and care team.  ? ?Interventions: ?1:1 collaboration with primary care provider regarding development and update of comprehensive plan of care as evidenced by provider attestation and co-signature ?Inter-disciplinary care team collaboration (see longitudinal plan of care) ?Evaluation of current treatment plan related to  self management and patient's adherence to plan as established by provider ? ?Pain Interventions:  (Status:  Goal on track:  Yes.) Long Term Goal ?Pain assessment performed ?Medications  reviewed ?Reviewed provider established plan for pain management ?Discussed importance of adherence to all scheduled medical appointments ?Advised patient to report to care team affect of pain on daily activities ?Discussed

## 2021-09-23 NOTE — Patient Instructions (Signed)
Visit Information ? ?Thank you for taking time to visit with me today. Please don't hesitate to contact me if I can be of assistance to you before our next scheduled telephone appointment. ? ?Following are the goals we discussed today:  ?Take all medications as prescribed ?Attend all scheduled provider appointments ?Call pharmacy for medication refills 3-7 days in advance of running out of medications ?Perform all self care activities independently  ?Perform IADL's (shopping, preparing meals, housekeeping, managing finances) independently ?Call provider office for new concerns or questions  ?check blood pressure weekly ?choose a place to take my blood pressure (home, clinic or office, retail store) ?take blood pressure log to all doctor appointments ?keep all doctor appointments ?eat more whole grains, fruits and vegetables, lean meats and healthy fats ?limit salt intake to 2300mg /day ?adhere to prescribed diet: low sodium low fat ?develop an exercise routine ? ?Our next appointment is by telephone on 12/25/21 at 3 PM ? ?Please call the care guide team at [phone] if you need to cancel or reschedule your appointment.  ? ?If you are experiencing a Mental Health or [place] or need someone to talk to, please call the Suicide and Crisis Lifeline: 988 ?call the [place] National Suicide Prevention Lifeline: (607)341-2762 or TTY: 239 758 3334 TTY (930)314-0231) to talk to a trained counselor ?call 1-800-273-TALK (toll free, 24 hour hotline) ?go to [facility] [address] [place] [phone]) ?call 911  ? ?Patient verbalizes understanding of instructions and care plan provided today and agrees to view in [place]. Active MyChart status confirmed with patient.   ? ?Peter Garter RN, BSN,CCM, CDE ?Care Management Coordinator ?[facility] ?[phone]   ?

## 2021-10-07 ENCOUNTER — Encounter: Payer: Self-pay | Admitting: Family Medicine

## 2021-10-07 ENCOUNTER — Ambulatory Visit (INDEPENDENT_AMBULATORY_CARE_PROVIDER_SITE_OTHER): Payer: Medicare Other | Admitting: Family Medicine

## 2021-10-07 VITALS — BP 122/60 | HR 75 | Temp 98.5°F | Wt 163.0 lb

## 2021-10-07 DIAGNOSIS — W57XXXA Bitten or stung by nonvenomous insect and other nonvenomous arthropods, initial encounter: Secondary | ICD-10-CM | POA: Diagnosis not present

## 2021-10-07 DIAGNOSIS — S30861A Insect bite (nonvenomous) of abdominal wall, initial encounter: Secondary | ICD-10-CM | POA: Diagnosis not present

## 2021-10-07 MED ORDER — DOXYCYCLINE HYCLATE 100 MG PO CAPS: 100.0000 mg | ORAL_CAPSULE | Freq: Two times a day (BID) | ORAL | 0 refills | Status: AC

## 2021-10-07 NOTE — Progress Notes (Signed)
? ?  Subjective:  ? ? Patient ID: Karen Watson, female    DOB: 11-01-1940, 81 y.o.   MRN: [id number] ? ?HPI ?Here for a tick bite that she discovered on 10-04-21. After her husband had come in from walking their dog, she felt an itch on her left flank. On inspection they found a tick crawling on her that was not engorged or embedded. They pulled it off. Over the next 24 hours the area turned red and she began to feel dull muscle aches all over her body. Several days later they found another smaller red area on her back. No fever. The areas still itch but she denies any burning pains in the area.  ? ? ?Review of Systems  ?Constitutional: Negative.   ?Respiratory: Negative.    ?Cardiovascular: Negative.   ?Musculoskeletal:  Positive for myalgias. Negative for arthralgias.  ?Skin:  Positive for rash.  ? ?   ?Objective:  ? Physical Exam ?Constitutional:   ?   Appearance: Normal appearance. She is not ill-appearing.  ?Cardiovascular:  ?   Rate and Rhythm: Normal rate and regular rhythm.  ?   Pulses: Normal pulses.  ?   Heart sounds: Normal heart sounds.  ?Pulmonary:  ?   Effort: Pulmonary effort is normal.  ?   Breath sounds: Normal breath sounds.  ?Skin: ?   Comments: There is a 3 cm by 1 cm macular red blanchable area on the left flank. There is also a small red maculopapular area on the left lower back. No vesicles are seen   ?Neurological:  ?   Mental Status: She is alert.  ? ? ? ? ? ?   ?Assessment & Plan:  ?Tick bite with a local reaction. We will cover with 10 days of Doxycycline. I advised her to keep an eye on this area and to let us know if any more red spots appear. If this occurs, it would raise the possibility of shingles.  ?Alysia Penna, MD ? ? ?

## 2021-10-19 DIAGNOSIS — E785 Hyperlipidemia, unspecified: Secondary | ICD-10-CM | POA: Diagnosis not present

## 2021-10-19 DIAGNOSIS — I10 Essential (primary) hypertension: Secondary | ICD-10-CM

## 2021-10-23 ENCOUNTER — Ambulatory Visit (INDEPENDENT_AMBULATORY_CARE_PROVIDER_SITE_OTHER): Payer: Medicare Other

## 2021-10-23 VITALS — Ht 65.0 in | Wt 155.0 lb

## 2021-10-23 DIAGNOSIS — Z0000 Encounter for general adult medical examination without abnormal findings: Secondary | ICD-10-CM | POA: Diagnosis not present

## 2021-10-23 NOTE — Patient Instructions (Addendum)
?[name] , ?Thank you for taking time to come for your Medicare Wellness Visit. I appreciate your ongoing commitment to your health goals. Please review the following plan we discussed and let me know if I can assist you in the future.  ? ?These are the goals we discussed: ? Goals   ? ?   Weight (lb) < 200 lb (90.7 kg) (pt-stated)   ?   I would like to get out walking more ?  ? ?  ?  ?This is a list of the screening recommended for you and due dates:  ?Health Maintenance  ?Topic Date Due  ? COVID-19 Vaccine (1) 11/08/2021*  ? Zoster (Shingles) Vaccine (1 of 2) 01/23/2022*  ? Tetanus Vaccine  06/20/2022*  ? Hepatitis C Screening: USPSTF Recommendation to screen - Ages 18-79 yo.  10/24/2022*  ? Flu Shot  01/20/2022  ? Pneumonia Vaccine  Completed  ? DEXA scan (bone density measurement)  Completed  ? HPV Vaccine  Aged Out  ?*Topic was postponed. The date shown is not the original due date.  ? ?Advanced directives: Yes Patient will submit copy ? ?Conditions/risks identified: None ? ?Next appointment: Follow up in one year for your annual wellness visit  ? ? ?Preventive Care 41 Years and Older, Female ?Preventive care refers to lifestyle choices and visits with your health care provider that can promote health and wellness. ?What does preventive care include? ?A yearly physical exam. This is also called an annual well check. ?Dental exams once or twice a year. ?Routine eye exams. Ask your health care provider how often you should have your eyes checked. ?Personal lifestyle choices, including: ?Daily care of your teeth and gums. ?Regular physical activity. ?Eating a healthy diet. ?Avoiding tobacco and drug use. ?Limiting alcohol use. ?Practicing safe sex. ?Taking low-dose aspirin every day. ?Taking vitamin and mineral supplements as recommended by your health care provider. ?What happens during an annual well check? ?The services and screenings done by your health care provider during your annual well check will depend  on your age, overall health, lifestyle risk factors, and family history of disease. ?Counseling  ?Your health care provider may ask you questions about your: ?Alcohol use. ?Tobacco use. ?Drug use. ?Emotional well-being. ?Home and relationship well-being. ?Sexual activity. ?Eating habits. ?History of falls. ?Memory and ability to understand (cognition). ?Work and work Statistician. ?Reproductive health. ?Screening  ?You may have the following tests or measurements: ?Height, weight, and BMI. ?Blood pressure. ?Lipid and cholesterol levels. These may be checked every 5 years, or more frequently if you are over 95 years old. ?Skin check. ?Lung cancer screening. You may have this screening every year starting at age 67 if you have a 30-pack-year history of smoking and currently smoke or have quit within the past 15 years. ?Fecal occult blood test (FOBT) of the stool. You may have this test every year starting at age 50. ?Flexible sigmoidoscopy or colonoscopy. You may have a sigmoidoscopy every 5 years or a colonoscopy every 10 years starting at age 31. ?Hepatitis C blood test. ?Hepatitis B blood test. ?Sexually transmitted disease (STD) testing. ?Diabetes screening. This is done by checking your blood sugar (glucose) after you have not eaten for a while (fasting). You may have this done every 1-3 years. ?Bone density scan. This is done to screen for osteoporosis. You may have this done starting at age 61. ?Mammogram. This may be done every 1-2 years. Talk to your health care provider about how often you should have regular  mammograms. ?Talk with your health care provider about your test results, treatment options, and if necessary, the need for more tests. ?Vaccines  ?Your health care provider may recommend certain vaccines, such as: ?Influenza vaccine. This is recommended every year. ?Tetanus, diphtheria, and acellular pertussis (Tdap, Td) vaccine. You may need a Td booster every 10 years. ?Zoster vaccine. You may need  this after age 76. ?Pneumococcal 13-valent conjugate (PCV13) vaccine. One dose is recommended after age 29. ?Pneumococcal polysaccharide (PPSV23) vaccine. One dose is recommended after age 44. ?Talk to your health care provider about which screenings and vaccines you need and how often you need them. ?This information is not intended to replace advice given to you by your health care provider. Make sure you discuss any questions you have with your health care provider. ?Document Released: 07/05/2015 Document Revised: 02/26/2016 Document Reviewed: 04/09/2015 ?Elsevier Interactive Patient Education ? 2017 [employer]. ? ?Fall Prevention in the Home ?Falls can cause injuries. They can happen to people of all ages. There are many things you can do to make your home safe and to help prevent falls. ?What can I do on the outside of my home? ?Regularly fix the edges of walkways and driveways and fix any cracks. ?Remove anything that might make you trip as you walk through a door, such as a raised step or threshold. ?Trim any bushes or trees on the path to your home. ?Use bright outdoor lighting. ?Clear any walking paths of anything that might make someone trip, such as rocks or tools. ?Regularly check to see if handrails are loose or broken. Make sure that both sides of any steps have handrails. ?Any raised decks and porches should have guardrails on the edges. ?Have any leaves, snow, or ice cleared regularly. ?Use sand or salt on walking paths during winter. ?Clean up any spills in your garage right away. This includes oil or grease spills. ?What can I do in the bathroom? ?Use night lights. ?Install grab bars by the toilet and in the tub and shower. Do not use towel bars as grab bars. ?Use non-skid mats or decals in the tub or shower. ?If you need to sit down in the shower, use a plastic, non-slip stool. ?Keep the floor dry. Clean up any water that spills on the floor as soon as it happens. ?Remove soap buildup in the tub  or shower regularly. ?Attach bath mats securely with double-sided non-slip rug tape. ?Do not have throw rugs and other things on the floor that can make you trip. ?What can I do in the bedroom? ?Use night lights. ?Make sure that you have a light by your bed that is easy to reach. ?Do not use any sheets or blankets that are too big for your bed. They should not hang down onto the floor. ?Have a firm chair that has side arms. You can use this for support while you get dressed. ?Do not have throw rugs and other things on the floor that can make you trip. ?What can I do in the kitchen? ?Clean up any spills right away. ?Avoid walking on wet floors. ?Keep items that you use a lot in easy-to-reach places. ?If you need to reach something above you, use a strong step stool that has a grab bar. ?Keep electrical cords out of the way. ?Do not use floor polish or wax that makes floors slippery. If you must use wax, use non-skid floor wax. ?Do not have throw rugs and other things on the floor that can  make you trip. ?What can I do with my stairs? ?Do not leave any items on the stairs. ?Make sure that there are handrails on both sides of the stairs and use them. Fix handrails that are broken or loose. Make sure that handrails are as long as the stairways. ?Check any carpeting to make sure that it is firmly attached to the stairs. Fix any carpet that is loose or worn. ?Avoid having throw rugs at the top or bottom of the stairs. If you do have throw rugs, attach them to the floor with carpet tape. ?Make sure that you have a light switch at the top of the stairs and the bottom of the stairs. If you do not have them, ask someone to add them for you. ?What else can I do to help prevent falls? ?Wear shoes that: ?Do not have high heels. ?Have rubber bottoms. ?Are comfortable and fit you well. ?Are closed at the toe. Do not wear sandals. ?If you use a stepladder: ?Make sure that it is fully opened. Do not climb a closed stepladder. ?Make  sure that both sides of the stepladder are locked into place. ?Ask someone to hold it for you, if possible. ?Clearly mark and make sure that you can see: ?Any grab bars or handrails. ?First and last s

## 2021-10-23 NOTE — Progress Notes (Signed)
? ?Subjective:  ? Karen Watson is a 81 y.o. female who presents for Medicare Annual (Subsequent) preventive examination. ? ?Review of Systems    ?Virtual Visit via Telephone Note ? ?I connected with  [name] on 10/23/21 at  1:30 PM EDT by telephone and verified that I am speaking with the correct person using two identifiers. ? ?Location: ?Patient: Home ?Provider: Office ?Persons participating in the virtual visit: patient/Nurse Health Advisor ?  ?I discussed the limitations, risks, security and privacy concerns of performing an evaluation and management service by telephone and the availability of in person appointments. The patient expressed understanding and agreed to proceed. ? ?Interactive audio and video telecommunications were attempted between this nurse and patient, however failed, due to patient having technical difficulties OR patient did not have access to video capability.  We continued and completed visit with audio only. ? ?Some vital signs may be absent or patient reported.  ? ?[name], LPN  ?Cardiac Risk Factors include: advanced age (>34men, >15 women);hypertension ? ?   ?Objective:  ?  ?Today's Vitals  ? 10/23/21 1327  ?Weight: 155 lb (70.3 kg)  ?Height: 5\' 5"  (1.651 m)  ? ?Body mass index is 25.79 kg/m?. ? ? ?  10/23/2021  ?  1:35 PM 04/22/2021  ?  3:42 PM 10/21/2020  ?  1:31 PM 08/12/2020  ?  3:48 PM 01/17/2016  ? 11:04 AM 10/03/2015  ?  9:54 AM 06/08/2012  ?  8:25 PM  ?Advanced Directives  ?Does Patient Have a Medical Advance Directive? Yes Yes Yes Yes Yes Yes   ?Type of Paramedic of [place];Living will [place];Living will [place];Living will Out of facility DNR (pink MOST or yellow form)  [place];Living will   ?Does patient want to make changes to medical advance directive? No - Patient declined No - Patient declined No - Patient declined Yes (ED - Information included in AVS)  No - Patient  declined   ?Copy of [place] in Chart? No - copy requested Yes - validated most recent copy scanned in chart (See row information) No - copy requested  No - copy requested Yes   ?Pre-existing out of facility DNR order (yellow form or pink MOST form)       No  ? ? ?Current Medications (verified) ?Outpatient Encounter Medications as of 10/23/2021  ?Medication Sig  ? acetaminophen (TYLENOL) 500 MG tablet Take 1,000 mg by mouth 3 (three) times daily as needed for mild pain.   ? amLODipine (NORVASC) 5 MG tablet TAKE 1 TABLET BY MOUTH  DAILY  ? b complex vitamins tablet Take 1 tablet by mouth daily.  ? Calcium Carb-Cholecalciferol (CALCIUM 600 + D PO) Take 600 mg by mouth 2 (two) times daily.  ? fish oil-omega-3 fatty acids 1000 MG capsule Take 1 g by mouth daily.  ? gabapentin (NEURONTIN) 300 MG capsule TAKE 1 CAPSULE BY MOUTH 3  TIMES DAILY  ? MAGNESIUM-ZINC PO Take 1 tablet by mouth every other day.   ? meclizine (ANTIVERT) 25 MG tablet Take 1 tablet (25 mg total) by mouth every 4 (four) hours as needed for dizziness.  ? Melatonin 10 MG CAPS Take 10 mg by mouth.  ? MULTIPLE VITAMIN PO Take 1 tablet by mouth daily.  ? potassium chloride SA (KLOR-CON M) 20 MEQ tablet TAKE 1 TABLET BY MOUTH  TWICE DAILY  ? triamcinolone cream (KENALOG) 0.1 % Apply 1  application topically 2 (two) times daily.  ? triamterene-hydrochlorothiazide (MAXZIDE-25) 37.5-25 MG tablet TAKE 1 TABLET BY MOUTH DAILY  ? urea (CARMOL) 20 % cream Apply topically as needed.  ? vitamin C (ASCORBIC ACID) 500 MG tablet Take 500 mg by mouth daily as needed (in winter months).  ? vitamin E 400 UNIT capsule Take 400 Units by mouth daily.  ? ?No facility-administered encounter medications on file as of 10/23/2021.  ? ? ?Allergies (verified) ?Carbocaine [mepivacaine hcl], Codeine, Hydrocodone bit-homatrop mbr, Metrizamide, Propoxyphene n-acetaminophen, Robaxin [methocarbamol], Tape, Tramadol, Vicodin [hydrocodone-acetaminophen], Cefazolin, Iodinated  contrast media, Penicillins, Amoxil [amoxicillin], and Nsaids  ? ?History: ?Past Medical History:  ?Diagnosis Date  ? Arthritis   ? "thumbs, neck, shoulders, lower back" (06/08/2012)  ? Basal cell carcinoma 1995-present  ? "face and back" (06/08/2012)  ? Chronic bronchitis ([place])   ? last winter  ? Chronic kidney disease   ? states she is stage 3   ? Complication of anesthesia 01/2003  ? developed Hives, vomiting, SOB, chest heaviness after gallbladder surgery was told they believe it was due to either ancef or carbocaine  ? Constipation   ? Diarrhea   ? DJD (degenerative joint disease)   ? see Dr. Charlestine Night  ? Family history of anesthesia complication   ? daughter had surgery that didn't require vent but she ended up on one but pt has no idea why.  ? Headache(784.0)   ? Dr. Erling Cruz; "frequent; w/weather changes" (06/08/2012)  ? History of shingles   ? Hypertension   ? takes Amlodipine and Maxzide daily  ? Insomnia   ? not on any meds  ? Joint pain   ? Joint swelling   ? Lung cancer, lower lobe ([place]) 08/1997  ? "lower right lobe" (06/08/2012)  ? 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 "right; inactive since 1999" (06/08/2012)  ? Lung nodules   ? multpile  ? Memory loss   ? Nocturia   ? Pneumonia   ? hx of about 5 yrs ago  ? PONV (postoperative nausea and vomiting)   ? Restless leg   ? takes tonic water at bedtime  ? Restless leg   ? Vertigo   ? doesn't take any meds  ? Weakness   ? numbness and tingling right leg  ? ?Past Surgical History:  ?Procedure Laterality Date  ? ANTERIOR LAT LUMBAR FUSION  06/08/2012  ? L2-3; L3-4  ? ANTERIOR LAT LUMBAR FUSION  06/08/2012  ? Procedure: ANTERIOR LATERAL LUMBAR FUSION 2 LEVELS;  Surgeon: Eustace Moore, MD;  Location: [facility];  Service: Neurosurgery;  Laterality: Right;  Right Lumbar two-three, lumbar three-four extreme lateral interbody fusion with percutaneous pedicle screws  ? BASAL CELL CARCINOMA EXCISION  1995-2011  ? "off back; @ least a couple/yr; Dr. Tonia Brooms" (06/08/2012)   ? BREAST BIOPSY  07/2013  ? left breast  ? CATARACT EXTRACTION W/ INTRAOCULAR LENS  IMPLANT, BILATERAL  2011  ? per Dr. Ayesha Rumpf in [place]  ? CHOLECYSTECTOMY  01/2003  ? colonscopy  08-03-13  ? per Dr. Hilarie Fredrickson, clear, no repeats needed   ? dexa  01/2009  ? normal  ? DILATION AND CURETTAGE OF UTERUS  2000's  ? ESOPHAGOGASTRODUODENOSCOPY    ? LUMBAR LAMINECTOMY/DECOMPRESSION MICRODISCECTOMY Right 10/09/2015  ? Procedure: Right Lumbar Five-Sacral One Extraforaminal Foraminotomy with removal of synovial cyst;  Surgeon: Eustace Moore, MD;  Location: [facility];  Service: Neurosurgery;  Laterality: Right;  ? LUMBAR PERCUTANEOUS PEDICLE SCREW 1 LEVEL  06/08/2012  ? Procedure: LUMBAR PERCUTANEOUS PEDICLE SCREW 1 LEVEL;  Surgeon: Eustace Moore, MD;  Location: [facility];  Service: Neurosurgery;  Laterality: Right;  Right Lumbar two-three, lumbar three-four extreme lateral interbody fusion with percutaneous pedicle screws  ? LUNG LOBECTOMY  08/1997  ? RLL "for carcinoid" (06/08/2012)  ? TONSILLECTOMY  1947  ? TOTAL HIP ARTHROPLASTY Right 01/28/2016  ? Procedure: RIGHT TOTAL HIP ARTHROPLASTY ANTERIOR APPROACH;  Surgeon: Renette Butters, MD;  Location: [facility];  Service: Orthopedics;  Laterality: Right;  ? TUBAL LIGATION  ~ 1976  ? ?Family History  ?Problem Relation Age of Onset  ? Multiple sclerosis Brother   ? Asthma Other   ? Coronary artery disease Other   ? Diabetes Other   ? Hypertension Other   ? Melanoma Other   ? Osteoporosis Other   ? Uterine cancer Other   ? Kidney cancer Other   ? Colon cancer Neg Hx   ? ?Social History  ? ?Socioeconomic History  ? Marital status: Married  ?  Spouse name: Not on file  ? Number of children: Not on file  ? Years of education: Not on file  ? Highest education level: Not on file  ?Occupational History  ? Not on file  ?Tobacco Use  ? Smoking status: Never  ? Smokeless tobacco: Never  ?Substance and Sexual Activity  ? Alcohol use: No  ?  Alcohol/week: 0.0 standard drinks  ? Drug use: No  ?  Sexual activity: Yes  ?Other Topics Concern  ? Not on file  ?Social History Narrative  ? Married  ?   ?   ? ?Social Determinants of Health  ? ?Financial Resource Strain: Low Risk   ? Difficulty of Pay

## 2021-10-29 DIAGNOSIS — L570 Actinic keratosis: Secondary | ICD-10-CM | POA: Diagnosis not present

## 2021-11-06 DIAGNOSIS — H16223 Keratoconjunctivitis sicca, not specified as Sjogren's, bilateral: Secondary | ICD-10-CM | POA: Diagnosis not present

## 2021-11-24 ENCOUNTER — Telehealth: Payer: Self-pay | Admitting: Family Medicine

## 2021-11-24 DIAGNOSIS — R300 Dysuria: Secondary | ICD-10-CM

## 2021-11-24 NOTE — Telephone Encounter (Signed)
Pt call and stated she need a RX on

## 2021-11-24 NOTE — Telephone Encounter (Signed)
Pt call and stated she need a refill on sulfamethoxazole-trimethoprim (BACTRIM DS) 800-160 MG tablet  sent to  [id number] [address], [place] Phone:  [phone]  Fax:  [phone]

## 2021-11-24 NOTE — Telephone Encounter (Signed)
We need more information. What does she want this for?

## 2021-11-24 NOTE — Telephone Encounter (Signed)
Last OV- 10/07/2021 Prescription no longer on med list.   Expired.  No future OV scheduled. Can this patient receive a refill?

## 2021-11-24 NOTE — Telephone Encounter (Signed)
Called and spoke with patient.  Since yesterday morning, patient has experiencing frequent urination, burning, cramping, and blood in her urine.    She stated each time she gets a UTI it appears very quickly.     Patient's husband has an appointment on 11/25/21 at 9:45 am.      She has requested if she could drop a urine sample off at that time?

## 2021-11-25 ENCOUNTER — Other Ambulatory Visit (INDEPENDENT_AMBULATORY_CARE_PROVIDER_SITE_OTHER): Payer: Medicare Other

## 2021-11-25 ENCOUNTER — Encounter: Payer: Self-pay | Admitting: Family Medicine

## 2021-11-25 ENCOUNTER — Ambulatory Visit (INDEPENDENT_AMBULATORY_CARE_PROVIDER_SITE_OTHER): Payer: Medicare Other | Admitting: Family Medicine

## 2021-11-25 VITALS — BP 124/62 | HR 64 | Temp 98.0°F

## 2021-11-25 DIAGNOSIS — R300 Dysuria: Secondary | ICD-10-CM

## 2021-11-25 DIAGNOSIS — N390 Urinary tract infection, site not specified: Secondary | ICD-10-CM

## 2021-11-25 LAB — POC URINALSYSI DIPSTICK (AUTOMATED)
Bilirubin, UA: NEGATIVE
Glucose, UA: NEGATIVE
Ketones, UA: NEGATIVE
Nitrite, UA: NEGATIVE
Protein, UA: POSITIVE — AB
Spec Grav, UA: 1.025 (ref 1.010–1.025)
Urobilinogen, UA: 0.2 E.U./dL
pH, UA: 6 (ref 5.0–8.0)

## 2021-11-25 MED ORDER — SULFAMETHOXAZOLE-TRIMETHOPRIM 800-160 MG PO TABS: 1.0000 | ORAL_TABLET | Freq: Two times a day (BID) | ORAL | 0 refills | Status: DC

## 2021-11-25 NOTE — Progress Notes (Signed)
   Subjective:    Patient ID: Karen Watson, female    DOB: 03/11/1941, 81 y.o.   MRN: [id number]  HPI Here for 3 days of urinary urgency and burning. No fever.    Review of Systems  Constitutional: Negative.   Respiratory: Negative.    Cardiovascular: Negative.   Genitourinary:  Positive for dysuria, frequency and urgency. Negative for hematuria and pelvic pain.      Objective:   Physical Exam Constitutional:      Appearance: Normal appearance.  Cardiovascular:     Rate and Rhythm: Normal rate and regular rhythm.     Pulses: Normal pulses.     Heart sounds: Normal heart sounds.  Pulmonary:     Effort: Pulmonary effort is normal.     Breath sounds: Normal breath sounds.  Abdominal:     General: Abdomen is flat. Bowel sounds are normal. There is no distension.     Palpations: Abdomen is soft. There is no mass.     Tenderness: There is no abdominal tenderness. There is no right CVA tenderness, left CVA tenderness, guarding or rebound.     Hernia: No hernia is present.  Neurological:     Mental Status: She is alert.          Assessment & Plan:  Recurrent UTI, treat with 7 days of Bactrim DS. Culture the sample. Refer to Urology. Alysia Penna, MD

## 2021-11-25 NOTE — Telephone Encounter (Signed)
She will give [place] a sample today for a UA

## 2021-11-28 LAB — URINE CULTURE
MICRO NUMBER:: 13489776
SPECIMEN QUALITY:: ADEQUATE

## 2021-12-03 ENCOUNTER — Other Ambulatory Visit: Payer: Self-pay | Admitting: Family Medicine

## 2021-12-03 DIAGNOSIS — I10 Essential (primary) hypertension: Secondary | ICD-10-CM

## 2021-12-04 ENCOUNTER — Other Ambulatory Visit: Payer: Self-pay | Admitting: Family Medicine

## 2021-12-04 DIAGNOSIS — I10 Essential (primary) hypertension: Secondary | ICD-10-CM

## 2021-12-09 ENCOUNTER — Ambulatory Visit: Payer: Self-pay

## 2021-12-09 DIAGNOSIS — M159 Polyosteoarthritis, unspecified: Secondary | ICD-10-CM

## 2021-12-09 DIAGNOSIS — I10 Essential (primary) hypertension: Secondary | ICD-10-CM

## 2021-12-09 NOTE — Patient Instructions (Signed)
Visit Information  Thank you for allowing me to share the care management and care coordination services that are available to you as part of your health plan and services through your primary care provider and medical home. Please reach out to me at [phone] if the care management/care coordination team may be of assistance to you in the future.   Peter Garter RN, Jackquline Denmark, CDE Care Management Coordinator [facility] [phone]

## 2021-12-09 NOTE — Chronic Care Management (AMB) (Signed)
Chronic Care Management   CCM RN Visit Note  12/09/2021 Name: Karen Watson MRN: [id number] DOB: 06-02-1941  Subjective: Karen Watson is a 81 y.o. year old female who is a primary care patient of Laurey Morale, MD. The care management team was consulted for assistance with disease management and care coordination needs.    Engaged with patient by telephone for follow up visit in response to provider referral for case management and/or care coordination services.   Consent to Services:  The patient was given information about Chronic Care Management services, agreed to services, and gave verbal consent prior to initiation of services.  Please see initial visit note for detailed documentation.   Patient agreed to services and verbal consent obtained.   Assessment: Review of patient past medical history, allergies, medications, health status, including review of consultants reports, laboratory and other test data, was performed as part of comprehensive evaluation and provision of chronic care management services.   SDOH (Social Determinants of Health) assessments and interventions performed:    CCM Care Plan  Allergies  Allergen Reactions   Carbocaine [Mepivacaine Hcl] Hives and Nausea And Vomiting   Codeine Anaphylaxis, Shortness Of Breath, Nausea And Vomiting and Other (See Comments)    Respiratory distress; Low blood pressure. Pt tolerates percocet.   Hydrocodone Bit-Homatrop Mbr Anaphylaxis, Nausea And Vomiting and Shortness Of Breath    Dangerously low blood pressure, respiratory distress; heaviness in chest   Metrizamide Hives    Pre-medicated with Benadryl 61m PO   Propoxyphene N-Acetaminophen Anaphylaxis and Nausea And Vomiting    "Darvocet" t; Lightheadedness, confusion   Robaxin [Methocarbamol] Other (See Comments)    "severe Altzheimer's symptoms"   Tape Rash and Other (See Comments)    Surgical tape causes rash and big blisters   Tramadol Other (See Comments)     Low respiratory rate, nausea, chest heaviness   Vicodin [Hydrocodone-Acetaminophen] Anaphylaxis   Cefazolin Hives, Nausea And Vomiting and Other (See Comments)    "Ancef"; Also, severe stomach pain   Iodinated Contrast Media Hives    Pre-medicated with Benadryl 516mPO   Penicillins Hives, Nausea And Vomiting and Other (See Comments)    severe stomach pain, Has patient had a PCN reaction causing immediate rash, facial/tongue/throat swelling, SOB or lightheadedness with hypotension: Yes Has patient had a PCN reaction causing severe rash involving mucus membranes or skin necrosis: No Has patient had a PCN reaction that required hospitalization No Has patient had a PCN reaction occurring within the last 10 years: Yes If all of the above answers are "NO", then may proceed with Cephalosporin use.    Amoxil [Amoxicillin]     Itching and rash over body, N & V, itchy scalp    Nsaids Other (See Comments)    Bruising; use with caution    Outpatient Encounter Medications as of 12/09/2021  Medication Sig   acetaminophen (TYLENOL) 500 MG tablet Take 1,000 mg by mouth 3 (three) times daily as needed for mild pain.    amLODipine (NORVASC) 5 MG tablet TAKE 1 TABLET BY MOUTH DAILY   b complex vitamins tablet Take 1 tablet by mouth daily.   Calcium Carb-Cholecalciferol (CALCIUM 600 + D PO) Take 600 mg by mouth 2 (two) times daily.   fish oil-omega-3 fatty acids 1000 MG capsule Take 1 g by mouth daily.   gabapentin (NEURONTIN) 300 MG capsule TAKE 1 CAPSULE BY MOUTH 3 TIMES  DAILY   MAGNESIUM-ZINC PO Take 1 tablet by mouth every other day.  meclizine (ANTIVERT) 25 MG tablet Take 1 tablet (25 mg total) by mouth every 4 (four) hours as needed for dizziness.   Melatonin 10 MG CAPS Take 10 mg by mouth.   MULTIPLE VITAMIN PO Take 1 tablet by mouth daily.   potassium chloride SA (KLOR-CON M) 20 MEQ tablet TAKE 1 TABLET BY MOUTH TWICE  DAILY   sulfamethoxazole-trimethoprim (BACTRIM DS) 800-160 MG tablet Take 1  tablet by mouth 2 (two) times daily.   triamcinolone cream (KENALOG) 0.1 % Apply 1 application topically 2 (two) times daily.   triamterene-hydrochlorothiazide (MAXZIDE-25) 37.5-25 MG tablet TAKE 1 TABLET BY MOUTH DAILY   urea (CARMOL) 20 % cream Apply topically as needed.   vitamin C (ASCORBIC ACID) 500 MG tablet Take 500 mg by mouth daily as needed (in winter months).   vitamin E 400 UNIT capsule Take 400 Units by mouth daily.   No facility-administered encounter medications on file as of 12/09/2021.    Patient Active Problem List   Diagnosis Date Noted   Gastroesophageal reflux disease 08/19/2020   COVID-19 virus infection 08/19/2020   Depression with anxiety 01/21/2018   Chronic venous insufficiency 04/29/2016   Insomnia 02/12/2016   Primary localized osteoarthritis of right hip 01/28/2016   Primary osteoarthritis of right hip 01/07/2016   S/P lumbar laminectomy 10/09/2015   Osteoporosis 07/04/2015   Arthropathy 04/30/2008   NECK PAIN 10/21/2007   Osteoarthritis 06/22/2007   LEG CRAMPS 06/22/2007   Essential hypertension 03/03/2007   LOW BACK PAIN 03/03/2007   HEADACHE 03/03/2007    Conditions to be addressed/monitored:HTN, HLD, and Osteoarthritis  Care Plan : RN Care Manager Plan of Care  Updates made by Dimitri Ped, RN since 12/09/2021 12:00 AM  Completed 12/09/2021   Problem: Chronic Disease Management and Care Coordination Needs (HTN,osteoarthritis and HLD) Resolved 12/09/2021  Priority: High     Long-Range Goal: Establish Plan of Care for Chronic Disease Management Needs (HTN, Osteoarthris and HLD) Completed 12/09/2021  Start Date: 04/22/2021  Expected End Date: 10/19/2021  Recent Progress: On track  Priority: High  Note:   Case closed goals met Current Barriers:  Chronic Disease Management support and education needs related to HTN, HLD, and Osteoarthritis Pt states that she has not  been checking her B/P at home regularly but it was 133/78 the last time she  checked it.  States her lower back pain varies depending on her activity but it has not been bothering her very much recently.  States she cares for her husband who has Alzheimer's.  States he is  not difficult to care for at this time.  Denies needing LCSW for caregiver stress.  States her children help if needed.  States she and her husband continue to play golf 3 times a week for exercise.  States she tries to eat healthy and cooks at home most of the time  [facility] Clinical Goal(s):  Patient will verbalize understanding of plan for management of HTN, HLD, and Osteoarthritis as evidenced by voiced adherence to plan of care verbalize basic understanding of  HTN, HLD, and Osteoarthritis disease process and self health management plan as evidenced by voiced understanding and teach back take all medications exactly as prescribed and will call provider for medication related questions as evidenced by dispense report and pt verbalization attend all scheduled medical appointments: no scheduled provider visit scheduled as evidenced by medical records demonstrate Ongoing adherence to prescribed treatment plan for HTN, HLD, and Osteoarthritis as evidenced by readings within limits and adherence to  plan of care continue to work with RN Care Manager to address care management and care coordination needs related to  HTN, HLD, and Osteoarthritis as evidenced by adherence to CM Team Scheduled appointments through collaboration with RN Care manager, provider, and care team.   Interventions: 1:1 collaboration with primary care provider regarding development and update of comprehensive plan of care as evidenced by provider attestation and co-signature Inter-disciplinary care team collaboration (see longitudinal plan of care) Evaluation of current treatment plan related to  self management and patient's adherence to plan as established by provider  Pain Interventions:  (Status:  Goal Met.) Long Term Goal Pain assessment  performed Medications reviewed Reviewed provider established plan for pain management Discussed importance of adherence to all scheduled medical appointments Advised patient to report to care team affect of pain on daily activities Discussed use of relaxation techniques and/or diversional activities to assist with pain reduction (distraction, imagery, relaxation, massage, acupressure, TENS, heat, and cold application Reinforced to keep active to help with arthritis    Hyperlipidemia Interventions:  (Status:  Goal Met.) Long Term Goal Medication review performed; medication list updated in electronic medical record.  Provider established cholesterol goals reviewed Counseled on importance of regular laboratory monitoring as prescribed Reviewed importance of limiting foods high in cholesterol Reviewed exercise goals and target of 150 minutes per week Reinforced ways to lower lipids without taking a statin  Hypertension Interventions:  (Status:  Goal Met.) Long Term Goal Last practice recorded BP readings:  BP Readings from Last 3 Encounters:  05/27/21 130/78  03/03/21 122/78  10/21/20 (!) 162/72  Most recent eGFR/CrCl: No results found for: EGFR  No components found for: CRCL  Evaluation of current treatment plan related to hypertension self management and patient's adherence to plan as established by provider Provided education to patient re: stroke prevention, s/s of heart attack and stroke Counseled on the importance of exercise goals with target of 150 minutes per week Advised patient, providing education and rationale, to monitor blood pressure daily and record, calling PCP for findings outside established parameters Provided education on prescribed diet low sodium low fat Reviewed to try to check her B/P at home at least weekly    Patient Goals/Self-Care Activities: Take all medications as prescribed Attend all scheduled provider appointments Call pharmacy for medication refills  3-7 days in advance of running out of medications Perform all self care activities independently  Perform IADL's (shopping, preparing meals, housekeeping, managing finances) independently Call provider office for new concerns or questions  check blood pressure weekly choose a place to take my blood pressure (home, clinic or office, retail store) take blood pressure log to all doctor appointments keep all doctor appointments eat more whole grains, fruits and vegetables, lean meats and healthy fats limit salt intake to 2359m/day adhere to prescribed diet: low sodium low fat develop an exercise routine  Follow Up Plan:  The patient has been provided with contact information for the care management team and has been advised to call with any health related questions or concerns.  No further follow up required: Case closed goals met       Plan:The patient has been provided with contact information for the care management team and has been advised to call with any health related questions or concerns.  No further follow up required: Case closed goals met MPeter GarterRN, [facility] CDE Care Management Coordinator [facility] [phone]

## 2021-12-25 ENCOUNTER — Telehealth: Payer: Medicare Other

## 2022-01-22 ENCOUNTER — Telehealth: Payer: Self-pay | Admitting: Family Medicine

## 2022-01-22 NOTE — Telephone Encounter (Signed)
Pt is still having UTI issues and would like to know if MD can send her this again:  sulfamethoxazole-trimethoprim (BACTRIM DS) 800-160 MG tablet  Pt states this is the only thing that is working for her.   [id number] [address], [place] Phone:  [phone]  Fax:  [phone]

## 2022-01-23 ENCOUNTER — Other Ambulatory Visit: Payer: Self-pay

## 2022-01-23 MED ORDER — SULFAMETHOXAZOLE-TRIMETHOPRIM 800-160 MG PO TABS: 1.0000 | ORAL_TABLET | Freq: Two times a day (BID) | ORAL | 0 refills | Status: DC

## 2022-01-23 NOTE — Telephone Encounter (Signed)
Call in Bactrim DS BID for 7 days

## 2022-01-23 NOTE — Telephone Encounter (Signed)
Spoke with pt notified of new Rx for Bactrim DS was sent to her pharmacy, verbalized understanding

## 2022-01-26 DIAGNOSIS — Z87440 Personal history of urinary (tract) infections: Secondary | ICD-10-CM | POA: Diagnosis not present

## 2022-01-27 ENCOUNTER — Other Ambulatory Visit: Payer: Self-pay | Admitting: Family Medicine

## 2022-01-27 DIAGNOSIS — I10 Essential (primary) hypertension: Secondary | ICD-10-CM

## 2022-03-03 DIAGNOSIS — Z129 Encounter for screening for malignant neoplasm, site unspecified: Secondary | ICD-10-CM | POA: Diagnosis not present

## 2022-03-03 DIAGNOSIS — Z85828 Personal history of other malignant neoplasm of skin: Secondary | ICD-10-CM | POA: Diagnosis not present

## 2022-03-03 DIAGNOSIS — L718 Other rosacea: Secondary | ICD-10-CM | POA: Diagnosis not present

## 2022-03-03 DIAGNOSIS — L570 Actinic keratosis: Secondary | ICD-10-CM | POA: Diagnosis not present

## 2022-03-03 DIAGNOSIS — L821 Other seborrheic keratosis: Secondary | ICD-10-CM | POA: Diagnosis not present

## 2022-03-03 DIAGNOSIS — L820 Inflamed seborrheic keratosis: Secondary | ICD-10-CM | POA: Diagnosis not present

## 2022-04-13 ENCOUNTER — Ambulatory Visit (INDEPENDENT_AMBULATORY_CARE_PROVIDER_SITE_OTHER): Payer: Medicare Other | Admitting: *Deleted

## 2022-04-13 DIAGNOSIS — Z23 Encounter for immunization: Secondary | ICD-10-CM

## 2022-04-20 ENCOUNTER — Other Ambulatory Visit: Payer: Self-pay | Admitting: Family Medicine

## 2022-04-20 DIAGNOSIS — I10 Essential (primary) hypertension: Secondary | ICD-10-CM

## 2022-04-21 ENCOUNTER — Other Ambulatory Visit: Payer: Self-pay | Admitting: Family Medicine

## 2022-06-05 ENCOUNTER — Ambulatory Visit (INDEPENDENT_AMBULATORY_CARE_PROVIDER_SITE_OTHER): Payer: Medicare Other | Admitting: Family Medicine

## 2022-06-05 ENCOUNTER — Encounter: Payer: Self-pay | Admitting: Family Medicine

## 2022-06-05 VITALS — BP 120/76 | HR 61 | Temp 98.1°F | Ht 65.0 in | Wt 160.0 lb

## 2022-06-05 DIAGNOSIS — I872 Venous insufficiency (chronic) (peripheral): Secondary | ICD-10-CM

## 2022-06-05 DIAGNOSIS — K219 Gastro-esophageal reflux disease without esophagitis: Secondary | ICD-10-CM

## 2022-06-05 DIAGNOSIS — R739 Hyperglycemia, unspecified: Secondary | ICD-10-CM

## 2022-06-05 DIAGNOSIS — F418 Other specified anxiety disorders: Secondary | ICD-10-CM | POA: Diagnosis not present

## 2022-06-05 DIAGNOSIS — I10 Essential (primary) hypertension: Secondary | ICD-10-CM

## 2022-06-05 DIAGNOSIS — F5101 Primary insomnia: Secondary | ICD-10-CM | POA: Diagnosis not present

## 2022-06-05 DIAGNOSIS — M810 Age-related osteoporosis without current pathological fracture: Secondary | ICD-10-CM

## 2022-06-05 DIAGNOSIS — M542 Cervicalgia: Secondary | ICD-10-CM

## 2022-06-05 DIAGNOSIS — G8929 Other chronic pain: Secondary | ICD-10-CM | POA: Diagnosis not present

## 2022-06-05 DIAGNOSIS — M5440 Lumbago with sciatica, unspecified side: Secondary | ICD-10-CM

## 2022-06-05 DIAGNOSIS — M159 Polyosteoarthritis, unspecified: Secondary | ICD-10-CM | POA: Diagnosis not present

## 2022-06-05 LAB — CBC WITH DIFFERENTIAL/PLATELET
Basophils Absolute: 0 10*3/uL (ref 0.0–0.1)
Basophils Relative: 0.7 % (ref 0.0–3.0)
Eosinophils Absolute: 0.1 10*3/uL (ref 0.0–0.7)
Eosinophils Relative: 1.6 % (ref 0.0–5.0)
HCT: 44.4 % (ref 36.0–46.0)
Hemoglobin: 14.8 g/dL (ref 12.0–15.0)
Lymphocytes Relative: 34.1 % (ref 12.0–46.0)
Lymphs Abs: 2.3 10*3/uL (ref 0.7–4.0)
MCHC: 33.2 g/dL (ref 30.0–36.0)
MCV: 93.5 fl (ref 78.0–100.0)
Monocytes Absolute: 0.5 10*3/uL (ref 0.1–1.0)
Monocytes Relative: 7.7 % (ref 3.0–12.0)
Neutro Abs: 3.7 10*3/uL (ref 1.4–7.7)
Neutrophils Relative %: 55.9 % (ref 43.0–77.0)
Platelets: 274 10*3/uL (ref 150.0–400.0)
RBC: 4.75 Mil/uL (ref 3.87–5.11)
RDW: 14.3 % (ref 11.5–15.5)
WBC: 6.6 10*3/uL (ref 4.0–10.5)

## 2022-06-05 LAB — BASIC METABOLIC PANEL
BUN: 16 mg/dL (ref 6–23)
CO2: 33 mEq/L — ABNORMAL HIGH (ref 19–32)
Calcium: 10.3 mg/dL (ref 8.4–10.5)
Chloride: 102 mEq/L (ref 96–112)
Creatinine, Ser: 0.97 mg/dL (ref 0.40–1.20)
GFR: 55.24 mL/min — ABNORMAL LOW (ref >60.00)
Glucose, Bld: 100 mg/dL — ABNORMAL HIGH (ref 70–99)
Potassium: 3.8 mEq/L (ref 3.5–5.1)
Sodium: 142 mEq/L (ref 135–145)

## 2022-06-05 LAB — LIPID PANEL
Cholesterol: 184 mg/dL (ref 0–200)
HDL: 47.1 mg/dL (ref >39.00)
LDL Cholesterol: 113 mg/dL — ABNORMAL HIGH (ref 0–99)
NonHDL: 137.16
Total CHOL/HDL Ratio: 4
Triglycerides: 119 mg/dL (ref 0.0–149.0)
VLDL: 23.8 mg/dL (ref 0.0–40.0)

## 2022-06-05 LAB — HEMOGLOBIN A1C: Hgb A1c MFr Bld: 6.3 % (ref 4.6–6.5)

## 2022-06-05 LAB — HEPATIC FUNCTION PANEL
ALT: 18 U/L (ref 0–35)
AST: 22 U/L (ref 0–37)
Albumin: 4.6 g/dL (ref 3.5–5.2)
Alkaline Phosphatase: 64 U/L (ref 39–117)
Bilirubin, Direct: 0.1 mg/dL (ref 0.0–0.3)
Total Bilirubin: 0.6 mg/dL (ref 0.2–1.2)
Total Protein: 7.6 g/dL (ref 6.0–8.3)

## 2022-06-05 LAB — TSH: TSH: 1.18 u[IU]/mL (ref 0.35–5.50)

## 2022-06-05 NOTE — Progress Notes (Signed)
   Subjective:    Patient ID: Karen Watson, female    DOB: [birth year], 81 y.o.   MRN: [id number]  HPI Here to follow up on issues. She has no complaints today. Her depression and anxiety are stable. Her GERD and HTN are stable. Her OA has been well controlled.    Review of Systems  Constitutional: Negative.   HENT: Negative.    Eyes: Negative.   Respiratory: Negative.    Cardiovascular: Negative.   Gastrointestinal: Negative.   Genitourinary:  Negative for decreased urine volume, difficulty urinating, dyspareunia, dysuria, enuresis, flank pain, frequency, hematuria, pelvic pain and urgency.  Musculoskeletal:  Positive for arthralgias and back pain.  Skin: Negative.   Neurological: Negative.  Negative for headaches.  Psychiatric/Behavioral: Negative.         Objective:   Physical Exam Constitutional:      General: She is not in acute distress.    Appearance: Normal appearance. She is well-developed.  HENT:     Head: Normocephalic and atraumatic.     Right Ear: External ear normal.     Left Ear: External ear normal.     Nose: Nose normal.     Mouth/Throat:     Pharynx: No oropharyngeal exudate.  Eyes:     General: No scleral icterus.    Conjunctiva/sclera: Conjunctivae normal.     Pupils: Pupils are equal, round, and reactive to light.  Neck:     Thyroid: No thyromegaly.     Vascular: No JVD.  Cardiovascular:     Rate and Rhythm: Normal rate and regular rhythm.     Heart sounds: Normal heart sounds. No murmur heard.    No friction rub. No gallop.  Pulmonary:     Effort: Pulmonary effort is normal. No respiratory distress.     Breath sounds: Normal breath sounds. No wheezing or rales.  Chest:     Chest wall: No tenderness.  Abdominal:     General: Bowel sounds are normal. There is no distension.     Palpations: Abdomen is soft. There is no mass.     Tenderness: There is no abdominal tenderness. There is no guarding or rebound.  Musculoskeletal:        General: No  tenderness. Normal range of motion.     Cervical back: Normal range of motion and neck supple.  Lymphadenopathy:     Cervical: No cervical adenopathy.  Skin:    General: Skin is warm and dry.     Findings: No erythema or rash.  Neurological:     Mental Status: She is alert and oriented to person, place, and time.     Cranial Nerves: No cranial nerve deficit.     Motor: No abnormal muscle tone.     Coordination: Coordination normal.     Deep Tendon Reflexes: Reflexes are normal and symmetric. Reflexes normal.  Psychiatric:        Behavior: Behavior normal.        Thought Content: Thought content normal.        Judgment: Judgment normal.           Assessment & Plan:  She is doing well with depression and anxiety. Her GERD and OA and HTN are stable. Get fasting labs to check lipids, etc. We spent a total of ( 31  ) minutes reviewing records and discussing these issues.  Alysia Penna, MD

## 2022-06-22 ENCOUNTER — Other Ambulatory Visit: Payer: Self-pay | Admitting: Family Medicine

## 2022-06-22 DIAGNOSIS — I10 Essential (primary) hypertension: Secondary | ICD-10-CM

## 2022-07-17 ENCOUNTER — Telehealth: Payer: Self-pay | Admitting: Family Medicine

## 2022-07-17 ENCOUNTER — Encounter: Payer: Self-pay | Admitting: Family Medicine

## 2022-07-17 NOTE — Telephone Encounter (Signed)
Patient dropped off paperwork needed to be filled out. Charge form was completed and placed in folder for completion and patient was made aware Dr.Fry was out of the office today.       Please advise

## 2022-07-20 DIAGNOSIS — Z86012 Personal history of benign carcinoid tumor: Secondary | ICD-10-CM | POA: Diagnosis not present

## 2022-07-20 DIAGNOSIS — Z08 Encounter for follow-up examination after completed treatment for malignant neoplasm: Secondary | ICD-10-CM | POA: Diagnosis not present

## 2022-07-20 NOTE — Telephone Encounter (Signed)
Pt paperwork received and placed on Dr Sarajane Jews red folder

## 2022-07-22 DIAGNOSIS — Z0279 Encounter for issue of other medical certificate: Secondary | ICD-10-CM

## 2022-07-22 NOTE — Telephone Encounter (Signed)
Pt forms have been completed and fax to the number provided, pt is aware

## 2022-10-20 ENCOUNTER — Telehealth: Payer: Self-pay | Admitting: Family Medicine

## 2022-10-20 NOTE — Telephone Encounter (Signed)
Contacted Karen Watson to schedule their annual wellness visit. Appointment made for 10/27/22.  Karen Watson AWV direct phone # [phone]  Due to schedule change moved 10/27/22 awv to [employer] schedule

## 2022-10-27 ENCOUNTER — Ambulatory Visit (INDEPENDENT_AMBULATORY_CARE_PROVIDER_SITE_OTHER): Payer: Medicare Other | Admitting: Family Medicine

## 2022-10-27 DIAGNOSIS — Z0000 Encounter for general adult medical examination without abnormal findings: Secondary | ICD-10-CM

## 2022-10-27 NOTE — Progress Notes (Signed)
PATIENT CHECK-IN and HEALTH RISK ASSESSMENT QUESTIONNAIRE:  -completed by phone/video for upcoming Medicare Preventive Visit  Pre-Visit Check-in: 1)Vitals (height, wt, BP, etc) - record in vitals section for visit on day of visit 2)Review and Update Medications, Allergies PMH, Surgeries, Social history in Epic 3)Hospitalizations in the last year with date/reason?  no  4)Review and Update Care Team (patient's specialists) in Epic 5) Complete PHQ9 in Epic  6) Complete Fall Screening in Epic 7)Review all Health Maintenance Due and order under PCP if not done.  8)Medicare Wellness Questionnaire: Answer theses question about your habits: Do you drink alcohol?  No   Have you ever smoked? No   Do you use an illicit drugs? No  Do you exercises? Y IF so, what type and how many days/minutes per week? Few days a week - using a fitness center, balance class, swimming aerobics and tai chi Are you sexually active?  Reports gets lots of fruits and veggies Typical breakfast cereal or bagel or egg Typical lunch veggs, meat, or chicken pot pit - lunch is big meal. Typical dinner: n/a Typical snacks:fruit, trail mix   Beverages: water with no lemon, cranberry juice   Answer theses question about you: Can you perform most household chores? yes Do you find it hard to follow a conversation in a noisy room? No  Do you often ask people to speak up or repeat themselves? No  Do you feel that you have a problem with memory? No  Do you balance your checkbook and or bank acounts? yes Do you feel safe at home? yes Last dentist visit? 2.5 yrs ago Do you need assistance with any of the following: Please note if so  no   Driving?  Feeding yourself?  Getting from bed to chair?  Getting to the toilet?  Bathing or showering?  Dressing yourself?  Managing money?  Climbing a flight of stairs  Preparing meals?  Do you have Advanced Directives in place (Living Will, Healthcare Power or Attorney)?   yes   Last eye Exam and location? Next one is in a about 1-2 weeks away   Do you currently use prescribed or non-prescribed narcotic or opioid pain medications? No   Do you have a history or close family history of breast, ovarian, tubal or peritoneal cancer or a family member with BRCA (breast cancer susceptibility 1 and 2) gene mutations? No   Nurse/Assistant Credentials/time stamp: T.Thurmond Butts  -CMA 9:29 AM  ----------------------------------------------------------------------------------------------------------------------------------------------------------------------------------------------------------------------   MEDICARE ANNUAL PREVENTIVE VISIT WITH PROVIDER: (Welcome to Medicare, initial annual wellness or annual wellness exam)  Virtual Visit via Phone Note  I connected with CALEIGH BELLEFEUILLE on 10/27/22 by phone and verified that I am speaking with the correct person using two identifiers.  Location patient: home Location provider:work or home office Persons participating in the virtual visit: patient, provider  Concerns and/or follow up today: stable, doing well.    See HM section in Epic for other details of completed HM.    ROS: negative for report of fevers, unintentional weight loss, vision changes, vision loss, hearing loss or change, chest pain, sob, hemoptysis, melena, hematochezia, hematuria, falls, bleeding or bruising, thoughts of suicide or self harm, memory loss  Patient-completed extensive health risk assessment - reviewed and discussed with the patient: See Health Risk Assessment completed with patient prior to the visit either above or in recent phone note. This was reviewed in detailed with the patient today and appropriate recommendations, orders and referrals were placed as needed per Summary  below and patient instructions.   Review of Medical History: -PMH, PSH, Family History and current specialty and care providers reviewed and updated and listed  below   Patient Care Team: Nelwyn Salisbury, MD as PCP - General   Past Medical History:  Diagnosis Date   Arthritis    "thumbs, neck, shoulders, lower back" (06/08/2012)   Basal cell carcinoma 1995-present   "face and back" (06/08/2012)   Chronic bronchitis (HCC)    last winter   Chronic kidney disease    states she is stage 3    Complication of anesthesia 01/2003   developed Hives, vomiting, SOB, chest heaviness after gallbladder surgery was told they believe it was due to either ancef or carbocaine   Constipation    Diarrhea    DJD (degenerative joint disease)    see Dr. Kellie Simmering   Family history of anesthesia complication    daughter had surgery that didn't require vent but she ended up on one but pt has no idea why.   ZOXWRUEA(540.9)    Dr. 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   "right; inactive since 1999" (06/08/2012)   Lung nodules    multpile   Memory loss    Nocturia    Pneumonia    hx of about 5 yrs ago   PONV (postoperative nausea and vomiting)    Restless leg    takes tonic water at bedtime   Restless leg    Vertigo    doesn't take any meds   Weakness    numbness and tingling right leg    Past Surgical History:  Procedure Laterality Date   ANTERIOR LAT LUMBAR FUSION  06/08/2012   L2-3; L3-4   ANTERIOR LAT LUMBAR FUSION  06/08/2012   Procedure: ANTERIOR LATERAL LUMBAR FUSION 2 LEVELS;  Surgeon: Tia Alert, MD;  Location: [facility];  Service: Neurosurgery;  Laterality: Right;  Right Lumbar two-three, lumbar three-four extreme lateral interbody fusion with percutaneous pedicle screws   BASAL CELL CARCINOMA EXCISION  1995-2011   "off back; @ least a couple/yr; Dr. Danella Deis" (06/08/2012)    BREAST BIOPSY  07/2013   left breast   CATARACT EXTRACTION W/ INTRAOCULAR LENS  IMPLANT, BILATERAL  2011   per Dr. Pecola Leisure in [place]   CHOLECYSTECTOMY  01/2003   colonscopy  08-03-13   per Dr. Rhea Belton, clear, no repeats needed    dexa  01/2009   normal   DILATION AND CURETTAGE OF UTERUS  2000's   ESOPHAGOGASTRODUODENOSCOPY     LUMBAR LAMINECTOMY/DECOMPRESSION MICRODISCECTOMY Right 10/09/2015   Procedure: Right Lumbar Five-Sacral One Extraforaminal Foraminotomy with removal of synovial cyst;  Surgeon: Tia Alert, MD;  Location: [facility];  Service: Neurosurgery;  Laterality: Right;   LUMBAR PERCUTANEOUS PEDICLE SCREW 1 LEVEL  06/08/2012   Procedure: LUMBAR PERCUTANEOUS PEDICLE SCREW 1 LEVEL;  Surgeon: Tia Alert, MD;  Location: [facility];  Service: Neurosurgery;  Laterality: Right;  Right Lumbar two-three, lumbar three-four extreme lateral interbody fusion with percutaneous pedicle screws   LUNG LOBECTOMY  08/1997   RLL "for carcinoid" (06/08/2012)   TONSILLECTOMY  1947   TOTAL HIP ARTHROPLASTY Right 01/28/2016  Procedure: RIGHT TOTAL HIP ARTHROPLASTY ANTERIOR APPROACH;  Surgeon: Sheral Apley, MD;  Location: [facility];  Service: Orthopedics;  Laterality: Right;   TUBAL LIGATION  ~ 1976    Social History   Socioeconomic History   Marital status: Married    Spouse name: Not on file   Number of children: Not on file   Years of education: Not on file   Highest education level: Not on file  Occupational History   Not on file  Tobacco Use   Smoking status: Never   Smokeless tobacco: Never  Substance and Sexual Activity   Alcohol use: No    Alcohol/week: 0.0 standard drinks of alcohol   Drug use: No   Sexual activity: Yes  Other Topics Concern   Not on file  Social History Narrative   Married         Social Determinants of Health   Financial Resource Strain: Low Risk  (10/23/2021)   Overall Financial Resource Strain (CARDIA)    Difficulty of Paying Living Expenses:  Not hard at all  Food Insecurity: No Food Insecurity (10/23/2021)   Hunger Vital Sign    Worried About Running Out of Food in the Last Year: Never true    Ran Out of Food in the Last Year: Never true  Transportation Needs: No Transportation Needs (10/23/2021)   PRAPARE - Administrator, Civil Service (Medical): No    Lack of Transportation (Non-Medical): No  Physical Activity: Sufficiently Active (10/23/2021)   Exercise Vital Sign    Days of Exercise per Week: 3 days    Minutes of Exercise per Session: 60 min  Stress: No Stress Concern Present (10/23/2021)   [employer] of Occupational Health - Occupational Stress Questionnaire    Feeling of Stress : Not at all  Social Connections: Socially Integrated (10/23/2021)   Social Connection and Isolation Panel [NHANES]    Frequency of Communication with Friends and Family: More than three times a week    Frequency of Social Gatherings with Friends and Family: More than three times a week    Attends Religious Services: More than 4 times per year    Active Member of [employer] or Organizations: Yes    Attends Engineer, structural: More than 4 times per year    Marital Status: Married  Catering manager Violence: Not At Risk (10/23/2021)   Humiliation, Afraid, Rape, and Kick questionnaire    Fear of Current or Ex-Partner: No    Emotionally Abused: No    Physically Abused: No    Sexually Abused: No    Family History  Problem Relation Age of Onset   Multiple sclerosis Brother    Asthma Other    Coronary artery disease Other    Diabetes Other    Hypertension Other    Melanoma Other    Osteoporosis Other    Uterine cancer Other    Kidney cancer Other    Colon cancer Neg Hx     Current Outpatient Medications on File Prior to Visit  Medication Sig Dispense Refill   acetaminophen (TYLENOL) 500 MG tablet Take 1,000 mg by mouth 3 (three) times daily as needed for mild pain.      amLODipine (NORVASC) 5 MG tablet TAKE 1 TABLET BY  MOUTH DAILY 90 tablet 3   b complex vitamins tablet Take 1 tablet by mouth daily.     Calcium Carb-Cholecalciferol (CALCIUM 600 + D PO) Take 600 mg by mouth 2 (two) times daily.  fish oil-omega-3 fatty acids 1000 MG capsule Take 1 g by mouth daily.     gabapentin (NEURONTIN) 300 MG capsule TAKE 1 CAPSULE BY MOUTH 3 TIMES  DAILY 270 capsule 3   MAGNESIUM-ZINC PO Take 1 tablet by mouth every other day.      meclizine (ANTIVERT) 25 MG tablet Take 1 tablet (25 mg total) by mouth every 4 (four) hours as needed for dizziness. 60 tablet 1   Melatonin 10 MG CAPS Take 10 mg by mouth.     MULTIPLE VITAMIN PO Take 1 tablet by mouth daily.     potassium chloride SA (KLOR-CON M) 20 MEQ tablet TAKE 1 TABLET BY MOUTH TWICE  DAILY 180 tablet 3   sulfamethoxazole-trimethoprim (BACTRIM DS) 800-160 MG tablet Take 1 tablet by mouth 2 (two) times daily. 14 tablet 0   triamcinolone cream (KENALOG) 0.1 % Apply 1 application topically 2 (two) times daily. 45 g 1   triamterene-hydrochlorothiazide (MAXZIDE-25) 37.5-25 MG tablet TAKE 1 TABLET BY MOUTH DAILY 90 tablet 3   urea (CARMOL) 20 % cream Apply topically as needed. 85 g 0   vitamin C (ASCORBIC ACID) 500 MG tablet Take 500 mg by mouth daily as needed (in winter months).     vitamin E 400 UNIT capsule Take 400 Units by mouth daily.     No current facility-administered medications on file prior to visit.    Allergies  Allergen Reactions   Carbocaine [Mepivacaine Hcl] Hives and Nausea And Vomiting   Codeine Anaphylaxis, Shortness Of Breath, Nausea And Vomiting and Other (See Comments)    Respiratory distress; Low blood pressure. Pt tolerates percocet.   Hydrocodone Bit-Homatrop Mbr Anaphylaxis, Nausea And Vomiting and Shortness Of Breath    Dangerously low blood pressure, respiratory distress; heaviness in chest   Metrizamide Hives    Pre-medicated with Benadryl 50mg  PO   Propoxyphene N-Acetaminophen Anaphylaxis and Nausea And Vomiting    "Darvocet" t;  Lightheadedness, confusion   Robaxin [Methocarbamol] Other (See Comments)    "severe Altzheimer's symptoms"   Tape Rash and Other (See Comments)    Surgical tape causes rash and big blisters   Tramadol Other (See Comments)    Low respiratory rate, nausea, chest heaviness   Vicodin [Hydrocodone-Acetaminophen] Anaphylaxis   Cefazolin Hives, Nausea And Vomiting and Other (See Comments)    "Ancef"; Also, severe stomach pain   Iodinated Contrast Media Hives    Pre-medicated with Benadryl 50mg  PO   Penicillins Hives, Nausea And Vomiting and Other (See Comments)    severe stomach pain, Has patient had a PCN reaction causing immediate rash, facial/tongue/throat swelling, SOB or lightheadedness with hypotension: Yes Has patient had a PCN reaction causing severe rash involving mucus membranes or skin necrosis: No Has patient had a PCN reaction that required hospitalization No Has patient had a PCN reaction occurring within the last 10 years: Yes If all of the above answers are "NO", then may proceed with Cephalosporin use.    Amoxil [Amoxicillin]     Itching and rash over body, N & V, itchy scalp    Nsaids Other (See Comments)    Bruising; use with caution       Physical Exam There were no vitals filed for this visit. Estimated body mass index is 26.63 kg/m as calculated from the following:   Height as of 06/05/22: 5\' 5"  (1.651 m).   Weight as of 06/05/22: 160 lb (72.6 kg).  EKG (optional): deferred due to virtual visit  GENERAL: alert, oriented, no acute  distress detected, full vision exam deferred due to pandemic and/or virtual encounter  PSYCH/NEURO: pleasant and cooperative, no obvious depression or anxiety, speech and thought processing grossly intact, Cognitive function grossly intact  Flowsheet Row Clinical Support from 10/23/2021 in [facility] [place]  PHQ-9 Total Score 0           10/27/2022    9:21 AM 10/23/2021    1:30 PM 10/07/2021    8:39 AM  05/27/2021    1:44 PM 04/22/2021    3:41 PM  Depression screen PHQ 2/9  Decreased Interest 0 0 0 0 0  Down, Depressed, Hopeless 0 0 0 0 0  PHQ - 2 Score 0 0 0 0 0  Altered sleeping  0 0 2   Tired, decreased energy  0 2 1   Change in appetite  0 0 0   Feeling bad or failure about yourself   0 0 0   Trouble concentrating  0 0 0   Moving slowly or fidgety/restless  0 0 0   Suicidal thoughts  0 0 0   PHQ-9 Score  0 2 3   Difficult doing work/chores  Not difficult at all Not difficult at all Not difficult at all        04/22/2021    3:39 PM 05/27/2021    1:44 PM 10/07/2021    8:39 AM 10/23/2021    1:34 PM 10/27/2022    9:21 AM  Fall Risk  Falls in the past year? 1 1 1  0 0  Number of falls in past year - Comments fell going [place] golf [place]      Was there an injury with Fall? 0 0 0 0   Fall Risk Category Calculator 1 1 1  0   Fall Risk Category (Retired) Low Low Low Low   (RETIRED) Patient Fall Risk Level Moderate fall risk Moderate fall risk Low fall risk Low fall risk   Patient at Risk for Falls Due to History of fall(s) No Fall Risks No Fall Risks No Fall Risks No Fall Risks  Fall risk Follow up Education provided;Falls prevention discussed Falls evaluation completed;Education provided Falls evaluation completed  Falls evaluation completed     SUMMARY AND PLAN:  Encounter for Medicare annual wellness exam   Discussed applicable health maintenance/preventive health measures and advised and referred or ordered per patient preferences: Discussed covid booster recommendations per cdc Discussed dexa and she is considering - she agrees to call if wishes to repeat  Health Maintenance  Topic Date Due   COVID-19 Vaccine (1) 06/22/2023 (Originally 07/26/1941)   INFLUENZA VACCINE  01/21/2023   Medicare Annual Wellness (AWV)  10/27/2023   Pneumonia Vaccine 32+ Years old  Completed   DEXA SCAN  Completed   Zoster Vaccines- Shingrix  Completed   HPV VACCINES  Aged Out   DTaP/Tdap/Td   Discontinued   Education and counseling on the following was provided based on the above review of health and a plan/checklist for the patient, along with additional information discussed, was provided for the patient in the patient instructions :  -Advised and counseled on a healthy lifestyle - including the importance of a healthy diet, regular physical activity, social connections and stress management. -Reviewed patient's current diet. Advised and counseled on a whole foods based healthy diet. A summary of a healthy diet was provided in the Patient Instructions.  -reviewed patient's current physical activity level and discussed exercise guidelines for adults. Discussed community resources and  ideas for safe exercise at home to assist in meeting exercise guideline recommendations in a safe and healthy way. Discussed exercises that assist in building strong bones.  -Advise yearly dental visits at minimum and regular eye exams   Follow up: see patient instructions     Patient Instructions  I really enjoyed getting to talk with you today! I am available on Tuesdays and Thursdays for virtual visits if you have any questions or concerns, or if I can be of any further assistance.   CHECKLIST FROM ANNUAL WELLNESS VISIT:  -Follow up (please call to schedule if not scheduled after visit):   -yearly for annual wellness visit with primary care office  Here is a list of your preventive care/health maintenance measures and the plan for each if any are due:  PLAN For any measures below that may be due:  -covid boosters can be obtained at the pharmacy -if you wish to do the bone density test again, please let Dr. Clent Ridges know  Health Maintenance  Topic Date Due   COVID-19 Vaccine (1) 06/22/2023 (Originally 07/26/1941)   INFLUENZA VACCINE  01/21/2023   Medicare Annual Wellness (AWV)  10/27/2023   Pneumonia Vaccine 80+ Years old  Completed   DEXA SCAN  Completed   Zoster Vaccines- Shingrix  Completed    HPV VACCINES  Aged Out   DTaP/Tdap/Td  Discontinued    -See a dentist at least yearly  -Get your eyes checked and then per your eye specialist's recommendations  -Other issues addressed today:   -I have included below further information regarding a healthy whole foods based diet, physical activity guidelines for adults, stress management and opportunities for social connections. I hope you find this information useful.   -----------------------------------------------------------------------------------------------------------------------------------------------------------------------------------------------------------------------------------------------------------  NUTRITION: -eat real food: lots of colorful vegetables (half the plate) and fruits -5-7 servings of vegetables and fruits per day (fresh or steamed is best), exp. 2 servings of vegetables with lunch and dinner and 2 servings of fruit per day. Berries and greens such as kale and collards are great choices.  -consume on a regular basis: whole grains (make sure first ingredient on label contains the word "whole"), fresh fruits, fish, nuts, seeds, healthy oils (such as olive oil, avocado oil, grape seed oil) -may eat small amounts of dairy and lean meat on occasion, but avoid processed meats such as ham, bacon, lunch meat, etc. -drink water -try to avoid fast food and pre-packaged foods, processed meat -most experts advise limiting sodium to < 2300mg  per day, should limit further is any chronic conditions such as high blood pressure, heart disease, diabetes, etc. The American Heart Association advised that < 1500mg  is is ideal -try to avoid foods that contain any ingredients with names you do not recognize  -try to avoid sugar/sweets (except for the natural sugar that occurs in fresh fruit) -try to avoid sweet drinks -try to avoid white rice, white bread, pasta (unless whole grain), white or yellow potatoes  EXERCISE  GUIDELINES FOR ADULTS: -if you wish to increase your physical activity, do so gradually and with the approval of your doctor -STOP and seek medical care immediately if you have any chest pain, chest discomfort or trouble breathing when starting or increasing exercise  -move and stretch your body, legs, feet and arms when sitting for long periods -Physical activity guidelines for optimal health in adults: -least 150 minutes per week of aerobic exercise (can talk, but not sing) once approved by your doctor, 20-30 minutes of sustained activity or two  10 minute episodes of sustained activity every day.  -resistance training at least 2 days per week if approved by your doctor -balance exercises 3+ days per week:   Stand somewhere where you have something sturdy to hold onto if you lose balance.    1) lift up on toes, start with 5x per day and work up to 20x   2) stand and lift on leg straight out to the side so that foot is a few inches of the floor, start with 5x each side and work up to 20x each side   3) stand on one foot, start with 5 seconds each side and work up to 20 seconds on each side          \   Terressa Koyanagi, DO

## 2022-10-27 NOTE — Patient Instructions (Signed)
I really enjoyed getting to talk with you today! I am available on Tuesdays and Thursdays for virtual visits if you have any questions or concerns, or if I can be of any further assistance.   CHECKLIST FROM ANNUAL WELLNESS VISIT:  -Follow up (please call to schedule if not scheduled after visit):   -yearly for annual wellness visit with primary care office  Here is a list of your preventive care/health maintenance measures and the plan for each if any are due:  PLAN For any measures below that may be due:  -covid boosters can be obtained at the pharmacy -if you wish to do the bone density test again, please let Dr. Clent Ridges know  Health Maintenance  Topic Date Due   COVID-19 Vaccine (1) 06/22/2023 (Originally 07/26/1941)   INFLUENZA VACCINE  01/21/2023   Medicare Annual Wellness (AWV)  10/27/2023   Pneumonia Vaccine 62+ Years old  Completed   DEXA SCAN  Completed   Zoster Vaccines- Shingrix  Completed   HPV VACCINES  Aged Out   DTaP/Tdap/Td  Discontinued    -See a dentist at least yearly  -Get your eyes checked and then per your eye specialist's recommendations  -Other issues addressed today:   -I have included below further information regarding a healthy whole foods based diet, physical activity guidelines for adults, stress management and opportunities for social connections. I hope you find this information useful.   -----------------------------------------------------------------------------------------------------------------------------------------------------------------------------------------------------------------------------------------------------------  NUTRITION: -eat real food: lots of colorful vegetables (half the plate) and fruits -5-7 servings of vegetables and fruits per day (fresh or steamed is best), exp. 2 servings of vegetables with lunch and dinner and 2 servings of fruit per day. Berries and greens such as kale and collards are great choices.  -consume on  a regular basis: whole grains (make sure first ingredient on label contains the word "whole"), fresh fruits, fish, nuts, seeds, healthy oils (such as olive oil, avocado oil, grape seed oil) -may eat small amounts of dairy and lean meat on occasion, but avoid processed meats such as ham, bacon, lunch meat, etc. -drink water -try to avoid fast food and pre-packaged foods, processed meat -most experts advise limiting sodium to < 2300mg  per day, should limit further is any chronic conditions such as high blood pressure, heart disease, diabetes, etc. The American Heart Association advised that < 1500mg  is is ideal -try to avoid foods that contain any ingredients with names you do not recognize  -try to avoid sugar/sweets (except for the natural sugar that occurs in fresh fruit) -try to avoid sweet drinks -try to avoid white rice, white bread, pasta (unless whole grain), white or yellow potatoes  EXERCISE GUIDELINES FOR ADULTS: -if you wish to increase your physical activity, do so gradually and with the approval of your doctor -STOP and seek medical care immediately if you have any chest pain, chest discomfort or trouble breathing when starting or increasing exercise  -move and stretch your body, legs, feet and arms when sitting for long periods -Physical activity guidelines for optimal health in adults: -least 150 minutes per week of aerobic exercise (can talk, but not sing) once approved by your doctor, 20-30 minutes of sustained activity or two 10 minute episodes of sustained activity every day.  -resistance training at least 2 days per week if approved by your doctor -balance exercises 3+ days per week:   Stand somewhere where you have something sturdy to hold onto if you lose balance.    1) lift up on toes, start  with 5x per day and work up to 20x   2) stand and lift on leg straight out to the side so that foot is a few inches of the floor, start with 5x each side and work up to 20x each  side   3) stand on one foot, start with 5 seconds each side and work up to 20 seconds on each side          \

## 2022-11-10 DIAGNOSIS — H16223 Keratoconjunctivitis sicca, not specified as Sjogren's, bilateral: Secondary | ICD-10-CM | POA: Diagnosis not present

## 2023-02-24 ENCOUNTER — Other Ambulatory Visit: Payer: Self-pay | Admitting: Family Medicine

## 2023-02-24 DIAGNOSIS — I10 Essential (primary) hypertension: Secondary | ICD-10-CM

## 2023-02-25 ENCOUNTER — Other Ambulatory Visit: Payer: Self-pay | Admitting: Family Medicine

## 2023-03-09 DIAGNOSIS — L821 Other seborrheic keratosis: Secondary | ICD-10-CM | POA: Diagnosis not present

## 2023-03-09 DIAGNOSIS — Z129 Encounter for screening for malignant neoplasm, site unspecified: Secondary | ICD-10-CM | POA: Diagnosis not present

## 2023-03-09 DIAGNOSIS — Z85828 Personal history of other malignant neoplasm of skin: Secondary | ICD-10-CM | POA: Diagnosis not present

## 2023-03-09 DIAGNOSIS — L570 Actinic keratosis: Secondary | ICD-10-CM | POA: Diagnosis not present

## 2023-03-09 DIAGNOSIS — D485 Neoplasm of uncertain behavior of skin: Secondary | ICD-10-CM | POA: Diagnosis not present

## 2023-03-09 DIAGNOSIS — L820 Inflamed seborrheic keratosis: Secondary | ICD-10-CM | POA: Diagnosis not present

## 2023-03-09 DIAGNOSIS — C4441 Basal cell carcinoma of skin of scalp and neck: Secondary | ICD-10-CM | POA: Diagnosis not present

## 2023-03-12 DIAGNOSIS — H4089 Other specified glaucoma: Secondary | ICD-10-CM | POA: Diagnosis not present

## 2023-03-15 DIAGNOSIS — H4089 Other specified glaucoma: Secondary | ICD-10-CM | POA: Diagnosis not present

## 2023-03-18 DIAGNOSIS — C4441 Basal cell carcinoma of skin of scalp and neck: Secondary | ICD-10-CM | POA: Diagnosis not present

## 2023-03-22 DIAGNOSIS — H4089 Other specified glaucoma: Secondary | ICD-10-CM | POA: Diagnosis not present

## 2023-04-01 DIAGNOSIS — L905 Scar conditions and fibrosis of skin: Secondary | ICD-10-CM | POA: Diagnosis not present

## 2023-04-05 DIAGNOSIS — H40002 Preglaucoma, unspecified, left eye: Secondary | ICD-10-CM | POA: Diagnosis not present

## 2023-04-07 ENCOUNTER — Ambulatory Visit (INDEPENDENT_AMBULATORY_CARE_PROVIDER_SITE_OTHER): Payer: Medicare Other

## 2023-04-07 DIAGNOSIS — Z23 Encounter for immunization: Secondary | ICD-10-CM

## 2023-04-07 NOTE — Progress Notes (Signed)
Pt here with her husband and is requesting high dose flu vaccine.   Administered high dose flu vaccine. Pt tolerated well.

## 2023-05-03 DIAGNOSIS — H40002 Preglaucoma, unspecified, left eye: Secondary | ICD-10-CM | POA: Diagnosis not present

## 2023-06-30 ENCOUNTER — Encounter: Payer: Self-pay | Admitting: Family Medicine

## 2023-06-30 ENCOUNTER — Ambulatory Visit: Payer: Medicare Other | Admitting: Family Medicine

## 2023-06-30 VITALS — BP 136/78 | HR 61 | Temp 97.6°F | Ht 65.0 in | Wt 154.2 lb

## 2023-06-30 DIAGNOSIS — R739 Hyperglycemia, unspecified: Secondary | ICD-10-CM

## 2023-06-30 DIAGNOSIS — K219 Gastro-esophageal reflux disease without esophagitis: Secondary | ICD-10-CM

## 2023-06-30 DIAGNOSIS — M150 Primary generalized (osteo)arthritis: Secondary | ICD-10-CM

## 2023-06-30 DIAGNOSIS — M810 Age-related osteoporosis without current pathological fracture: Secondary | ICD-10-CM | POA: Diagnosis not present

## 2023-06-30 DIAGNOSIS — F5101 Primary insomnia: Secondary | ICD-10-CM

## 2023-06-30 DIAGNOSIS — I10 Essential (primary) hypertension: Secondary | ICD-10-CM | POA: Diagnosis not present

## 2023-06-30 DIAGNOSIS — I872 Venous insufficiency (chronic) (peripheral): Secondary | ICD-10-CM | POA: Diagnosis not present

## 2023-06-30 DIAGNOSIS — F418 Other specified anxiety disorders: Secondary | ICD-10-CM

## 2023-06-30 DIAGNOSIS — M5440 Lumbago with sciatica, unspecified side: Secondary | ICD-10-CM | POA: Diagnosis not present

## 2023-06-30 DIAGNOSIS — M542 Cervicalgia: Secondary | ICD-10-CM

## 2023-06-30 DIAGNOSIS — G8929 Other chronic pain: Secondary | ICD-10-CM

## 2023-06-30 LAB — LIPID PANEL
Cholesterol: 200 mg/dL (ref 0–200)
HDL: 47.4 mg/dL (ref >39.00)
LDL Cholesterol: 132 mg/dL — ABNORMAL HIGH (ref 0–99)
NonHDL: 152.16
Total CHOL/HDL Ratio: 4
Triglycerides: 103 mg/dL (ref 0.0–149.0)
VLDL: 20.6 mg/dL (ref 0.0–40.0)

## 2023-06-30 LAB — CBC WITH DIFFERENTIAL/PLATELET
Basophils Absolute: 0.1 10*3/uL (ref 0.0–0.1)
Basophils Relative: 1.1 % (ref 0.0–3.0)
Eosinophils Absolute: 0.1 10*3/uL (ref 0.0–0.7)
Eosinophils Relative: 1.6 % (ref 0.0–5.0)
HCT: 44.9 % (ref 36.0–46.0)
Hemoglobin: 14.8 g/dL (ref 12.0–15.0)
Lymphocytes Relative: 28.9 % (ref 12.0–46.0)
Lymphs Abs: 1.8 10*3/uL (ref 0.7–4.0)
MCHC: 32.9 g/dL (ref 30.0–36.0)
MCV: 93.9 fL (ref 78.0–100.0)
Monocytes Absolute: 0.4 10*3/uL (ref 0.1–1.0)
Monocytes Relative: 6.8 % (ref 3.0–12.0)
Neutro Abs: 3.8 10*3/uL (ref 1.4–7.7)
Neutrophils Relative %: 61.6 % (ref 43.0–77.0)
Platelets: 248 10*3/uL (ref 150.0–400.0)
RBC: 4.79 Mil/uL (ref 3.87–5.11)
RDW: 14.6 % (ref 11.5–15.5)
WBC: 6.1 10*3/uL (ref 4.0–10.5)

## 2023-06-30 LAB — HEPATIC FUNCTION PANEL
ALT: 13 U/L (ref 0–35)
AST: 19 U/L (ref 0–37)
Albumin: 4.6 g/dL (ref 3.5–5.2)
Alkaline Phosphatase: 78 U/L (ref 39–117)
Bilirubin, Direct: 0.1 mg/dL (ref 0.0–0.3)
Total Bilirubin: 0.5 mg/dL (ref 0.2–1.2)
Total Protein: 7.2 g/dL (ref 6.0–8.3)

## 2023-06-30 LAB — BASIC METABOLIC PANEL
BUN: 20 mg/dL (ref 6–23)
CO2: 32 meq/L (ref 19–32)
Calcium: 10.3 mg/dL (ref 8.4–10.5)
Chloride: 101 meq/L (ref 96–112)
Creatinine, Ser: 0.89 mg/dL (ref 0.40–1.20)
GFR: 60.8 mL/min (ref >60.00)
Glucose, Bld: 83 mg/dL (ref 70–99)
Potassium: 4.1 meq/L (ref 3.5–5.1)
Sodium: 141 meq/L (ref 135–145)

## 2023-06-30 LAB — HEMOGLOBIN A1C: Hgb A1c MFr Bld: 6.4 % (ref 4.6–6.5)

## 2023-06-30 LAB — TSH: TSH: 1.67 u[IU]/mL (ref 0.35–5.50)

## 2023-06-30 MED ORDER — GABAPENTIN 300 MG PO CAPS: 300.0000 mg | ORAL_CAPSULE | Freq: Two times a day (BID) | ORAL | 0 refills | Status: DC

## 2023-06-30 NOTE — Progress Notes (Signed)
 Subjective:    Patient ID: Karen Watson, female    DOB: 02-13-1941, 83 y.o.   MRN: [id number]  HPI Here to follow up on issues. She feels well and has no complaints. Her back and neck pain are stable. Her moods are stable. Her OA and HTN are well controlled.    Review of Systems  Constitutional: Negative.   HENT: Negative.    Eyes: Negative.   Respiratory: Negative.    Cardiovascular: Negative.   Gastrointestinal: Negative.   Genitourinary:  Negative for decreased urine volume, difficulty urinating, dyspareunia, dysuria, enuresis, flank pain, frequency, hematuria, pelvic pain and urgency.  Musculoskeletal:  Positive for arthralgias and back pain.  Skin: Negative.   Neurological: Negative.  Negative for headaches.  Psychiatric/Behavioral: Negative.         Objective:   Physical Exam Constitutional:      General: She is not in acute distress.    Appearance: Normal appearance. She is well-developed.  HENT:     Head: Normocephalic and atraumatic.     Right Ear: External ear normal.     Left Ear: External ear normal.     Nose: Nose normal.     Mouth/Throat:     Pharynx: No oropharyngeal exudate.  Eyes:     General: No scleral icterus.    Conjunctiva/sclera: Conjunctivae normal.     Pupils: Pupils are equal, round, and reactive to light.  Neck:     Thyroid : No thyromegaly.     Vascular: No JVD.  Cardiovascular:     Rate and Rhythm: Normal rate and regular rhythm.     Pulses: Normal pulses.     Heart sounds: Normal heart sounds. No murmur heard.    No friction rub. No gallop.  Pulmonary:     Effort: Pulmonary effort is normal. No respiratory distress.     Breath sounds: Normal breath sounds. No wheezing or rales.  Chest:     Chest wall: No tenderness.  Abdominal:     General: Bowel sounds are normal. There is no distension.     Palpations: Abdomen is soft. There is no mass.     Tenderness: There is no abdominal tenderness. There is no guarding or rebound.   Musculoskeletal:        General: No tenderness. Normal range of motion.     Cervical back: Normal range of motion and neck supple.  Lymphadenopathy:     Cervical: No cervical adenopathy.  Skin:    General: Skin is warm and dry.     Findings: No erythema or rash.  Neurological:     General: No focal deficit present.     Mental Status: She is alert and oriented to person, place, and time.     Cranial Nerves: No cranial nerve deficit.     Motor: No abnormal muscle tone.     Coordination: Coordination normal.     Deep Tendon Reflexes: Reflexes are normal and symmetric. Reflexes normal.  Psychiatric:        Mood and Affect: Mood normal.        Behavior: Behavior normal.        Thought Content: Thought content normal.        Judgment: Judgment normal.           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 Her neck and back pain and OA are stable. Her GERD is well controlled. We will get fasting labs to check lipids, etc. We spent  a total of (31   ) minutes reviewing records and discussing these issues.  Garnette Olmsted, MD

## 2023-07-07 ENCOUNTER — Encounter: Payer: Self-pay | Admitting: *Deleted

## 2023-07-21 DIAGNOSIS — D3A090 Benign carcinoid tumor of the bronchus and lung: Secondary | ICD-10-CM | POA: Diagnosis not present

## 2023-08-18 ENCOUNTER — Ambulatory Visit (INDEPENDENT_AMBULATORY_CARE_PROVIDER_SITE_OTHER): Payer: Medicare Other | Admitting: Family Medicine

## 2023-08-18 ENCOUNTER — Encounter: Payer: Self-pay | Admitting: Family Medicine

## 2023-08-18 ENCOUNTER — Ambulatory Visit: Payer: Medicare Other

## 2023-08-18 VITALS — BP 124/64 | HR 67 | Wt 154.2 lb

## 2023-08-18 DIAGNOSIS — M25511 Pain in right shoulder: Secondary | ICD-10-CM

## 2023-08-18 DIAGNOSIS — M19011 Primary osteoarthritis, right shoulder: Secondary | ICD-10-CM | POA: Diagnosis not present

## 2023-08-18 DIAGNOSIS — G8929 Other chronic pain: Secondary | ICD-10-CM

## 2023-08-18 MED ORDER — METHYLPREDNISOLONE 4 MG PO TBPK: ORAL_TABLET | ORAL | 0 refills | Status: DC

## 2023-08-18 NOTE — Progress Notes (Signed)
   Subjective:    Patient ID: Karen Watson, female    DOB: 23-Jul-1940, 83 y.o.   MRN: [id number]  HPI Here for 6 months of progressive pain in the right shoulder. No hx of trauma. She works out at [employer] several days a week, and she has cut way back on the amount of weight that she lifts. The pain is in the shoulder and the upper arm. No numbness or tingling.    Review of Systems  Constitutional: Negative.   Respiratory: Negative.    Cardiovascular: Negative.   Musculoskeletal:  Positive for arthralgias.       Objective:   Physical Exam Constitutional:      Appearance: Normal appearance.  Cardiovascular:     Rate and Rhythm: Normal rate and regular rhythm.     Pulses: Normal pulses.     Heart sounds: Normal heart sounds.  Pulmonary:     Effort: Pulmonary effort is normal.     Breath sounds: Normal breath sounds.  Musculoskeletal:     Comments: She is tender in the anterior right shoulder. ROM is limited by pain.   Neurological:     Mental Status: She is alert.           Assessment & Plan:  Right shoulder pain, likely due to OA. We will get Xrays today. She will try a Medrol dose pack.  Gershon Crane, MD

## 2023-09-07 NOTE — Addendum Note (Signed)
 Addended by: Gershon Crane A on: 09/07/2023 04:51 PM   Modules accepted: Orders

## 2023-09-08 NOTE — Progress Notes (Signed)
 Spoke with pt advised of Dr Clent Ridges message verbalized understanding

## 2023-09-28 DIAGNOSIS — M25511 Pain in right shoulder: Secondary | ICD-10-CM | POA: Diagnosis not present

## 2023-10-20 DIAGNOSIS — M25511 Pain in right shoulder: Secondary | ICD-10-CM | POA: Diagnosis not present

## 2023-10-20 DIAGNOSIS — M25521 Pain in right elbow: Secondary | ICD-10-CM | POA: Diagnosis not present

## 2023-11-05 ENCOUNTER — Other Ambulatory Visit: Payer: Self-pay | Admitting: Family Medicine

## 2023-11-05 DIAGNOSIS — I10 Essential (primary) hypertension: Secondary | ICD-10-CM

## 2023-11-23 DIAGNOSIS — H4042X1 Glaucoma secondary to eye inflammation, left eye, mild stage: Secondary | ICD-10-CM | POA: Diagnosis not present

## 2023-11-30 DIAGNOSIS — S61411A Laceration without foreign body of right hand, initial encounter: Secondary | ICD-10-CM | POA: Diagnosis not present

## 2023-11-30 DIAGNOSIS — S61214A Laceration without foreign body of right ring finger without damage to nail, initial encounter: Secondary | ICD-10-CM | POA: Diagnosis not present

## 2023-11-30 DIAGNOSIS — Z23 Encounter for immunization: Secondary | ICD-10-CM | POA: Diagnosis not present

## 2023-12-07 ENCOUNTER — Encounter: Payer: Self-pay | Admitting: Family Medicine

## 2023-12-07 ENCOUNTER — Ambulatory Visit (INDEPENDENT_AMBULATORY_CARE_PROVIDER_SITE_OTHER): Admitting: Family Medicine

## 2023-12-07 VITALS — BP 110/60 | HR 63 | Temp 98.3°F | Wt 151.6 lb

## 2023-12-07 DIAGNOSIS — S61214D Laceration without foreign body of right ring finger without damage to nail, subsequent encounter: Secondary | ICD-10-CM

## 2023-12-07 DIAGNOSIS — H6123 Impacted cerumen, bilateral: Secondary | ICD-10-CM | POA: Diagnosis not present

## 2023-12-07 NOTE — Progress Notes (Signed)
   Subjective:    Patient ID: Karen Watson, female    DOB: 1941/01/14, 83 y.o.   MRN: [id number]  HPI Here for two issues. First on 11-30-23 she lacerated her right 4th finger on the sharp edge of a shelf while staying with her husband at [facility]. She was taken to the ED where this was closed with sutures. These now need to be removed. Also she has not been able to hear well for the past week, so she thinks her ear canals are blocked with wax again. There is no pain.    Review of Systems  Constitutional: Negative.   HENT:  Positive for hearing loss. Negative for ear pain.   Respiratory: Negative.    Cardiovascular: Negative.   Skin:  Positive for wound.       Objective:   Physical Exam Constitutional:      Appearance: Normal appearance.  HENT:     Right Ear: There is impacted cerumen.     Left Ear: There is impacted cerumen.   Cardiovascular:     Rate and Rhythm: Normal rate and regular rhythm.     Pulses: Normal pulses.     Heart sounds: Normal heart sounds.  Pulmonary:     Effort: Pulmonary effort is normal.     Breath sounds: Normal breath sounds.   Skin:    Comments: Linear laceration on the dorsal surface of the right 4th finger. The wound is clean    Neurological:     Mental Status: She is alert.           Assessment & Plan:  After informed consent was obtained, both ear canals were irrigated clear with water. She tolerated this procedure well. As for the laceration, all sutures were removed. I advised to dress this with petroleum jelly and a Bandaid for one more week.  Corita Diego, MD

## 2023-12-28 ENCOUNTER — Ambulatory Visit: Payer: Self-pay

## 2023-12-28 NOTE — Telephone Encounter (Signed)
 FYI Only or Action Required?: Action required by provider: request for appointment.  Patient was last seen in primary care on 12/07/2023 by Karen Watson LABOR, MD.  Called Nurse Triage reporting Hand Pain.  Symptoms began several days ago.  Interventions attempted: Rest, hydration, or home remedies.  Symptoms are: unchanged.  Triage Disposition: See HCP Within 4 Hours (Or PCP Triage)-didn't want to go to [facility]-wants to be seen in office. Asking for a phone call back from PCP.   Patient/caregiver understands and will follow disposition?: No, wishes to speak with PCP  Copied from CRM [id number]. Topic: Clinical - Red Word Triage >> Dec 28, 2023 11:32 AM Karen Watson wrote: [name] that prompted transfer to Nurse Triage: Infected right ring finger. Patient recently got stitches taken out and she thinks a stitch is possibly left in her finger, looking infected - red and inflamed. Reason for Disposition  [1] Looks infected (spreading redness, red streak, pus) AND [2] large red area (> 2 inches or 5 cm, or entire finger)  Answer Assessment - Initial Assessment Questions 1. ONSET: When did the pain start?      Patient states discomfort to her finger that started about a week after stitches were removed.  2. LOCATION and RADIATION: Where is the pain located?  (Watson.g., fingertip, around nail, joint, entire  finger)      Right ring finger 3. SEVERITY: How bad is the pain? What does it keep you from doing?   (Scale 1-10; or mild, moderate, severe)  - MILD (1-3): doesn't interfere with normal activities.   - MODERATE (4-7): interferes with normal activities or awakens from sleep.  - SEVERE (8-10): excruciating pain, unable to hold a glass of water or bend finger even a little.     2-3 out of 10 4. APPEARANCE: What does the finger look like? (Watson.g., redness, swelling, bruising, pallor)     Finger is red and inflamed per patient report 5. WORK OR EXERCISE: Has there been any recent work or exercise  that involved this part (i.Watson., fingers or hand) of the body?     Patient had stitches placed back on 11/30/2023 due to laceration to finger 6. CAUSE: What do you think is causing the pain?     Patient reports that there was a stitch left in her finger.  7. AGGRAVATING FACTORS: What makes the pain worse? (Watson.g., using computer)     When its touches 8. OTHER SYMPTOMS: Do you have any other symptoms? (Watson.g., fever, neck pain, numbness)     Swelling to finger  Patient is requesting to be seen in office. Patient states she noticed a small stitch was left in her right ring finger but endorses this was a week after being in seen in the office for stitches removal. Patient is requesting to be seen in of the office by Dr. Johnny. Patient is needing a phone call from staff.  Protocols used: Finger Pain-A-AH

## 2023-12-29 NOTE — Telephone Encounter (Signed)
 Appointment scheduled for 12/31/23

## 2023-12-31 ENCOUNTER — Encounter: Payer: Self-pay | Admitting: Family Medicine

## 2023-12-31 ENCOUNTER — Ambulatory Visit (INDEPENDENT_AMBULATORY_CARE_PROVIDER_SITE_OTHER): Admitting: Family Medicine

## 2023-12-31 VITALS — BP 110/60 | HR 67 | Temp 98.4°F | Ht 65.0 in | Wt 151.2 lb

## 2023-12-31 DIAGNOSIS — F5101 Primary insomnia: Secondary | ICD-10-CM | POA: Diagnosis not present

## 2023-12-31 DIAGNOSIS — Z189 Retained foreign body fragments, unspecified material: Secondary | ICD-10-CM

## 2023-12-31 DIAGNOSIS — T8189XA Other complications of procedures, not elsewhere classified, initial encounter: Secondary | ICD-10-CM | POA: Diagnosis not present

## 2023-12-31 MED ORDER — TRAZODONE HCL 50 MG PO TABS: 50.0000 mg | ORAL_TABLET | Freq: Every day | ORAL | 2 refills | Status: DC

## 2023-12-31 NOTE — Progress Notes (Signed)
   Subjective:    Patient ID: Karen Watson, female    DOB: 07-22-1940, 83 y.o.   MRN: [id number]  HPI Here for 2 issues. First she still has irritation in the right ring finger. On 11-30-23 she had sutures placed to close a laceration on this finger. Then on 12-07-23 these were removed. She had been dressing it with Vaseline every day, and it seemed to be healing. Then earlier this week it turned red and irritated again, so she began applying Clobetasol ointment on it. The other issue is trouble sleeping. Since the death of her husband she has had difficulty both falling and staying asleep. She takes a Gabapentin  and a melatonin at bedtime.    Review of Systems  Constitutional: Negative.   Respiratory: Negative.    Cardiovascular: Negative.   Skin:  Positive for wound.  Psychiatric/Behavioral:  Positive for sleep disturbance.        Objective:   Physical Exam Constitutional:      Appearance: Normal appearance.  Cardiovascular:     Rate and Rhythm: Normal rate and regular rhythm.     Pulses: Normal pulses.     Heart sounds: Normal heart sounds.  Pulmonary:     Effort: Pulmonary effort is normal.     Breath sounds: Normal breath sounds.  Skin:    Comments: The dorsal surface of the right ring finger has a healed laceration over it which is red and slightly tender. No warmth. There is a tiny piece of blue material embedded in the skin beside this which appears to be a retained suture. Her ROM is full   Neurological:     Mental Status: She is alert and oriented to person, place, and time.  Psychiatric:        Mood and Affect: Mood normal.        Behavior: Behavior normal.        Thought Content: Thought content normal.           Assessment & Plan:  She has a retained suture in the right ring finger, and this was removed with forceps. She will go back to dressing it with Vaseline now. She also has insomnia, and we will treat this with Trazodone  50 mg at bedtime. She will report  back in one week.  Garnette Olmsted, MD

## 2024-01-23 ENCOUNTER — Encounter: Payer: Self-pay | Admitting: Family Medicine

## 2024-01-25 NOTE — Telephone Encounter (Signed)
 I recommend she take 2 tablets of this at bedtime (total of 100 mg) and then report back

## 2024-01-27 MED ORDER — TRAZODONE HCL 50 MG PO TABS: 50.0000 mg | ORAL_TABLET | Freq: Every day | ORAL | 5 refills | Status: DC

## 2024-02-03 ENCOUNTER — Other Ambulatory Visit: Payer: Self-pay | Admitting: Family Medicine

## 2024-02-03 DIAGNOSIS — I10 Essential (primary) hypertension: Secondary | ICD-10-CM

## 2024-02-10 MED ORDER — TRAZODONE HCL 50 MG PO TABS
75.0000 mg | ORAL_TABLET | Freq: Every day | ORAL | 3 refills | Status: AC
Start: 2024-02-10 — End: ?

## 2024-02-10 NOTE — Telephone Encounter (Signed)
 I sent in refills for the new dose

## 2024-02-10 NOTE — Addendum Note (Signed)
 Addended by: JOHNNY SENIOR A on: 02/10/2024 11:39 AM   Modules accepted: Orders

## 2024-02-20 ENCOUNTER — Emergency Department (HOSPITAL_BASED_OUTPATIENT_CLINIC_OR_DEPARTMENT_OTHER)

## 2024-02-20 ENCOUNTER — Emergency Department (HOSPITAL_BASED_OUTPATIENT_CLINIC_OR_DEPARTMENT_OTHER)
Admission: EM | Admit: 2024-02-20 | Discharge: 2024-02-20 | Disposition: A | Discharge status: Home / Self Care | Attending: Emergency Medicine | Admitting: Emergency Medicine

## 2024-02-20 ENCOUNTER — Encounter (HOSPITAL_BASED_OUTPATIENT_CLINIC_OR_DEPARTMENT_OTHER): Payer: Self-pay

## 2024-02-20 ENCOUNTER — Other Ambulatory Visit: Payer: Self-pay

## 2024-02-20 DIAGNOSIS — M47812 Spondylosis without myelopathy or radiculopathy, cervical region: Secondary | ICD-10-CM | POA: Diagnosis not present

## 2024-02-20 DIAGNOSIS — S0990XA Unspecified injury of head, initial encounter: Secondary | ICD-10-CM | POA: Diagnosis not present

## 2024-02-20 DIAGNOSIS — N183 Chronic kidney disease, stage 3 unspecified: Secondary | ICD-10-CM | POA: Insufficient documentation

## 2024-02-20 DIAGNOSIS — S060X0A Concussion without loss of consciousness, initial encounter: Secondary | ICD-10-CM | POA: Diagnosis not present

## 2024-02-20 DIAGNOSIS — H538 Other visual disturbances: Secondary | ICD-10-CM | POA: Diagnosis not present

## 2024-02-20 DIAGNOSIS — W01198A Fall on same level from slipping, tripping and stumbling with subsequent striking against other object, initial encounter: Secondary | ICD-10-CM | POA: Diagnosis not present

## 2024-02-20 DIAGNOSIS — R42 Dizziness and giddiness: Secondary | ICD-10-CM | POA: Diagnosis not present

## 2024-02-20 DIAGNOSIS — S199XXA Unspecified injury of neck, initial encounter: Secondary | ICD-10-CM | POA: Diagnosis not present

## 2024-02-20 DIAGNOSIS — I129 Hypertensive chronic kidney disease with stage 1 through stage 4 chronic kidney disease, or unspecified chronic kidney disease: Secondary | ICD-10-CM | POA: Insufficient documentation

## 2024-02-20 DIAGNOSIS — M50221 Other cervical disc displacement at C4-C5 level: Secondary | ICD-10-CM | POA: Diagnosis not present

## 2024-02-20 NOTE — ED Triage Notes (Signed)
 Pt reports falling x4 days ago and struck head. Pt reports tripping over a cart and hitting head against concrete floor. Pt denies any LOC. Pt denies any blood thinners. Pt reports increased dizziness and foggyness over the last few days.

## 2024-02-20 NOTE — ED Provider Notes (Signed)
 [place] EMERGENCY DEPARTMENT AT [facility] Provider Note   CSN: [id number] Arrival date & time: 02/20/24  1429     Patient presents with: Fall and Dizziness   Karen Watson is a 83 y.o. female.   Patient is an 83 year old female who presents with headache and some fogginess after recent head injury.  She was working in [employer] at her senior living facility about 4 days ago.  She was pushing a cart in [employer] and it got stuck and fell over.  She was trying to avoid it and landed on the back of her head.  She also had some bruising to her arms and legs but denies any other injuries.  She is not on anticoagulants.  She states that she has had an ongoing headache since that time and a little bit of fogginess.  A little bit of dizziness.  She said she has had some blurry vision as well.  No vomiting.  No numbness or weakness to her extremities.  No speech deficits.  No blind spots or double vision.  She denies any other symptoms such as fevers, cough or cold symptoms.  No chest pain or shortness of breath.  No urinary symptoms.  She is not on anticoagulants.       Prior to Admission medications   Medication Sig Start Date End Date Taking? Authorizing Provider  acetaminophen  (TYLENOL ) 500 MG tablet Take 1,000 mg by mouth 3 (three) times daily as needed for mild pain.     [provider]  amLODipine  (NORVASC ) 5 MG tablet TAKE 1 TABLET BY MOUTH DAILY 02/04/24   Johnny Garnette LABOR, MD  b complex vitamins tablet Take 1 tablet by mouth daily.    [provider]  Calcium Carb-Cholecalciferol (CALCIUM 600 + D PO) Take 600 mg by mouth 2 (two) times daily.    [provider]  gabapentin  (NEURONTIN ) 300 MG capsule TAKE 1 CAPSULE BY MOUTH 3 TIMES  DAILY 02/04/24   Johnny Garnette LABOR, MD  MAGNESIUM-ZINC PO Take 1 tablet by mouth every other day.     [provider]  meclizine  (ANTIVERT ) 25 MG tablet Take 1 tablet (25 mg total) by mouth every 4 (four) hours  as needed for dizziness. 07/28/21   Johnny Garnette LABOR, MD  Melatonin 10 MG CAPS Take 10 mg by mouth.    [provider]  MULTIPLE VITAMIN PO Take 1 tablet by mouth daily.    [provider]  potassium chloride  SA (KLOR-CON  M) 20 MEQ tablet TAKE 1 TABLET BY MOUTH TWICE  DAILY 02/04/24   Johnny Garnette LABOR, MD  traZODone  (DESYREL ) 50 MG tablet Take 1.5 tablets (75 mg total) by mouth at bedtime. 02/10/24   Johnny Garnette LABOR, MD  triamterene -hydrochlorothiazide  (MAXZIDE -25) 37.5-25 MG tablet TAKE 1 TABLET BY MOUTH DAILY 02/04/24   Johnny Garnette LABOR, MD  urea  (CARMOL) 20 % cream Apply topically as needed. 04/08/20   Johnny Garnette LABOR, MD  vitamin E 400 UNIT capsule Take 400 Units by mouth daily.    [provider]    Allergies: Carbocaine [mepivacaine hcl], Codeine, Hydrocodone bit-homatrop mbr, Metrizamide, Propoxyphene n-acetaminophen , Robaxin [methocarbamol], Tape, Tramadol, Vicodin [hydrocodone-acetaminophen ], Cefazolin , Iodinated contrast media, Penicillins, Amoxil [amoxicillin], and Nsaids    Review of Systems  Constitutional:  Negative for chills, diaphoresis, fatigue and fever.  HENT:  Negative for congestion, rhinorrhea and sneezing.   Eyes:  Positive for visual disturbance. Negative for photophobia, pain, discharge and redness.  Respiratory:  Negative for  cough, chest tightness and shortness of breath.   Cardiovascular:  Negative for chest pain and leg swelling.  Gastrointestinal:  Negative for abdominal pain, diarrhea, nausea and vomiting.  Genitourinary:  Negative for difficulty urinating, flank pain, frequency and hematuria.  Musculoskeletal:  Negative for arthralgias and back pain.  Skin:  Negative for rash.  Neurological:  Positive for dizziness and headaches. Negative for speech difficulty, weakness and numbness.    Updated Vital Signs BP (!) 132/52   Pulse 61   Temp 98 F (36.7 C) (Oral)   Resp 18   Ht 5' 5 (1.651 m)   Wt 65.8 kg   SpO2 93%   BMI 24.13 kg/m    Physical Exam Constitutional:      Appearance: She is well-developed.  HENT:     Head: Normocephalic and atraumatic.  Eyes:     Pupils: Pupils are equal, round, and reactive to light.  Cardiovascular:     Rate and Rhythm: Normal rate and regular rhythm.     Heart sounds: Normal heart sounds.  Pulmonary:     Effort: Pulmonary effort is normal. No respiratory distress.     Breath sounds: Normal breath sounds. No wheezing or rales.  Chest:     Chest wall: No tenderness.  Abdominal:     General: Bowel sounds are normal.     Palpations: Abdomen is soft.     Tenderness: There is no abdominal tenderness. There is no guarding or rebound.  Musculoskeletal:        General: Normal range of motion.     Cervical back: Normal range of motion and neck supple.  Lymphadenopathy:     Cervical: No cervical adenopathy.  Skin:    General: Skin is warm and dry.     Findings: No rash.  Neurological:     Mental Status: She is alert and oriented to person, place, and time.     Comments: Motor 5/5 all extremities Sensation grossly intact to LT all extremities Finger to Nose intact, no pronator drift CN II-XII grossly intact Gait normal Visual fields full to confrontation     (all labs ordered are listed, but only abnormal results are displayed) Labs Reviewed - No data to display  EKG: None  Radiology: CT Cervical Spine Wo Contrast Result Date: 02/20/2024 EXAM: CT CERVICAL SPINE WITHOUT CONTRAST 02/20/2024 03:39:00 PM TECHNIQUE: CT of the cervical spine was performed without the administration of intravenous contrast. Multiplanar reformatted images are provided for review. Automated exposure control, iterative reconstruction, and/or weight based adjustment of the mA/kV was utilized to reduce the radiation dose to as low as reasonably achievable. COMPARISON: None available. CLINICAL HISTORY: Neck trauma (Age >= 65y). Fall hit left posterior head, dizziness, denies LOC, not on blood thinners.  FINDINGS: CERVICAL SPINE: BONES AND ALIGNMENT: Straightening of the normal cervical lordosis is present. Slight degenerative retrolisthesis is present at C4-5 and C5-6. DEGENERATIVE CHANGES: Uncovertebral spurring contributes to foraminal narrowing bilaterally at C4-5, C5-6 and C6-7. SOFT TISSUES: No prevertebral soft tissue swelling. IMPRESSION: 1. No acute abnormality of the cervical spine related to the reported neck trauma. 2. Straightening of the normal cervical lordosis. 3. Slight degenerative retrolisthesis at C4-5 and C5-6. 4. Uncovertebral spurring contributing to foraminal narrowing bilaterally at C4-5, C5-6, and C6-7. Electronically signed by: Lonni Necessary MD 02/20/2024 04:04 PM EDT RP Workstation: [id number]   CT Head Wo Contrast Result Date: 02/20/2024 EXAM: CT HEAD WITHOUT CONTRAST 02/20/2024 03:39:00 PM TECHNIQUE: CT of the head was performed without the administration of  intravenous contrast. Automated exposure control, iterative reconstruction, and/or weight based adjustment of the mA/kV was utilized to reduce the radiation dose to as low as reasonably achievable. COMPARISON: None available. CLINICAL HISTORY: Head trauma, minor (Age >= 65y). Fall hit left posterior head, dizziness, denies LOC, not on blood thinners. FINDINGS: BRAIN AND VENTRICLES: No acute hemorrhage. No evidence of acute infarct. No hydrocephalus. No extra-axial collection. No mass effect or midline shift. Atrophy and white matter changes are within normal limits for age. ORBITS: No acute abnormality. SINUSES: No acute abnormality. SOFT TISSUES AND SKULL: No acute soft tissue abnormality. No skull fracture. IMPRESSION: 1. No acute intracranial abnormality. Electronically signed by: Lonni Necessary MD 02/20/2024 04:01 PM EDT RP Workstation: [id number]     Procedures   Medications Ordered in the ED - No data to display                                  Medical Decision Making Amount and/or Complexity of Data  Reviewed Radiology: ordered.   This patient presents to the ED for concern of headache, blurry vision, this involves an extensive number of treatment options, and is a complaint that carries with it a high risk of complications and morbidity.  I considered the following differential and admission for this acute, potentially life threatening condition.  The differential diagnosis includes concussion, intracranial hemorrhage, stroke, retinal detachment, migraine  MDM:    Patient is a 83 year old female who presents after she had a mechanical fall 4 days ago.  She has had some concussion type symptoms since that time with ongoing headache and some dizziness at times.  She also has some blurry vision which started after the fall.  She denies any blind spots.  No double vision.  She does not have any visual field deficits although on visual field checking, her left eye seemed to be a little bit blurrier than the right.  Visual acuity was checked and 20/40 in the left and 20/20 in the right.  She does not have any pain in the eye.  No redness.  No other symptoms that would be more concerning for glaucoma.  She does not have any symptoms that sound more concerning for retinal detachment.  CT of her head and cervical spine do not show any acute traumatic injuries.  She was discharged home in good condition.  She does have an ophthalmologist that she has had prior cataract surgery.  Will call on Tuesday to have close follow-up with her ophthalmologist to have a complete eye exam.  Also advised her to follow-up with her PCP regarding her concussion symptoms.  Return precautions were given.  (Labs, imaging, consults)  Labs: I Ordered, and personally interpreted labs.  The pertinent results include:     Imaging Studies ordered: I ordered imaging studies including CT head, CT cervical spine I independently visualized and interpreted imaging. I agree with the radiologist interpretation  Additional history  obtained from  .  External records from outside source obtained and reviewed including    Cardiac Monitoring: The patient was not maintained on a cardiac monitor.  If on the cardiac monitor, I personally viewed and interpreted the cardiac monitored which showed an underlying rhythm of:    Reevaluation: After the interventions noted above, I reevaluated the patient and found that they have :stayed the same  Social Determinants of Health:    Disposition: Discharged to home  Co morbidities that complicate the  patient evaluation  Past Medical History:  Diagnosis Date   Arthritis    thumbs, neck, shoulders, lower back (06/08/2012)   Basal cell carcinoma 1995-present   face and back (06/08/2012)   Chronic bronchitis (HCC)    last winter   Chronic kidney disease    states she is stage 3    Complication of anesthesia 01/2003   developed Hives, vomiting, SOB, chest heaviness after gallbladder surgery was told they believe it was due to either ancef  or carbocaine   Constipation    Diarrhea    DJD (degenerative joint disease)    see Dr. Everlean   Family history of anesthesia complication    daughter had surgery that didn't require vent but she ended up on one but pt has no idea why.   Yzjijryz(215.9)    Dr. 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   I have reviewed the patients home medicines and have made adjustments as needed  Problem List / ED Course: Problem List Items Addressed This Visit   None Visit Diagnoses       Concussion without loss of consciousness, initial encounter    -  Primary     Blurry vision                    Final diagnoses:  Concussion without loss of consciousness, initial encounter  Blurry vision    ED Discharge Orders     None          Lenor Hollering, MD 02/20/24 1824

## 2024-02-20 NOTE — ED Notes (Signed)
 Pt given discharge instructions and reviewed follow-up care. Opportunities given for questions. Pt verbalizes understanding. Bethena Powell SAUNDERS, RN

## 2024-02-20 NOTE — Discharge Instructions (Addendum)
 Make an appointment on Tuesday to have a complete eye exam with your ophthalmologist.  Make an appointment to have follow-up regarding your concussion symptoms with your primary care doctor.  Return to the emergency room if you have any worsening symptoms.

## 2024-02-22 ENCOUNTER — Encounter (INDEPENDENT_AMBULATORY_CARE_PROVIDER_SITE_OTHER): Admitting: Family Medicine

## 2024-02-22 NOTE — Progress Notes (Unsigned)
 error

## 2024-02-23 DIAGNOSIS — H43813 Vitreous degeneration, bilateral: Secondary | ICD-10-CM | POA: Diagnosis not present

## 2024-02-28 ENCOUNTER — Encounter: Payer: Self-pay | Admitting: Family Medicine

## 2024-02-28 ENCOUNTER — Ambulatory Visit: Admitting: Family Medicine

## 2024-02-28 VITALS — BP 118/64 | HR 59 | Temp 97.7°F | Wt 148.4 lb

## 2024-02-28 DIAGNOSIS — N390 Urinary tract infection, site not specified: Secondary | ICD-10-CM

## 2024-02-28 DIAGNOSIS — S060X0D Concussion without loss of consciousness, subsequent encounter: Secondary | ICD-10-CM

## 2024-02-28 DIAGNOSIS — S060X0A Concussion without loss of consciousness, initial encounter: Secondary | ICD-10-CM | POA: Insufficient documentation

## 2024-02-28 LAB — POCT URINALYSIS DIPSTICK
Bilirubin, UA: NEGATIVE
Blood, UA: NEGATIVE
Glucose, UA: NEGATIVE
Ketones, UA: NEGATIVE
Nitrite, UA: NEGATIVE
Protein, UA: NEGATIVE
Spec Grav, UA: 1.02 (ref 1.010–1.025)
Urobilinogen, UA: 0.2 U/dL
pH, UA: 6 (ref 5.0–8.0)

## 2024-02-28 MED ORDER — SULFAMETHOXAZOLE-TRIMETHOPRIM 800-160 MG PO TABS: 1.0000 | ORAL_TABLET | Freq: Two times a day (BID) | ORAL | 0 refills | Status: DC

## 2024-02-28 NOTE — Addendum Note (Signed)
 Addended by: LADONNA INOCENTE SAILOR on: 02/28/2024 11:02 AM   Modules accepted: Orders

## 2024-02-28 NOTE — Progress Notes (Signed)
   Subjective:    Patient ID: Karen Watson, female    DOB: [birth year], 83 y.o.   MRN: [id number]  HPI Here with her son to follow up on an ED visit on 02-20-24. Three days before that while she was volunteering at [employer], lost her balance pushing a heavy cart, and she fell backwards with the back of her head striking the floor. There was no LOC. She developed a headache, blurred vision, lightheadedness, and nausea after that. In the ED her exam was normal. She had non-contrasted CT scans of the head and cervical spine, and these were unremarkable. She was diagnosed with a concussion and sent home. Since then she says she has bee slowly but steadily improving. The headache is gone. Her vision is still slightly blurry but it has improved. The nausea is gone, and she is eating normally. She sill feels a little lightheaded and unbalanced. In addition to this,. She complains of 2 days of urgency to urinate, lower abdominal cramps, and her urin has a foul odor. She is drinking fluids.    Review of Systems  Constitutional:  Negative for activity change.  HENT: Negative.    Eyes:  Positive for visual disturbance.  Respiratory: Negative.    Cardiovascular: Negative.   Gastrointestinal: Negative.   Genitourinary:  Positive for dysuria, frequency and urgency. Negative for flank pain and hematuria.  Neurological:  Positive for light-headedness. Negative for headaches.       Objective:   Physical Exam Constitutional:      Comments: She is slightly unsteady on her feet, but she can walk by touching the walls   Cardiovascular:     Rate and Rhythm: Normal rate and regular rhythm.     Pulses: Normal pulses.     Heart sounds: Normal heart sounds.  Pulmonary:     Effort: Pulmonary effort is normal.     Breath sounds: Normal breath sounds.  Abdominal:     Tenderness: There is no right CVA tenderness or left CVA tenderness.  Neurological:     Mental Status: She is alert and oriented to person, place,  and time.     Coordination: Coordination abnormal.     Gait: Gait abnormal.           Assessment & Plan:  She is recovering from a concussion, and she making steady progress. I told her she can now watch TV for 30 minutes at a time. She can walk around her neighborhood using a walker as long as someone is with her. She is not ready to drive a car however. She has a UTI, and we will treat this with 7 days of Bactrim  DS. Culture the sample. We will have a follow up visit in one week.  Garnette Olmsted, MD

## 2024-03-01 ENCOUNTER — Ambulatory Visit: Payer: Self-pay | Admitting: Family Medicine

## 2024-03-01 LAB — URINE CULTURE
MICRO NUMBER:: 16936273
Result:: NO GROWTH
SPECIMEN QUALITY:: ADEQUATE

## 2024-03-06 ENCOUNTER — Ambulatory Visit: Admitting: Family Medicine

## 2024-03-06 ENCOUNTER — Encounter: Payer: Self-pay | Admitting: Family Medicine

## 2024-03-06 VITALS — BP 108/60 | HR 67 | Temp 98.1°F | Wt 149.6 lb

## 2024-03-06 DIAGNOSIS — S060X0D Concussion without loss of consciousness, subsequent encounter: Secondary | ICD-10-CM

## 2024-03-06 MED ORDER — ONDANSETRON HCL 8 MG PO TABS: 8.0000 mg | ORAL_TABLET | Freq: Three times a day (TID) | ORAL | 2 refills | Status: AC | PRN

## 2024-03-06 NOTE — Progress Notes (Signed)
   Subjective:    Patient ID: Karen Watson, female    DOB: 16-Jun-1941, 83 y.o.   MRN: [id number]  HPI Here with her son to follow up on a concussion that occurred 2 and 1/2 weeks ago. She went to the ED on 02-20-24, and she had a normal head CT. We saw her a week ago when she had headaches, fuzzy vision, and balance issues. Since then some things are better and some are not. She is still nauseated frequently, but she never vomits and she is able to eat and drink normally. The headaches are better. However the light sensitivity and balance issues have not improved. She uses a walker to get around her house.    Review of Systems  Constitutional: Negative.   Respiratory: Negative.    Cardiovascular: Negative.   Neurological:  Positive for dizziness. Negative for seizures, syncope, facial asymmetry, speech difficulty, weakness, numbness and headaches.       Objective:   Physical Exam Constitutional:      Comments: Wearing sunglasses, she walks holding onto her son  Cardiovascular:     Rate and Rhythm: Normal rate and regular rhythm.     Pulses: Normal pulses.     Heart sounds: Normal heart sounds.  Pulmonary:     Effort: Pulmonary effort is normal.     Breath sounds: Normal breath sounds.  Neurological:     Mental Status: She is alert and oriented to person, place, and time.     Comments: She is still unsteady on her feet and requires touching something to stand and walk            Assessment & Plan:  She is recovering from a concussion, and I reminded her that this may take weeks to months for this to heal. We will give her Zofran  to use as needed for the nausea. We will follow up with her in one week. Garnette Olmsted, MD

## 2024-03-08 DIAGNOSIS — H40002 Preglaucoma, unspecified, left eye: Secondary | ICD-10-CM | POA: Diagnosis not present

## 2024-03-13 ENCOUNTER — Ambulatory Visit (INDEPENDENT_AMBULATORY_CARE_PROVIDER_SITE_OTHER): Admitting: Family Medicine

## 2024-03-13 ENCOUNTER — Encounter: Payer: Self-pay | Admitting: Family Medicine

## 2024-03-13 VITALS — BP 100/58 | HR 70 | Temp 98.2°F | Wt 152.4 lb

## 2024-03-13 DIAGNOSIS — S060X0D Concussion without loss of consciousness, subsequent encounter: Secondary | ICD-10-CM | POA: Diagnosis not present

## 2024-03-13 NOTE — Progress Notes (Signed)
   Subjective:    Patient ID: Karen Watson, female    DOB: Nov 18, 1940, 83 y.o.   MRN: [id number]  HPI Here with her son to follow up on a concussion that occurred about a month ago. She has improved quite a bit in the last week. The nausea has almost disappeared, and she is eating normally. The headaches are less frequent and less intense. She is still using a walker around her house, but she feels much more steady than before.    Review of Systems  Constitutional: Negative.   Respiratory: Negative.    Cardiovascular: Negative.   Neurological:  Positive for headaches. Negative for dizziness and light-headedness.       Objective:   Physical Exam Constitutional:      Appearance: Normal appearance.     Comments: She looks much better today   Cardiovascular:     Rate and Rhythm: Normal rate and regular rhythm.     Pulses: Normal pulses.     Heart sounds: Normal heart sounds.  Pulmonary:     Effort: Pulmonary effort is normal.     Breath sounds: Normal breath sounds.  Neurological:     General: No focal deficit present.     Mental Status: She is alert and oriented to person, place, and time.           Assessment & Plan:  She is recovering from a concussion, and she is about 90% of the way back to normal. She can begin to watch TV or use her computer for 30 minute intervals this week, and she can drive her car for short distances as long as someone is with her. She can return to normal activities with no restrictions in one week. Recheck as needed.  Garnette Olmsted, MD

## 2024-03-15 ENCOUNTER — Encounter: Payer: Self-pay | Admitting: Internal Medicine

## 2024-04-17 ENCOUNTER — Other Ambulatory Visit: Payer: Self-pay | Admitting: Family Medicine

## 2024-04-17 DIAGNOSIS — R42 Dizziness and giddiness: Secondary | ICD-10-CM

## 2024-04-17 MED ORDER — MECLIZINE HCL 25 MG PO TABS: 25.0000 mg | ORAL_TABLET | ORAL | 1 refills | Status: AC | PRN

## 2024-04-17 NOTE — Telephone Encounter (Signed)
 Copied from CRM [id number]. Topic: Clinical - Medication Refill >> Apr 17, 2024 10:12 AM Burnard DEL wrote: Medication: meclizine  (ANTIVERT ) 25 MG tablet  Has the patient contacted their pharmacy? Yes (Agent: If no, request that the patient contact the pharmacy for the refill. If patient does not wish to contact the pharmacy document the reason why and proceed with request.) (Agent: If yes, when and what did the pharmacy advise?)  This is the patient's preferred pharmacy:  [id number] [place], [place] - [address] [place] [address] Phone: [phone] Fax: [phone]    Is this the correct pharmacy for this prescription? Yes If no, delete pharmacy and type the correct one.   Has the prescription been filled recently? No  Is the patient out of the medication? Yes  Has the patient been seen for an appointment in the last year OR does the patient have an upcoming appointment? Yes  Can we respond through MyChart? Yes  Agent: Please be advised that Rx refills may take up to 3 business days. We ask that you follow-up with your pharmacy.

## 2024-04-19 ENCOUNTER — Ambulatory Visit (INDEPENDENT_AMBULATORY_CARE_PROVIDER_SITE_OTHER)

## 2024-04-19 DIAGNOSIS — Z23 Encounter for immunization: Secondary | ICD-10-CM

## 2024-05-08 ENCOUNTER — Ambulatory Visit (INDEPENDENT_AMBULATORY_CARE_PROVIDER_SITE_OTHER)

## 2024-05-08 VITALS — Ht 65.0 in | Wt 152.0 lb

## 2024-05-08 DIAGNOSIS — Z0000 Encounter for general adult medical examination without abnormal findings: Secondary | ICD-10-CM

## 2024-05-08 NOTE — Progress Notes (Signed)
 Chief Complaint  Patient presents with   Medicare Wellness     Subjective:   Karen Watson is a 83 y.o. female who presents for a Medicare Annual Wellness Visit.  Allergies (verified) Carbocaine [mepivacaine hcl], Codeine, Hydrocodone bit-homatrop mbr, Metrizamide, Propoxyphene n-acetaminophen , Robaxin [methocarbamol], Tape, Tramadol, Vicodin [hydrocodone-acetaminophen ], Cefazolin , Iodinated contrast media, Penicillins, Amoxil [amoxicillin], and Nsaids   History: Past Medical History:  Diagnosis Date   Arthritis    thumbs, neck, shoulders, lower back (06/08/2012)   Basal cell carcinoma 1995-present   face and back (06/08/2012)   Chronic bronchitis (HCC)    last winter   Chronic kidney disease    states she is stage 3    Complication of anesthesia 01/2003   developed Hives, vomiting, SOB, chest heaviness after gallbladder surgery was told they believe it was due to either ancef  or carbocaine   Constipation    Diarrhea    DJD (degenerative joint disease)    see Dr. Everlean   Family history of anesthesia complication    daughter had surgery that didn't require vent but she ended up on one but pt has no idea why.   Yzjijryz(215.9)    Dr. 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   right; inactive since 1999 (06/08/2012)   Lung nodules    multpile   Memory loss    Nocturia    Pneumonia    hx of about 5 yrs ago   PONV (postoperative nausea and vomiting)    Restless leg    takes tonic water at bedtime   Restless leg    Vertigo    doesn't take any meds   Weakness    numbness and tingling right leg   Past Surgical History:  Procedure Laterality Date   ANTERIOR LAT LUMBAR FUSION  06/08/2012   L2-3;  L3-4   ANTERIOR LAT LUMBAR FUSION  06/08/2012   Procedure: ANTERIOR LATERAL LUMBAR FUSION 2 LEVELS;  Surgeon: Alm GORMAN Molt, MD;  Location: [facility];  Service: Neurosurgery;  Laterality: Right;  Right Lumbar two-three, lumbar three-four extreme lateral interbody fusion with percutaneous pedicle screws   BASAL CELL CARCINOMA EXCISION  1995-2011   off back; @ least a couple/yr; Dr. Helga (06/08/2012)   BREAST BIOPSY  07/2013   left breast   CATARACT EXTRACTION W/ INTRAOCULAR LENS  IMPLANT, BILATERAL  2011   per Dr. Ilah in [place]   CHOLECYSTECTOMY  01/2003   colonscopy  08-03-13   per Dr. Albertus, clear, no repeats needed    dexa  01/2009   normal   DILATION AND CURETTAGE OF UTERUS  2000's   ESOPHAGOGASTRODUODENOSCOPY     LUMBAR LAMINECTOMY/DECOMPRESSION MICRODISCECTOMY Right 10/09/2015   Procedure: Right Lumbar Five-Sacral One Extraforaminal Foraminotomy with removal of synovial cyst;  Surgeon: Alm GORMAN Molt, MD;  Location: [facility];  Service: Neurosurgery;  Laterality: Right;   LUMBAR PERCUTANEOUS PEDICLE SCREW 1 LEVEL  06/08/2012   Procedure: LUMBAR PERCUTANEOUS PEDICLE SCREW 1 LEVEL;  Surgeon: Alm GORMAN Molt, MD;  Location: [facility];  Service: Neurosurgery;  Laterality: Right;  Right Lumbar two-three, lumbar three-four extreme lateral interbody fusion with percutaneous pedicle screws  LUNG LOBECTOMY  08/1997   RLL for carcinoid (06/08/2012)   TONSILLECTOMY  1947   TOTAL HIP ARTHROPLASTY Right 01/28/2016   Procedure: RIGHT TOTAL HIP ARTHROPLASTY ANTERIOR APPROACH;  Surgeon: Evalene JONETTA Chancy, MD;  Location: [facility];  Service: Orthopedics;  Laterality: Right;   TUBAL LIGATION  ~ 1976   Family History  Problem Relation Age of Onset   Multiple sclerosis Brother    Asthma Other    Coronary artery disease Other    Diabetes Other    Hypertension Other    Melanoma Other    Osteoporosis Other    Uterine cancer Other    Kidney cancer Other    Colon cancer Neg Hx    Social  History   Occupational History   Occupation: RETIRED  Tobacco Use   Smoking status: Never   Smokeless tobacco: Never  Vaping Use   Vaping status: Never Used  Substance and Sexual Activity   Alcohol use: No    Alcohol/week: 0.0 standard drinks of alcohol   Drug use: No   Sexual activity: Yes   Tobacco Counseling Counseling given: Not Answered  SDOH Screenings   Food Insecurity: No Food Insecurity (05/08/2024)  Housing: Low Risk  (05/08/2024)  Transportation Needs: No Transportation Needs (05/08/2024)  Utilities: Not At Risk (05/08/2024)  Alcohol Screen: Low Risk  (10/23/2021)  Depression (PHQ2-9): Low Risk  (05/08/2024)  Financial Resource Strain: Low Risk  (05/08/2024)  Physical Activity: Sufficiently Active (05/08/2024)  Social Connections: Moderately Integrated (05/08/2024)  Stress: No Stress Concern Present (05/08/2024)  Tobacco Use: Low Risk  (05/08/2024)  Health Literacy: Adequate Health Literacy (05/08/2024)   See flowsheets for full screening details  Depression Screen PHQ 2 & 9 Depression Scale- Over the past 2 weeks, how often have you been bothered by any of the following problems? Little interest or pleasure in doing things: 0 Feeling down, depressed, or hopeless (PHQ Adolescent also includes...irritable): 0 PHQ-2 Total Score: 0 Trouble falling or staying asleep, or sleeping too much: 0 Feeling tired or having little energy: 0 Poor appetite or overeating (PHQ Adolescent also includes...weight loss): 0 Feeling bad about yourself - or that you are a failure or have let yourself or your family down: 0 Trouble concentrating on things, such as reading the newspaper or watching television (PHQ Adolescent also includes...like school work): 0 Moving or speaking so slowly that other people could have noticed. Or the opposite - being so fidgety or restless that you have been moving around a lot more than usual: 0 Thoughts that you would be better off dead, or of hurting  yourself in some way: 0 PHQ-9 Total Score: 0 If you checked off any problems, how difficult have these problems made it for you to do your work, take care of things at home, or get along with other people?: Not difficult at all  Depression Treatment Depression Interventions/Treatment : EYV7-0 Score <4 Follow-up Not Indicated     Goals Addressed   None    Visit info / Clinical Intake: Medicare Wellness Visit Type:: Subsequent Annual Wellness Visit Persons participating in visit:: patient Medicare Wellness Visit Mode:: Video Because this visit was a virtual/telehealth visit:: vitals recorded from last visit If Telephone or Video please confirm:: I connected with the patient using audio enabled telemedicine application and verified that I am speaking with the correct person using two identifiers; I discussed the limitations of evaluation and management by telemedicine; The patient expressed understanding and agreed to proceed Patient Location:: home Provider Location:: home  Information given by:: patient Interpreter Needed?: No Pre-visit prep was completed: yes AWV questionnaire completed by patient prior to visit?: yes Date:: 05/08/24 Living arrangements:: in retirement community Patient's Overall Health Status Rating: very good Typical amount of pain: none Does pain affect daily life?: no Are you currently prescribed opioids?: no  Dietary Habits and Nutritional Risks How many meals a day?: 3 Eats fruit and vegetables daily?: yes Most meals are obtained by: having others provide food In the last 2 weeks, have you had any of the following?: none Diabetic:: no  Functional Status Activities of Daily Living (to include ambulation/medication): (Patient-Rptd) Independent Ambulation: (Patient-Rptd) Independent Medication Administration: Independent Home Management: (Patient-Rptd) Independent Manage your own finances?: (!) no Primary transportation is: driving Concerns about  vision?: no *vision screening is required for WTM* Concerns about hearing?: no  Fall Screening Falls in the past year?: (Patient-Rptd) 1 Number of falls in past year: (Patient-Rptd) 1 Was there an injury with Fall?: (Patient-Rptd) 1 Fall Risk Category Calculator: (Patient-Rptd) 3 Patient Fall Risk Level: (Patient-Rptd) High Fall Risk  Fall Risk Patient at Risk for Falls Due to: Impaired balance/gait; History of fall(s) Fall risk Follow up: Falls evaluation completed; Falls prevention discussed  Home and Transportation Safety: All rugs have non-skid backing?: N/A, no rugs All stairs or steps have railings?: N/A, no stairs Grab bars in the bathtub or shower?: yes Have non-skid surface in bathtub or shower?: yes Good home lighting?: yes Regular seat belt use?: yes Hospital stays in the last year:: no  Cognitive Assessment Difficulty concentrating, remembering, or making decisions? : no Will 6CIT or Mini Cog be Completed: yes What year is it?: 0 points What month is it?: 0 points Give patient an address phrase to remember (5 components): [address], [place] what time is it?: 0 points Count backwards from 20 to 1: 0 points Say the months of the year in reverse: 0 points Repeat the address phrase from earlier: 0 points 6 CIT Score: 0 points  Advance Directives (For Healthcare) Does Patient Have a Medical Advance Directive?: Yes  Reviewed/Updated  Reviewed/Updated: Reviewed All (Medical, Surgical, Family, Medications, Allergies, Care Teams, Patient Goals)        Objective:    Today's Vitals   05/08/24 0821  Weight: 152 lb (68.9 kg)  Height: 5' 5 (1.651 m)   Body mass index is 25.29 kg/m.  Current Medications (verified) Outpatient Encounter Medications as of 05/08/2024  Medication Sig   acetaminophen  (TYLENOL ) 500 MG tablet Take 1,000 mg by mouth 3 (three) times daily as needed for mild pain.    amLODipine  (NORVASC ) 5 MG tablet TAKE 1 TABLET BY MOUTH DAILY    b complex vitamins tablet Take 1 tablet by mouth daily.   Calcium Carb-Cholecalciferol (CALCIUM 600 + D PO) Take 600 mg by mouth 2 (two) times daily.   gabapentin  (NEURONTIN ) 300 MG capsule TAKE 1 CAPSULE BY MOUTH 3 TIMES  DAILY   MAGNESIUM-ZINC PO Take 1 tablet by mouth every other day.    meclizine  (ANTIVERT ) 25 MG tablet Take 1 tablet (25 mg total) by mouth every 4 (four) hours as needed for dizziness.   Melatonin 10 MG CAPS Take 10 mg by mouth.   MULTIPLE VITAMIN PO Take 1 tablet by mouth daily.   ondansetron  (ZOFRAN ) 8 MG tablet Take 1 tablet (8 mg total) by mouth every 8 (eight) hours as needed for nausea or vomiting.   potassium chloride  SA (KLOR-CON  M) 20 MEQ tablet TAKE 1 TABLET BY  MOUTH TWICE  DAILY   traZODone  (DESYREL ) 50 MG tablet Take 1.5 tablets (75 mg total) by mouth at bedtime.   triamterene -hydrochlorothiazide  (MAXZIDE -25) 37.5-25 MG tablet TAKE 1 TABLET BY MOUTH DAILY   urea  (CARMOL) 20 % cream Apply topically as needed.   vitamin E 400 UNIT capsule Take 400 Units by mouth daily.   No facility-administered encounter medications on file as of 05/08/2024.   Hearing/Vision screen Hearing Screening - Comments:: Denies hearing difficulties   Vision Screening - Comments:: Denies vision issues./Dr. Hutto/UTD  Immunizations and Health Maintenance Health Maintenance  Topic Date Due   COVID-19 Vaccine (1) Never done   Medicare Annual Wellness (AWV)  10/27/2023   Pneumococcal Vaccine: 50+ Years  Completed   Influenza Vaccine  Completed   DEXA SCAN  Completed   Zoster Vaccines- Shingrix  Completed   Meningococcal B Vaccine  Aged Out   DTaP/Tdap/Td  Discontinued        Assessment/Plan:  This is a routine wellness examination for Karen Watson.  Patient Care Team: Johnny Garnette LABOR, MD as PCP - General  I have personally reviewed and noted the following in the patient's chart:   Medical and social history Use of alcohol, tobacco or illicit drugs  Current medications and  supplements including opioid prescriptions. Functional ability and status Nutritional status Physical activity Advanced directives List of other physicians Hospitalizations, surgeries, and ER visits in previous 12 months Vitals Screenings to include cognitive, depression, and falls Referrals and appointments  No orders of the defined types were placed in this encounter.  In addition, I have reviewed and discussed with patient certain preventive protocols, quality metrics, and best practice recommendations. A written personalized care plan for preventive services as well as general preventive health recommendations were provided to patient.   Lynlee Stratton L Seaborn Nakama, CMA   05/08/2024   No follow-ups on file.  After Visit Summary: (MyChart) Due to this being a telephonic visit, the after visit summary with patients personalized plan was offered to patient via MyChart   Nurse Notes: Patient is up to date on all health maintenance with no concerns to address today.

## 2024-05-08 NOTE — Patient Instructions (Signed)
 Ms. Heenan,  Thank you for taking the time for your Medicare Wellness Visit. I appreciate your continued commitment to your health goals. Please review the care plan we discussed, and feel free to reach out if I can assist you further.  Please note that Annual Wellness Visits do not include a physical exam. Some assessments may be limited, especially if the visit was conducted virtually. If needed, we may recommend an in-person follow-up with your provider.  Ongoing Care Seeing your primary care provider every 3 to 6 months helps us  monitor your health and provide consistent, personalized care. Next office visit on 07/01/2023.  Keep up the good work.  Referrals If a referral was made during today's visit and you haven't received any updates within two weeks, please contact the referred provider directly to check on the status.  Recommended Screenings:  Health Maintenance  Topic Date Due   COVID-19 Vaccine (1) Never done   Medicare Annual Wellness Visit  05/08/2025   Pneumococcal Vaccine for age over 61  Completed   Flu Shot  Completed   DEXA scan (bone density measurement)  Completed   Zoster (Shingles) Vaccine  Completed   Meningitis B Vaccine  Aged Out   DTaP/Tdap/Td vaccine  Discontinued       05/08/2024    7:53 AM  Advanced Directives  Does Patient Have a Medical Advance Directive? Yes    Vision: Annual vision screenings are recommended for early detection of glaucoma, cataracts, and diabetic retinopathy. These exams can also reveal signs of chronic conditions such as diabetes and high blood pressure.  Dental: Annual dental screenings help detect early signs of oral cancer, gum disease, and other conditions linked to overall health, including heart disease and diabetes.  Please see the attached documents for additional preventive care recommendations.

## 2024-05-13 ENCOUNTER — Other Ambulatory Visit: Payer: Self-pay | Admitting: Family Medicine

## 2024-05-14 ENCOUNTER — Other Ambulatory Visit: Payer: Self-pay | Admitting: Family Medicine

## 2024-05-14 DIAGNOSIS — I10 Essential (primary) hypertension: Secondary | ICD-10-CM

## 2024-06-26 ENCOUNTER — Telehealth: Payer: Self-pay

## 2024-06-26 NOTE — Telephone Encounter (Signed)
 Copied from CRM [id number]. Topic: Referral - Request for Referral >> Jun 26, 2024  9:20 AM Berneda FALCON wrote: Did the patient discuss referral with their provider in the last year? No, but already had a dermatologist-needed a surgeon (If No - schedule appointment) (If Yes - send message)  Appointment offered? Yes  Type of order/referral and detailed reason for visit: dermatologist referred her to derm surgeon, but they need a PCP referral.   Preference of office, provider, location:  [facility] [address] [place], [place] [address] [phone] or [phone] (she was speaking to [facility])  If referral order, have you been seen by this specialty before? Yes (If Yes, this issue or another issue? When? Where?  Patient was seen with Dr.Carlin Hollar same location [facility])  Can we respond through MyChart? Yes

## 2024-06-29 NOTE — Telephone Encounter (Signed)
 Pt has appointment with Dr Johnny for this problem

## 2024-06-30 ENCOUNTER — Ambulatory Visit (INDEPENDENT_AMBULATORY_CARE_PROVIDER_SITE_OTHER): Admitting: Family Medicine

## 2024-06-30 ENCOUNTER — Encounter: Payer: Self-pay | Admitting: Family Medicine

## 2024-06-30 ENCOUNTER — Ambulatory Visit: Payer: Self-pay | Admitting: Family Medicine

## 2024-06-30 VITALS — BP 128/68 | HR 69 | Temp 98.2°F | Ht 65.0 in | Wt 150.0 lb

## 2024-06-30 DIAGNOSIS — M150 Primary generalized (osteo)arthritis: Secondary | ICD-10-CM | POA: Diagnosis not present

## 2024-06-30 DIAGNOSIS — C44310 Basal cell carcinoma of skin of unspecified parts of face: Secondary | ICD-10-CM | POA: Diagnosis not present

## 2024-06-30 DIAGNOSIS — R739 Hyperglycemia, unspecified: Secondary | ICD-10-CM

## 2024-06-30 DIAGNOSIS — K219 Gastro-esophageal reflux disease without esophagitis: Secondary | ICD-10-CM | POA: Diagnosis not present

## 2024-06-30 DIAGNOSIS — F418 Other specified anxiety disorders: Secondary | ICD-10-CM

## 2024-06-30 DIAGNOSIS — F5101 Primary insomnia: Secondary | ICD-10-CM

## 2024-06-30 DIAGNOSIS — M810 Age-related osteoporosis without current pathological fracture: Secondary | ICD-10-CM

## 2024-06-30 DIAGNOSIS — I10 Essential (primary) hypertension: Secondary | ICD-10-CM | POA: Diagnosis not present

## 2024-06-30 LAB — LIPID PANEL
Cholesterol: 177 mg/dL (ref 28–200)
HDL: 49.8 mg/dL
LDL Cholesterol: 102 mg/dL — ABNORMAL HIGH (ref 10–99)
NonHDL: 127.6
Total CHOL/HDL Ratio: 4
Triglycerides: 126 mg/dL (ref 10.0–149.0)
VLDL: 25.2 mg/dL (ref 0.0–40.0)

## 2024-06-30 LAB — CBC WITH DIFFERENTIAL/PLATELET
Basophils Absolute: 0.1 K/uL (ref 0.0–0.1)
Basophils Relative: 0.9 % (ref 0.0–3.0)
Eosinophils Absolute: 0.1 K/uL (ref 0.0–0.7)
Eosinophils Relative: 2.2 % (ref 0.0–5.0)
HCT: 42.1 % (ref 36.0–46.0)
Hemoglobin: 14.2 g/dL (ref 12.0–15.0)
Lymphocytes Relative: 28 % (ref 12.0–46.0)
Lymphs Abs: 1.5 K/uL (ref 0.7–4.0)
MCHC: 33.8 g/dL (ref 30.0–36.0)
MCV: 91.2 fl (ref 78.0–100.0)
Monocytes Absolute: 0.5 K/uL (ref 0.1–1.0)
Monocytes Relative: 9 % (ref 3.0–12.0)
Neutro Abs: 3.3 K/uL (ref 1.4–7.7)
Neutrophils Relative %: 59.9 % (ref 43.0–77.0)
Platelets: 233 K/uL (ref 150.0–400.0)
RBC: 4.61 Mil/uL (ref 3.87–5.11)
RDW: 14.2 % (ref 11.5–15.5)
WBC: 5.5 K/uL (ref 4.0–10.5)

## 2024-06-30 LAB — BASIC METABOLIC PANEL WITH GFR
BUN: 14 mg/dL (ref 6–23)
CO2: 30 meq/L (ref 19–32)
Calcium: 9.9 mg/dL (ref 8.4–10.5)
Chloride: 102 meq/L (ref 96–112)
Creatinine, Ser: 0.97 mg/dL (ref 0.40–1.20)
GFR: 54.45 mL/min — ABNORMAL LOW
Glucose, Bld: 98 mg/dL (ref 70–99)
Potassium: 4 meq/L (ref 3.5–5.1)
Sodium: 142 meq/L (ref 135–145)

## 2024-06-30 LAB — HEMOGLOBIN A1C: Hgb A1c MFr Bld: 6 % (ref 4.6–6.5)

## 2024-06-30 LAB — HEPATIC FUNCTION PANEL
ALT: 16 U/L (ref 3–35)
AST: 19 U/L (ref 5–37)
Albumin: 4.3 g/dL (ref 3.5–5.2)
Alkaline Phosphatase: 69 U/L (ref 39–117)
Bilirubin, Direct: 0.1 mg/dL (ref 0.1–0.3)
Total Bilirubin: 0.5 mg/dL (ref 0.2–1.2)
Total Protein: 6.7 g/dL (ref 6.0–8.3)

## 2024-06-30 LAB — TSH: TSH: 2.1 u[IU]/mL (ref 0.35–5.50)

## 2024-06-30 NOTE — Progress Notes (Signed)
 "  Subjective:    Patient ID: Karen Watson, female    DOB: 12-20-1940, 84 y.o.   MRN: [id number]  HPI Here to follow up on issues. She feels well. She sometimes has some leakage of urine when she coughs or sneezes, but this is not often. Her BP is stable. She is sleeping well. Her OA and GERD are stable. Her moods are stable. She was recently diagnosed with a basal cell carcinoma on her right cheek, and she is scheduled for a MOHS surgery to remove this. Her last DEXA was almost 3 years ago.    Review of Systems  Constitutional: Negative.   HENT: Negative.    Eyes: Negative.   Respiratory: Negative.    Cardiovascular: Negative.   Gastrointestinal: Negative.   Genitourinary:  Negative for decreased urine volume, difficulty urinating, dyspareunia, dysuria, enuresis, flank pain, frequency, hematuria, pelvic pain and urgency.  Musculoskeletal:  Positive for arthralgias.  Skin: Negative.   Neurological: Negative.  Negative for headaches.  Psychiatric/Behavioral: Negative.         Objective:   Physical Exam Constitutional:      General: She is not in acute distress.    Appearance: Normal appearance. She is well-developed.  HENT:     Head: Normocephalic and atraumatic.     Right Ear: External ear normal.     Left Ear: External ear normal.     Nose: Nose normal.     Mouth/Throat:     Pharynx: No oropharyngeal exudate.  Eyes:     General: No scleral icterus.    Conjunctiva/sclera: Conjunctivae normal.     Pupils: Pupils are equal, round, and reactive to light.  Neck:     Thyroid : No thyromegaly.     Vascular: No JVD.  Cardiovascular:     Rate and Rhythm: Normal rate and regular rhythm.     Pulses: Normal pulses.     Heart sounds: Normal heart sounds. No murmur heard.    No friction rub. No gallop.  Pulmonary:     Effort: Pulmonary effort is normal. No respiratory distress.     Breath sounds: Normal breath sounds. No wheezing or rales.  Chest:     Chest wall: No tenderness.   Abdominal:     General: Bowel sounds are normal. There is no distension.     Palpations: Abdomen is soft. There is no mass.     Tenderness: There is no abdominal tenderness. There is no guarding or rebound.  Musculoskeletal:        General: No tenderness. Normal range of motion.     Cervical back: Normal range of motion and neck supple.  Lymphadenopathy:     Cervical: No cervical adenopathy.  Skin:    General: Skin is warm and dry.     Findings: No erythema or rash.  Neurological:     General: No focal deficit present.     Mental Status: She is alert and oriented to person, place, and time.     Cranial Nerves: No cranial nerve deficit.     Motor: No abnormal muscle tone.     Coordination: Coordination normal.     Deep Tendon Reflexes: Reflexes are normal and symmetric. Reflexes normal.  Psychiatric:        Mood and Affect: Mood normal.        Behavior: Behavior normal.        Thought Content: Thought content normal.        Judgment: Judgment normal.  Assessment & Plan:  Her HTN and OA and GERD are stable. Her depression and anxiety are stable. Her insomnia is stable. We will refer her to Dermatology Surgery for the basal cell cancer. Set up a DEXA for the osteoporosis. I personally spent a total of 34 minutes in the care of the patient today including getting/reviewing separately obtained history, performing a medically appropriate exam/evaluation, placing orders, and coordinating care.  Garnette Olmsted, MD   "
